# Patient Record
Sex: Female | Born: 1970 | Hispanic: No | Marital: Married | Smoking: Never smoker
Health system: Southern US, Community
[De-identification: ages and names within clinical notes are randomized; demographics above are authoritative.]

## PROBLEM LIST (undated history)

## (undated) DIAGNOSIS — E559 Vitamin D deficiency, unspecified: Secondary | ICD-10-CM

## (undated) DIAGNOSIS — E538 Deficiency of other specified B group vitamins: Secondary | ICD-10-CM

## (undated) HISTORY — DX: Vitamin D deficiency, unspecified: E55.9

## (undated) HISTORY — PX: INNER EAR SURGERY: SHX679

## (undated) HISTORY — DX: Deficiency of other specified B group vitamins: E53.8

---

## 1998-03-19 ENCOUNTER — Inpatient Hospital Stay (HOSPITAL_COMMUNITY): Admission: AD | Admit: 1998-03-19 | Discharge: 1998-03-21 | Payer: Self-pay | Admitting: Obstetrics and Gynecology

## 1998-03-19 ENCOUNTER — Inpatient Hospital Stay (HOSPITAL_COMMUNITY): Admission: AD | Admit: 1998-03-19 | Discharge: 1998-03-19 | Payer: Self-pay | Admitting: Obstetrics and Gynecology

## 1999-11-29 ENCOUNTER — Other Ambulatory Visit: Admission: RE | Admit: 1999-11-29 | Discharge: 1999-11-29 | Payer: Self-pay | Admitting: *Deleted

## 2000-06-11 ENCOUNTER — Inpatient Hospital Stay (HOSPITAL_COMMUNITY): Admission: AD | Admit: 2000-06-11 | Discharge: 2000-06-13 | Payer: Self-pay | Admitting: Obstetrics and Gynecology

## 2002-01-21 ENCOUNTER — Other Ambulatory Visit: Admission: RE | Admit: 2002-01-21 | Discharge: 2002-01-21 | Payer: Self-pay | Admitting: Obstetrics and Gynecology

## 2002-08-05 ENCOUNTER — Inpatient Hospital Stay (HOSPITAL_COMMUNITY): Admission: AD | Admit: 2002-08-05 | Discharge: 2002-08-05 | Payer: Self-pay | Admitting: Obstetrics and Gynecology

## 2002-10-29 ENCOUNTER — Inpatient Hospital Stay (HOSPITAL_COMMUNITY): Admission: AD | Admit: 2002-10-29 | Discharge: 2002-10-31 | Payer: Self-pay | Admitting: Obstetrics and Gynecology

## 2003-10-25 ENCOUNTER — Other Ambulatory Visit: Admission: RE | Admit: 2003-10-25 | Discharge: 2003-10-25 | Payer: Self-pay | Admitting: Obstetrics and Gynecology

## 2004-06-21 ENCOUNTER — Inpatient Hospital Stay (HOSPITAL_COMMUNITY): Admission: AD | Admit: 2004-06-21 | Discharge: 2004-06-23 | Payer: Self-pay | Admitting: Obstetrics and Gynecology

## 2005-05-21 ENCOUNTER — Emergency Department (HOSPITAL_COMMUNITY): Admission: EM | Admit: 2005-05-21 | Discharge: 2005-05-21 | Payer: Self-pay | Admitting: Emergency Medicine

## 2005-11-29 ENCOUNTER — Other Ambulatory Visit: Admission: RE | Admit: 2005-11-29 | Discharge: 2005-11-29 | Payer: Self-pay | Admitting: Obstetrics and Gynecology

## 2006-01-08 ENCOUNTER — Ambulatory Visit (HOSPITAL_COMMUNITY): Admission: RE | Admit: 2006-01-08 | Discharge: 2006-01-08 | Payer: Self-pay | Admitting: Obstetrics and Gynecology

## 2016-07-16 ENCOUNTER — Ambulatory Visit: Payer: Self-pay | Admitting: Certified Nurse Midwife

## 2016-08-07 ENCOUNTER — Encounter: Payer: Self-pay | Admitting: Obstetrics & Gynecology

## 2016-08-07 ENCOUNTER — Ambulatory Visit (INDEPENDENT_AMBULATORY_CARE_PROVIDER_SITE_OTHER): Payer: Medicaid Other | Admitting: Obstetrics & Gynecology

## 2016-08-07 VITALS — BP 111/77 | HR 77 | Temp 98.3°F | Ht 66.0 in | Wt 168.0 lb

## 2016-08-07 DIAGNOSIS — N8110 Cystocele, unspecified: Secondary | ICD-10-CM | POA: Diagnosis not present

## 2016-08-07 DIAGNOSIS — N816 Rectocele: Secondary | ICD-10-CM | POA: Diagnosis not present

## 2016-08-07 DIAGNOSIS — Z3009 Encounter for other general counseling and advice on contraception: Secondary | ICD-10-CM | POA: Diagnosis not present

## 2016-08-07 DIAGNOSIS — N819 Female genital prolapse, unspecified: Secondary | ICD-10-CM

## 2016-08-07 DIAGNOSIS — IMO0002 Reserved for concepts with insufficient information to code with codable children: Secondary | ICD-10-CM

## 2016-08-07 DIAGNOSIS — IMO0001 Reserved for inherently not codable concepts without codable children: Secondary | ICD-10-CM

## 2016-08-07 NOTE — Progress Notes (Signed)
GYNECOLOGY CONSULT NOTE  History:  45 y.o. [id number] here today for two issues: 1) Possible prolapse of uterus. Patient has history of term SVD x 10, last one was 2 years ago. Since last delivery, she reports feeling a lot of pressure in her vaginal area and is concerned about her "uterus falling down".  She desires surgical management of this. No problems urinating or defecating, but feels very uncomfortable, especially during intercourse.  She denies any abnormal vaginal discharge, bleeding, pelvic pain or other concerns.  2) Undesired fertility: Desires sterilization. Using condoms currently.  No past medical history on file.  No past surgical history on file.  The following portions of the patient's history were reviewed and updated as appropriate: allergies, current medications, past family history, past medical history, past social history, past surgical history and problem list.   Health Maintenance:  Normal pap during her pregnancy 2 years ago in [place].  Review of Systems:  Pertinent items noted in HPI and remainder of comprehensive ROS otherwise negative.  Objective:  Physical Exam BP 111/77   Pulse 77   Temp 98.3 F (36.8 C) (Oral)   Ht 5\' 6"  (1.676 m)   Wt 168 lb (76.2 kg)   LMP 08/01/2016 (Approximate)   BMI 27.12 kg/m  CONSTITUTIONAL: Well-developed, well-nourished female in no acute distress.  HENT:  Normocephalic, atraumatic. External right and left ear normal. Oropharynx is clear and moist EYES: Conjunctivae and EOM are normal. Pupils are equal, round, and reactive to light. No scleral icterus.  NECK: Normal range of motion, supple, no masses SKIN: Skin is warm and dry. No rash noted. Not diaphoretic. No erythema. No pallor. NEUROLOGIC: Alert and oriented to person, place, and time. Normal reflexes, muscle tone coordination. No cranial nerve deficit noted. PSYCHIATRIC: Normal mood and affect. Normal behavior. Normal judgment and thought  content. CARDIOVASCULAR: Normal heart rate noted RESPIRATORY: Effort and breath sounds normal, no problems with respiration noted ABDOMEN: Soft, no distention noted.  MUSCULOSKELETAL: Normal range of motion. No edema noted.  PELVIC: Normal appearing external genitalia but Grade 3 rectocele immediately noted.  On speculum exam, Grade 1 cystocele also noted, no significant uterine prolapse.  Introitus is widened due to rectocele.  No abnormal discharge noted.  Normal uterine size, no other palpable masses, no uterine or adnexal tenderness.   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 Cystocele Explained pelvic organ prolapse in detail, discussed etiology as being due to pregnancy and childbirth. Showed patient images on computer to aid in her understanding. Given extent of her pelvic organ prolapse and her symptoms, recommended specialist referral to Dr. Maryland Pink, Urogynecology for surgical consultation. - Ambulatory referral to Urogynecology  4. Consultation for female sterilization Explained that if hysterectomy was going to be part of her pelvic organ prolapse surgery, tubal sterilization will not be needed. However, if hysterectomy will not be performed, laparoscopic BTS can be done at the same time to avoid having two separate OR dates and two separate episodes of general anesthesia.  Can coordinate with Dr. Zigmund Daniel if surgery is performed here at [facility] and I can do the BTS part. But if surgery is done in [facility], Dr. Zigmund Daniel or another generalist colleague can do the sterilization part. Will follow up Dr. West Pugh recommendations. Medicaid papers signed today in office.    Please refer to After Visit Summary for other counseling recommendations.    Total face-to-face time with patient: 30 minutes. 100% of encounter  was spent on counseling and coordination of care.   Verita Schneiders, MD, [place] Attending [place], [facility] for [employer], [place]

## 2016-08-07 NOTE — Patient Instructions (Signed)
RECTOCELE  Pelvic Organ Prolapse Pelvic organ prolapse is the stretching, bulging, or dropping of pelvic organs into an abnormal position. It happens when the muscles and tissues that surround and support pelvic structures are stretched or weak. Pelvic organ prolapse can involve:  Vagina (vaginal prolapse).  Uterus (uterine prolapse).  Bladder (cystocele). SMALL  Rectum (rectocele). BIG  Intestines (enterocele). When organs other than the vagina are involved, they often bulge into the vagina or protrude from the vagina, depending on how severe the prolapse is. CAUSES Causes of this condition include:  Pregnancy, labor, and childbirth.  Long-lasting (chronic) cough.  Chronic constipation.  Obesity.  Past pelvic surgery.  Aging. During and after menopause, a decreased production of the hormone estrogen can weaken pelvic ligaments and muscles.  Consistently lifting more than 50 lb (23 kg).  Buildup of fluid in the abdomen due to certain diseases and other conditions. SYMPTOMS Symptoms of this condition include:  Loss of bladder control when you cough, sneeze, strain, and exercise (stress incontinence). This Linda Villanueva be worse immediately following childbirth, and it Linda Villanueva gradually improve over time.  Feeling pressure in your pelvis or vagina. This pressure Linda Villanueva increase when you cough or when you are having a bowel movement.  A bulge that protrudes from the opening of your vagina or against your vaginal wall. If your uterus protrudes through the opening of your vagina and rubs against your clothing, you Linda Villanueva also experience soreness, ulcers, infection, pain, and bleeding.  Increased effort to have a bowel movement or urinate.  Pain in your low back.  Pain, discomfort, or disinterest in sexual intercourse.  Repeated bladder infections (urinary tract infections).  Difficulty inserting or inability to insert a tampon or applicator. In some people, this condition does not cause any  symptoms. DIAGNOSIS Your health care provider Linda Villanueva perform an internal and external vaginal and rectal exam. During the exam, you Linda Villanueva be asked to cough and strain while you are lying down, sitting, and standing up. Your health care provider will determine if other tests are required, such as bladder function tests. TREATMENT In most cases, this condition needs to be treated only if it produces symptoms. No treatment is guaranteed to correct the prolapse or relieve the symptoms completely. Treatment Linda Villanueva include:  Lifestyle changes, such as:  Avoiding drinking beverages that contain caffeine.  Increasing your intake of high-fiber foods. This can help to decrease constipation and straining during bowel movements.  Emptying your bladder at scheduled times (bladder training therapy). This can help to reduce or avoid urinary incontinence.  Losing weight if you are overweight or obese.  Estrogen. Estrogen Linda Villanueva help mild prolapse by increasing the strength and tone of pelvic floor muscles.  Kegel exercises. These Linda Villanueva help mild cases of prolapse by strengthening and tightening the muscles of the pelvic floor.  Pessary insertion. A pessary is a soft, flexible device that is placed into your vagina by your health care provider to help support the vaginal walls and keep pelvic organs in place.  Surgery. This is often the only form of treatment for severe prolapse. Different types of surgeries are available. HOME CARE INSTRUCTIONS  Wear a sanitary pad or absorbent product if you have urinary incontinence.  Avoid heavy lifting and straining with exercise and work. Do not hold your breath when you perform mild to moderate lifting and exercise activities. Limit your activities as directed by your health care provider.  Take medicines only as directed by your health care provider.  Perform Kegel exercises  as directed by your health care provider.  If you have a pessary, take care of it as directed by  your health care provider. SEEK MEDICAL CARE IF:  Your symptoms interfere with your daily activities or sex life.  You need medicine to help with the discomfort.  You notice bleeding from the vagina that is not related to your period.  You have a fever.  You have pain or bleeding when you urinate.  You have bleeding when you have a bowel movement.  You lose urine when you have sex.  You have chronic constipation.  You have a pessary that falls out.  You have vaginal discharge that has a bad smell.  You have low abdominal pain or cramping that is unusual for you.   This information is not intended to replace advice given to you by your health care provider. Make sure you discuss any questions you have with your health care provider.   Document Released: 06/01/2014 Document Reviewed: 06/01/2014 Elsevier Interactive Patient Education [employer].     Laparoscopic Tubal Ligation Laparoscopic tubal ligation is a procedure that closes the fallopian tubes at a time other than right after childbirth. When the fallopian tubes are closed, the eggs that are released from the ovaries cannot enter the uterus, and sperm cannot reach the egg. Tubal ligation is also known as getting your "tubes tied." Tubal ligation is done so you will not be able to get pregnant or have a baby. Although this procedure Linda Villanueva be undone (reversed), it should be considered permanent and irreversible. If you want to have future pregnancies, you should not have this procedure. LET Linda Villanueva CARE PROVIDER KNOW ABOUT:  Any allergies you have.  All medicines you are taking, including vitamins, herbs, eye drops, creams, and over-the-counter medicines. This includes any use of steroids, either by mouth or in cream form.  Previous problems you or members of your family have had with the use of anesthetics.  Any blood disorders you have.  Previous surgeries you have had.  Any medical conditions you Linda Villanueva  have.  Possibility of pregnancy, if this applies.  Any past pregnancies. RISKS AND COMPLICATIONS  Infection.  Bleeding.  Injury to surrounding organs.  Side effects from anesthetics.  Failure of the procedure.  Ectopic pregnancy.  Future regret about having the procedure done. BEFORE THE PROCEDURE  Ask your health care provider about:  Changing or stopping your regular medicines. This is especially important if you are taking diabetes medicines or blood thinners.  Taking medicines such as aspirin and ibuprofen. These medicines can thin your blood. Do not take these medicines before your procedure if your health care provider instructs you not to.  Follow instructions from your health care provider about eating and drinking restrictions.  Plan to have someone take you home after the procedure.  If you go home right after the procedure, plan to have someone with you for 24 hours. PROCEDURE  You will be given one or more of the following:  A medicine that helps you relax (sedative).  A medicine that numbs the area (local anesthetic).  A medicine that makes you fall asleep (general anesthetic).  A medicine that is injected into an area of your body that numbs everything below the injection site (regional anesthetic).  If you have been given general anesthetic, a tube will be put down your throat to help you breathe.  Two small cuts (incisions) will be made in the lower abdominal area and near the  belly button.  Your bladder Linda Villanueva be emptied with a small tube (catheter).  Your abdomen will be inflated with a safe gas (carbon dioxide). This will help to give the surgeon room to operate and visualize, and it will help the surgeon to avoid other organs.  A thin, lighted tube (laparoscope) with a camera attached will be inserted into your abdomen through one of the incisions near the belly button. Other small instruments will be inserted through the other abdominal  incision.  The fallopian tubes will be tied off or burned (cauterized), or they will be blocked with a clip, ring, or clamp. In many cases, a small portion in the center of each fallopian tube will also be removed.  After the fallopian tubes are blocked, the gas will be released from the abdomen.  The incisions will be closed with stitches (sutures).  A bandage (dressing) will be placed over the incisions. The procedure Linda Villanueva vary among health care providers and hospitals. AFTER THE PROCEDURE  Your blood pressure, heart rate, breathing rate, and blood oxygen level will be monitored often until the medicines you were given have worn off.  You will be given pain medicine as needed.  If you had general anesthetic, you Linda Villanueva have some mild discomfort in your throat. This is from the breathing tube that was placed in your throat while you were sleeping.  You Linda Villanueva experience discomfort in the shoulder area from some trapped air between your liver and your diaphragm. This sensation is normal, and it will slowly go away on its own.  You will have some mild abdominal discomfort for 3--7 days.   This information is not intended to replace advice given to you by your health care provider. Make sure you discuss any questions you have with your health care provider.   Document Released: 02/10/2001 Document Revised: 03/21/2015 Document Reviewed: 02/15/2012 Elsevier Interactive Patient Education [employer].

## 2016-09-18 DIAGNOSIS — N393 Stress incontinence (female) (male): Secondary | ICD-10-CM | POA: Insufficient documentation

## 2016-09-18 DIAGNOSIS — Z30430 Encounter for insertion of intrauterine contraceptive device: Secondary | ICD-10-CM | POA: Insufficient documentation

## 2017-02-28 DIAGNOSIS — H40033 Anatomical narrow angle, bilateral: Secondary | ICD-10-CM | POA: Diagnosis not present

## 2017-02-28 DIAGNOSIS — H16223 Keratoconjunctivitis sicca, not specified as Sjogren's, bilateral: Secondary | ICD-10-CM | POA: Diagnosis not present

## 2017-03-05 ENCOUNTER — Encounter (HOSPITAL_COMMUNITY): Payer: Self-pay | Admitting: Emergency Medicine

## 2017-03-05 ENCOUNTER — Ambulatory Visit (HOSPITAL_COMMUNITY)
Admission: EM | Admit: 2017-03-05 | Discharge: 2017-03-05 | Disposition: A | Discharge status: Home / Self Care | Payer: Medicaid Other

## 2017-03-05 DIAGNOSIS — R112 Nausea with vomiting, unspecified: Secondary | ICD-10-CM

## 2017-03-05 DIAGNOSIS — Z3202 Encounter for pregnancy test, result negative: Secondary | ICD-10-CM | POA: Diagnosis not present

## 2017-03-05 DIAGNOSIS — B9681 Helicobacter pylori [H. pylori] as the cause of diseases classified elsewhere: Secondary | ICD-10-CM | POA: Diagnosis not present

## 2017-03-05 DIAGNOSIS — K253 Acute gastric ulcer without hemorrhage or perforation: Secondary | ICD-10-CM

## 2017-03-05 DIAGNOSIS — R109 Unspecified abdominal pain: Secondary | ICD-10-CM | POA: Diagnosis not present

## 2017-03-05 LAB — POCT URINALYSIS DIP (DEVICE)
BILIRUBIN URINE: NEGATIVE
GLUCOSE, UA: NEGATIVE mg/dL
Ketones, ur: NEGATIVE mg/dL
LEUKOCYTES UA: NEGATIVE
NITRITE: NEGATIVE
Protein, ur: NEGATIVE mg/dL
Specific Gravity, Urine: 1.025 (ref 1.005–1.030)
UROBILINOGEN UA: 0.2 mg/dL (ref 0.0–1.0)
pH: 7 (ref 5.0–8.0)

## 2017-03-05 LAB — POCT PREGNANCY, URINE: PREG TEST UR: NEGATIVE

## 2017-03-05 MED ORDER — OMEPRAZOLE 20 MG PO CPDR: 20.0000 mg | DELAYED_RELEASE_CAPSULE | Freq: Two times a day (BID) | ORAL | 1 refills | Status: DC

## 2017-03-05 MED ORDER — KETOROLAC TROMETHAMINE 30 MG/ML IJ SOLN
INTRAMUSCULAR | Status: AC
  Filled 2017-03-05: qty 1

## 2017-03-05 MED ORDER — ONDANSETRON HCL 4 MG/2ML IJ SOLN
4.0000 mg | Freq: Once | INTRAMUSCULAR | Status: AC
  Administered 2017-03-05: 4 mg via INTRAMUSCULAR

## 2017-03-05 MED ORDER — KETOROLAC TROMETHAMINE 30 MG/ML IJ SOLN
30.0000 mg | Freq: Once | INTRAMUSCULAR | Status: AC
  Administered 2017-03-05: 30 mg via INTRAMUSCULAR

## 2017-03-05 MED ORDER — CLARITHROMYCIN 500 MG PO TABS: 500.0000 mg | ORAL_TABLET | Freq: Two times a day (BID) | ORAL | 0 refills | Status: AC

## 2017-03-05 MED ORDER — AMOXICILLIN 500 MG PO CAPS: 1000.0000 mg | ORAL_CAPSULE | Freq: Two times a day (BID) | ORAL | 0 refills | Status: AC

## 2017-03-05 MED ORDER — ONDANSETRON HCL 4 MG/2ML IJ SOLN
INTRAMUSCULAR | Status: AC
  Filled 2017-03-05: qty 2

## 2017-03-05 NOTE — ED Provider Notes (Signed)
CSN: [id number]     Arrival date & time 03/05/17  1632 History   First MD Initiated Contact with Patient 03/05/17 1727     Chief Complaint  Patient presents with  . Abdominal Pain   (Consider location/radiation/quality/duration/timing/severity/associated sxs/prior Treatment) 46 year old female presents with epigastric abdominal pain that started 2 days ago. Has become more severe in the past 24 hours with nausea, vomiting and diarrhea. Denies any fever, dysuria, unusual vaginal discharge or blood in her stool or vomit. She does have a history of a stomach ulcer about 2 years ago and symptoms are similar to that episode. Has not been able to keep any fluids down since this morning. Pain level is 9/10 and she is hunched over due to the pain. She has not tried any medication for symptoms. No other family members are ill. She is accompanied by her husband who is assisting with translation as needed.    The history is provided by the patient and the spouse.    History reviewed. No pertinent past medical history. History reviewed. No pertinent surgical history. No family history on file. Social History  Substance Use Topics  . Smoking status: Never Smoker  . Smokeless tobacco: Not on file  . Alcohol use No   OB History    Gravida Para Term Preterm AB Living   10 10 10  0 0 0   SAB TAB Ectopic Multiple Live Births   0 0 0 0 0     Review of Systems  Constitutional: Positive for appetite change and fatigue. Negative for chills and fever.  HENT: Negative for congestion and sore throat.   Respiratory: Negative for cough, chest tightness, shortness of breath and wheezing.   Cardiovascular: Negative for chest pain.  Gastrointestinal: Positive for abdominal pain, diarrhea, nausea and vomiting. Negative for blood in stool, constipation and rectal pain.  Genitourinary: Negative for dysuria, flank pain and vaginal discharge.  Musculoskeletal: Negative for arthralgias, back pain, myalgias and neck  pain.  Skin: Negative for rash and wound.  Allergic/Immunologic: Negative for environmental allergies, food allergies and immunocompromised state.  Neurological: Negative for dizziness, syncope, weakness, light-headedness and headaches.  Hematological: Negative for adenopathy.    Allergies  Patient has no known allergies.  Home Medications   Prior to Admission medications   Medication Sig Start Date End Date Taking? Authorizing Provider  Multiple Vitamin (MULTIVITAMIN) tablet Take 1 tablet by mouth daily.   Yes Historical Provider, MD  amoxicillin (AMOXIL) 500 MG capsule Take 2 capsules (1,000 mg total) by mouth 2 (two) times daily. 03/05/17 03/19/17  Katy Apo, NP  clarithromycin (BIAXIN) 500 MG tablet Take 1 tablet (500 mg total) by mouth 2 (two) times daily. 03/05/17 03/19/17  Katy Apo, NP  omeprazole (PRILOSEC) 20 MG capsule Take 1 capsule (20 mg total) by mouth 2 (two) times daily before a meal. 03/05/17 03/19/17  Katy Apo, NP   Meds Ordered and Administered this Visit   Medications  ondansetron [facility]) injection 4 mg (4 mg Intramuscular Given 03/05/17 1808)  ketorolac (TORADOL) 30 MG/ML injection 30 mg (30 mg Intramuscular Given 03/05/17 1809)    BP 126/82 (BP Location: Left Arm)   Pulse 76   Temp 97.9 F (36.6 C) (Oral)   Resp 18   LMP 02/20/2017   SpO2 100%  No data found.   Physical Exam  Constitutional: She is oriented to person, place, and time. She appears well-developed and well-nourished. She appears ill (Patient is hunched over due  to pain). She appears distressed.  HENT:  Head: Normocephalic and atraumatic.  Right Ear: Hearing normal.  Left Ear: Hearing normal.  Nose: Nose normal.  Mouth/Throat: Uvula is midline, oropharynx is clear and moist and mucous membranes are normal.  Eyes: Conjunctivae and EOM are normal. Pupils are equal, round, and reactive to light.  Neck: Normal range of motion. Neck supple.  Cardiovascular: Normal rate, regular  rhythm and normal heart sounds.   No murmur heard. Pulmonary/Chest: Effort normal and breath sounds normal. No respiratory distress. She has no wheezes.  Abdominal: Soft. Normal appearance. She exhibits no distension, no abdominal bruit, no ascites and no mass. Bowel sounds are increased. There is no hepatosplenomegaly. There is tenderness in the epigastric area. There is no rigidity, no rebound, no guarding and no CVA tenderness.  Musculoskeletal: Normal range of motion.  Lymphadenopathy:    She has no cervical adenopathy.  Neurological: She is alert and oriented to person, place, and time.  Skin: Skin is warm and dry.  Psychiatric: She has a normal mood and affect. Her behavior is normal.    Urgent Care Course     Procedures (including critical care time)  Labs Review Labs Reviewed  POCT URINALYSIS DIP (DEVICE) - Abnormal; Notable for the following:       Result Value   Hgb urine dipstick MODERATE (*)    All other components within normal limits  POCT PREGNANCY, URINE    Imaging Review No results found.   Visual Acuity Review  Right Eye Distance:   Left Eye Distance:   Bilateral Distance:    Right Eye Near:   Left Eye Near:    Bilateral Near:         MDM   1. Acute H. pylori gastric ulcer    Reviewed negative pregnancy test with patient and husband. Discussed urinalysis results. Reviewed positive H pylori test result- discussed gastric ulcer and treatment. Due to nausea, gave Zofran 4mg  IM today. Also due to pain and unable to keep oral medication down, gave Toradol 30mg  IM now. Recommend triple therapy- start Biaxin 500mg  twice a day, Amoxicillin 1g twice a day and Prilosec 20mg  twice a day. Also discussed using Peptobismul up to 4 times a day to help with nausea and diarrhea. Recommend contact [facility] tomorrow to schedule follow-up appointment or go to ER if pain or symptoms do not improve or worsens.     Katy Apo, NP 03/06/17 [phone]

## 2017-03-05 NOTE — ED Triage Notes (Signed)
Patient had abdominal pain that started a few days ago, significantly worsened today.  Patient is having vomiting and diarrhea.

## 2017-03-05 NOTE — Discharge Instructions (Signed)
You were given a shot of Zofran for nausea today as well as Toradol for pain. Recommend start antibiotics for bacterial stomach infection. Start Clarithromycin 500mg  twice a day and Amoxicillin 2 tablets twice a day. Also take Prilosec 20mg  capsule twice a day. Elvera also use Peptobismul to help coat stomach lining to help with pain. Recommend call [facility] office tomorrow to schedule follow-up appointment or go to ER if pain or symptoms worsen.

## 2017-03-06 ENCOUNTER — Emergency Department (HOSPITAL_COMMUNITY)
Admission: EM | Admit: 2017-03-06 | Discharge: 2017-03-06 | Disposition: A | Discharge status: Home / Self Care | Payer: Medicaid Other | Attending: Emergency Medicine | Admitting: Emergency Medicine

## 2017-03-06 ENCOUNTER — Encounter (HOSPITAL_COMMUNITY): Payer: Self-pay | Admitting: *Deleted

## 2017-03-06 DIAGNOSIS — Z79899 Other long term (current) drug therapy: Secondary | ICD-10-CM | POA: Diagnosis not present

## 2017-03-06 DIAGNOSIS — R1013 Epigastric pain: Secondary | ICD-10-CM | POA: Diagnosis not present

## 2017-03-06 DIAGNOSIS — R112 Nausea with vomiting, unspecified: Secondary | ICD-10-CM | POA: Insufficient documentation

## 2017-03-06 LAB — COMPREHENSIVE METABOLIC PANEL
ALT: 14 U/L (ref 14–54)
AST: 22 U/L (ref 15–41)
Albumin: 3.9 g/dL (ref 3.5–5.0)
Alkaline Phosphatase: 85 U/L (ref 38–126)
Anion gap: 8 (ref 5–15)
BILIRUBIN TOTAL: 1.1 mg/dL (ref 0.3–1.2)
BUN: 9 mg/dL (ref 6–20)
CHLORIDE: 105 mmol/L (ref 101–111)
CO2: 26 mmol/L (ref 22–32)
CREATININE: 0.66 mg/dL (ref 0.44–1.00)
Calcium: 9.1 mg/dL (ref 8.9–10.3)
Glucose, Bld: 117 mg/dL — ABNORMAL HIGH (ref 65–99)
POTASSIUM: 3.8 mmol/L (ref 3.5–5.1)
Sodium: 139 mmol/L (ref 135–145)
TOTAL PROTEIN: 7.7 g/dL (ref 6.5–8.1)

## 2017-03-06 LAB — URINALYSIS, ROUTINE W REFLEX MICROSCOPIC
Bacteria, UA: NONE SEEN
Bilirubin Urine: NEGATIVE
GLUCOSE, UA: NEGATIVE mg/dL
Ketones, ur: 20 mg/dL — AB
Leukocytes, UA: NEGATIVE
NITRITE: NEGATIVE
PH: 6 (ref 5.0–8.0)
Protein, ur: 30 mg/dL — AB
SPECIFIC GRAVITY, URINE: 1.024 (ref 1.005–1.030)

## 2017-03-06 LAB — I-STAT BETA HCG BLOOD, ED (MC, WL, AP ONLY): I-stat hCG, quantitative: <5 m[IU]/mL (ref <5)

## 2017-03-06 LAB — CBC WITH DIFFERENTIAL/PLATELET
Basophils Absolute: 0 10*3/uL (ref 0.0–0.1)
Basophils Relative: 0 %
EOS ABS: 0 10*3/uL (ref 0.0–0.7)
Eosinophils Relative: 0 %
HEMATOCRIT: 39.2 % (ref 36.0–46.0)
HEMOGLOBIN: 12.8 g/dL (ref 12.0–15.0)
LYMPHS ABS: 2.4 10*3/uL (ref 0.7–4.0)
LYMPHS PCT: 20 %
MCH: 26.4 pg (ref 26.0–34.0)
MCHC: 32.7 g/dL (ref 30.0–36.0)
MCV: 81 fL (ref 78.0–100.0)
MONOS PCT: 5 %
Monocytes Absolute: 0.6 10*3/uL (ref 0.1–1.0)
NEUTROS ABS: 9.2 10*3/uL — AB (ref 1.7–7.7)
NEUTROS PCT: 75 %
Platelets: 401 10*3/uL — ABNORMAL HIGH (ref 150–400)
RBC: 4.84 MIL/uL (ref 3.87–5.11)
RDW: 14.9 % (ref 11.5–15.5)
WBC: 12.1 10*3/uL — AB (ref 4.0–10.5)

## 2017-03-06 LAB — LIPASE, BLOOD: LIPASE: 27 U/L (ref 11–51)

## 2017-03-06 MED ORDER — ONDANSETRON 4 MG PO TBDP
4.0000 mg | ORAL_TABLET | Freq: Once | ORAL | Status: AC
  Administered 2017-03-06: 4 mg via ORAL
  Filled 2017-03-06: qty 1

## 2017-03-06 MED ORDER — ONDANSETRON 4 MG PO TBDP: 4.0000 mg | ORAL_TABLET | Freq: Three times a day (TID) | ORAL | 0 refills | Status: AC | PRN

## 2017-03-06 NOTE — ED Triage Notes (Signed)
Pt reports mid abd pain that became severe yesterday. Was seen at [facility] yesterday and started on prescriptions for bacteria. Pt having n/v/d and unable to keep meds down.

## 2017-03-06 NOTE — ED Provider Notes (Signed)
[facility] DEPT Provider Note   CSN: [id number] Arrival date & time: 03/06/17  1220     History   Chief Complaint Chief Complaint  Patient presents with  . Abdominal Pain  . Emesis    HPI Linda Villanueva is a 46 y.o. female.  The history is provided by the patient.  Abdominal Pain   This is a recurrent (on going for several years.) problem. Episode onset: this episode about 3 days. Episode frequency: intermittent. The problem has been gradually worsening. The pain is associated with eating. The pain is located in the epigastric region and RUQ. The pain is moderate. Associated symptoms include diarrhea (intermittent that has been on going for several months), nausea and vomiting. Pertinent negatives include fever, melena and dysuria. The symptoms are aggravated by eating and palpation. Nothing relieves the symptoms. Her past medical history is significant for PUD (told she had H. pylori in [place]). Past medical history comments: s/p cholecystectomy. .  Emesis   Associated symptoms include abdominal pain and diarrhea (intermittent that has been on going for several months). Pertinent negatives include no fever.    Patient and the husband reported that she's not been able to keep her medicine down due to her nausea and vomiting.  History reviewed. No pertinent past medical history.  There are no active problems to display for this patient.   History reviewed. No pertinent surgical history.  OB History    Gravida Para Term Preterm AB Living   10 10 10  0 0 0   SAB TAB Ectopic Multiple Live Births   0 0 0 0 0       Home Medications    Prior to Admission medications   Medication Sig Start Date End Date Taking? Authorizing Provider  amoxicillin (AMOXIL) 500 MG capsule Take 2 capsules (1,000 mg total) by mouth 2 (two) times daily. 03/05/17 03/19/17 Yes Katy Apo, NP  clarithromycin (BIAXIN) 500 MG tablet Take 1 tablet (500 mg total) by mouth 2 (two) times daily. 03/05/17  03/19/17 Yes Katy Apo, NP  Multiple Vitamin (MULTIVITAMIN) tablet Take 1 tablet by mouth daily.   Yes Historical Provider, MD  omeprazole (PRILOSEC) 20 MG capsule Take 1 capsule (20 mg total) by mouth 2 (two) times daily before a meal. 03/05/17 03/19/17 Yes Katy Apo, NP  ondansetron (ZOFRAN ODT) 4 MG disintegrating tablet Take 1 tablet (4 mg total) by mouth every 8 (eight) hours as needed for nausea or vomiting. 03/06/17 03/09/17  Fatima Blank, MD    Family History History reviewed. No pertinent family history.  Social History Social History  Substance Use Topics  . Smoking status: Never Smoker  . Smokeless tobacco: Not on file  . Alcohol use No     Allergies   Patient has no known allergies.   Review of Systems Review of Systems  Constitutional: Negative for fever.  Gastrointestinal: Positive for abdominal pain, diarrhea (intermittent that has been on going for several months), nausea and vomiting. Negative for melena.  Genitourinary: Negative for dysuria.  All other systems are reviewed and are negative for acute change except as noted in the HPI    Physical Exam Updated Vital Signs BP 123/86   Pulse 79   Temp 97.9 F (36.6 C) (Oral)   Resp 18   LMP 02/20/2017   SpO2 98%   Physical Exam  Constitutional: She is oriented to person, place, and time. She appears well-developed and well-nourished. No distress.  HENT:  Head: Normocephalic and  atraumatic.  Nose: Nose normal.  Eyes: Conjunctivae and EOM are normal. Pupils are equal, round, and reactive to light. Right eye exhibits no discharge. Left eye exhibits no discharge. No scleral icterus.  Neck: Normal range of motion. Neck supple.  Cardiovascular: Normal rate and regular rhythm.  Exam reveals no gallop and no friction rub.   No murmur heard. Pulmonary/Chest: Effort normal and breath sounds normal. No stridor. No respiratory distress. She has no rales.  Abdominal: Soft. She exhibits no distension.  There is tenderness in the right upper quadrant and epigastric area. There is no rigidity, no rebound and no guarding.  Musculoskeletal: She exhibits no edema or tenderness.  Neurological: She is alert and oriented to person, place, and time.  Skin: Skin is warm and dry. No rash noted. She is not diaphoretic. No erythema.  Psychiatric: She has a normal mood and affect.  Vitals reviewed.    ED Treatments / Results  Labs (all labs ordered are listed, but only abnormal results are displayed) Labs Reviewed  COMPREHENSIVE METABOLIC PANEL - Abnormal; Notable for the following:       Result Value   Glucose, Bld 117 (*)    All other components within normal limits  URINALYSIS, ROUTINE W REFLEX MICROSCOPIC - Abnormal; Notable for the following:    APPearance HAZY (*)    Hgb urine dipstick MODERATE (*)    Ketones, ur 20 (*)    Protein, ur 30 (*)    Squamous Epithelial / LPF 0-5 (*)    All other components within normal limits  CBC WITH DIFFERENTIAL/PLATELET - Abnormal; Notable for the following:    WBC 12.1 (*)    Platelets 401 (*)    Neutro Abs 9.2 (*)    All other components within normal limits  LIPASE, BLOOD  I-STAT BETA HCG BLOOD, ED ([facility], [facility], [facility] ONLY)    EKG  EKG Interpretation None       Radiology No results found.  Procedures Procedures (including critical care time)  Medications Ordered in ED Medications  ondansetron (ZOFRAN-ODT) disintegrating tablet 4 mg (4 mg Oral Given 03/06/17 1630)     Initial Impression / Assessment and Plan / ED Course  I have reviewed the triage vital signs and the nursing notes.  Pertinent labs & imaging results that were available during my care of the patient were reviewed by me and considered in my medical decision making (see chart for details).     Epigastric abdominal pain. Patient is currently being treated for H. pylori. On review of the records I do not see a positive H. pylori test but the patient reports that she was told  previously that she had it while in [place].  Abdominal exam without evidence of peritonitis. Labs grossly reassuring without evidence to suggest pancreatitis, biliary obstruction. Likely gastritis versus peptic ulcer disease. Patient denied any melena to suggest active bleeding.   Provided with Zofran and patient was able to tolerate by mouth intake.  Improved pain as well.   There is low suspicion for other serious/emergent intra-abdominal etiologies. Do not feel that advanced imaging is warranted at this time.   Feel patient is safe for discharge with strict return precautions and close PCP follow-up.  Final Clinical Impressions(s) / ED Diagnoses   Final diagnoses:  Epigastric pain  Nausea and vomiting, intractability of vomiting not specified, unspecified vomiting type   Disposition: Discharge  Condition: Good  I have discussed the results, Dx and Tx plan with the patient who expressed understanding  and agree(s) with the plan. Discharge instructions discussed at great length. The patient was given strict return precautions who verbalized understanding of the instructions. No further questions at time of discharge.    New Prescriptions   ONDANSETRON (ZOFRAN ODT) 4 MG DISINTEGRATING TABLET    Take 1 tablet (4 mg total) by mouth every 8 (eight) hours as needed for nausea or vomiting.    Follow Up: primary care provider  Schedule an appointment as soon as possible for a visit        Fatima Blank, MD 03/06/17 [phone]

## 2017-03-06 NOTE — ED Notes (Signed)
Pt ambulated to restroom, pt had no complaints at this time.

## 2017-03-07 LAB — POCT H PYLORI SCREEN: H. PYLORI SCREEN, POC: POSITIVE — AB

## 2017-11-13 ENCOUNTER — Other Ambulatory Visit (HOSPITAL_COMMUNITY)
Admission: RE | Admit: 2017-11-13 | Discharge: 2017-11-13 | Disposition: A | Discharge status: Home / Self Care | Payer: Medicaid Other | Source: Ambulatory Visit

## 2017-11-13 ENCOUNTER — Ambulatory Visit: Payer: Medicaid Other

## 2017-11-13 VITALS — BP 112/72 | HR 69 | Wt 166.8 lb

## 2017-11-13 DIAGNOSIS — B372 Candidiasis of skin and nail: Secondary | ICD-10-CM | POA: Diagnosis not present

## 2017-11-13 DIAGNOSIS — Z975 Presence of (intrauterine) contraceptive device: Secondary | ICD-10-CM | POA: Insufficient documentation

## 2017-11-13 DIAGNOSIS — Z01419 Encounter for gynecological examination (general) (routine) without abnormal findings: Secondary | ICD-10-CM | POA: Insufficient documentation

## 2017-11-13 DIAGNOSIS — Z0000 Encounter for general adult medical examination without abnormal findings: Secondary | ICD-10-CM | POA: Diagnosis not present

## 2017-11-13 MED ORDER — FLUCONAZOLE 150 MG PO TABS: 150.0000 mg | ORAL_TABLET | Freq: Once | ORAL | 0 refills | Status: AC

## 2017-11-13 MED ORDER — HYDROCORTISONE 0.5 % EX CREA: 1.0000 "application " | TOPICAL_CREAM | Freq: Two times a day (BID) | CUTANEOUS | 0 refills | Status: DC

## 2017-11-13 NOTE — Patient Instructions (Signed)
Diphenhydramine cream- for itching  Ask primary doctor for referral to dermatology

## 2017-11-13 NOTE — Progress Notes (Signed)
GYNECOLOGY ANNUAL PREVENTATIVE CARE ENCOUNTER NOTE  Subjective:   Linda Villanueva is a 46 y.o. [id number] female here for a routine annual gynecologic exam.  Current complaints: external itching around vagina.   Denies abnormal vaginal bleeding, discharge, pelvic pain, problems with intercourse or other gynecologic concerns.    Gynecologic History Patient's last menstrual period was 11/04/2017. Contraception: IUD Last Pap: unsure, from [place] and states it was a long time ago.  Obstetric History OB History  Gravida Para Term Preterm AB Living  10 10 10  0 0 10  SAB TAB Ectopic Multiple Live Births  0 0 0 0 10    # Outcome Date GA Lbr Len/2nd Weight Sex Delivery Anes PTL Lv  10 Term           9 Term           8 Term           7 Term           6 Term           5 Term           4 Term           3 Term           2 Term           1 Term               History reviewed. No pertinent past medical history.  History reviewed. No pertinent surgical history.  Current Outpatient Medications on File Prior to Visit  Medication Sig Dispense Refill  . Cholecalciferol (VITAMIN D-1000 MAX ST) 1000 units tablet Take by mouth.    . Multiple Vitamin (MULTIVITAMIN) tablet Take 1 tablet by mouth daily.    Marland Kitchen neomycin-polymyxin b-dexamethasone (MAXITROL) 3.5-10000-0.1 OINT APPLY INCH INTO  LOWER EYELIDS  OF BOTH EYES BEFORE  BEDTIME  3  . PARoxetine (PAXIL) 10 MG tablet TK 1 T PO QD  1  . RESTASIS MULTIDOSE 0.05 % ophthalmic emulsion USE 1 GTT BID BOTH EYES  3  . omeprazole (PRILOSEC) 20 MG capsule Take 1 capsule (20 mg total) by mouth 2 (two) times daily before a meal. 28 capsule 1   No current facility-administered medications on file prior to visit.     No Known Allergies  Social History   Socioeconomic History  . Marital status: Married    Spouse name: Not on file  . Number of children: Not on file  . Years of education: Not on file  . Highest education level: Not on file  Social  Needs  . Financial resource strain: Not on file  . Food insecurity - worry: Not on file  . Food insecurity - inability: Not on file  . Transportation needs - medical: Not on file  . Transportation needs - non-medical: Not on file  Occupational History  . Not on file  Tobacco Use  . Smoking status: Never Smoker  . Smokeless tobacco: Never Used  Substance and Sexual Activity  . Alcohol use: No  . Drug use: No  . Sexual activity: Yes    Partners: Male    Birth control/protection: IUD  Other Topics Concern  . Not on file  Social History Narrative  . Not on file    Family History  Problem Relation Age of Onset  . Hypertension Mother   . Hypertension Father    The following portions of the patient's history were reviewed and updated as  appropriate: allergies, current medications, past family history, past medical history, past social history, past surgical history and problem list.  Review of Systems Pertinent items noted in HPI and remainder of comprehensive ROS otherwise negative.   Objective:  BP 112/72   Pulse 69   Wt 166 lb 12.8 oz (75.7 kg)   LMP 11/04/2017   BMI 26.92 kg/m  CONSTITUTIONAL: Well-developed, well-nourished female in no acute distress.  HENT:  Normocephalic, atraumatic, External right and left ear normal. Oropharynx is clear and moist EYES: Conjunctivae and EOM are normal. Pupils are equal, round, and reactive to light. No scleral icterus.  NECK: Normal range of motion, supple, no masses.  Normal thyroid.  SKIN: Skin is warm and dry. White scaly rash noted near crease in neck. Not diaphoretic. No erythema. No pallor. NEUROLOGIC: Alert and oriented to person, place, and time. Normal reflexes, muscle tone coordination. No cranial nerve deficit noted. PSYCHIATRIC: Normal mood and affect. Normal behavior. Normal judgment and thought content. CARDIOVASCULAR: Normal heart rate noted, regular rhythm RESPIRATORY: Clear to auscultation bilaterally. Effort and  breath sounds normal, no problems with respiration noted. BREASTS: Symmetric in size. No masses, skin changes, nipple drainage, or lymphadenopathy. ABDOMEN: Soft, normal bowel sounds, no distention noted.  No tenderness, rebound or guarding.  PELVIC: Normal appearing external genitalia; erythema noted at groin between thighs bilaterally, normal appearing vaginal mucosa and cervix.  No abnormal discharge noted.  Pap smear obtained. IUD strings visualized.  Normal uterine size, no other palpable masses, no uterine or adnexal tenderness. MUSCULOSKELETAL: Normal range of motion. No tenderness.  No cyanosis, clubbing, or edema.  2+ distal pulses.   Assessment and Plan:  1. Encounter for well woman exam with routine gynecological exam - Cervicovaginal ancillary only - HgB A1c - HIV antibody (with reflex)  2. Pap test, as part of routine gynecological examination - Cytology - PAP  3. Yeast infection of the skin -Redness and itching of external skin, patient reports second time this has happened this year and diflucan resolves symptoms.  - fluconazole (DIFLUCAN) 150 MG tablet; Take 1 tablet (150 mg total) by mouth once for 1 dose.  Dispense: 1 tablet; Refill: 0 - hydrocortisone cream 0.5 %; Apply 1 application topically 2 (two) times daily.  Dispense: 30 g; Refill: 0  Will follow up results of pap smear and manage accordingly. Routine preventative health maintenance measures emphasized. Please refer to After Visit Summary for other counseling recommendations.   Wende Mott, CNM 11/13/17 12:13 PM

## 2017-11-13 NOTE — Progress Notes (Signed)
Pt also complains of having vaginal itching.

## 2017-11-14 LAB — CERVICOVAGINAL ANCILLARY ONLY
Bacterial vaginitis: NEGATIVE
Candida vaginitis: NEGATIVE

## 2017-11-14 LAB — HEMOGLOBIN A1C
ESTIMATED AVERAGE GLUCOSE: 108 mg/dL
Hgb A1c MFr Bld: 5.4 % (ref 4.8–5.6)

## 2017-11-14 LAB — HIV ANTIBODY (ROUTINE TESTING W REFLEX): HIV SCREEN 4TH GENERATION: NONREACTIVE

## 2017-11-19 LAB — CYTOLOGY - PAP
Diagnosis: NEGATIVE
HPV (WINDOPATH): NOT DETECTED

## 2017-12-19 ENCOUNTER — Encounter (HOSPITAL_COMMUNITY): Payer: Self-pay | Admitting: Emergency Medicine

## 2017-12-19 ENCOUNTER — Other Ambulatory Visit: Payer: Self-pay

## 2017-12-19 ENCOUNTER — Emergency Department (HOSPITAL_COMMUNITY)
Admission: EM | Admit: 2017-12-19 | Discharge: 2017-12-20 | Disposition: A | Discharge status: Home / Self Care | Payer: Medicaid Other | Attending: Emergency Medicine | Admitting: Emergency Medicine

## 2017-12-19 DIAGNOSIS — R1013 Epigastric pain: Secondary | ICD-10-CM | POA: Diagnosis not present

## 2017-12-19 DIAGNOSIS — Z79899 Other long term (current) drug therapy: Secondary | ICD-10-CM | POA: Diagnosis not present

## 2017-12-19 LAB — LIPASE, BLOOD: Lipase: 29 U/L (ref 11–51)

## 2017-12-19 LAB — URINALYSIS, ROUTINE W REFLEX MICROSCOPIC
Bilirubin Urine: NEGATIVE
Glucose, UA: NEGATIVE mg/dL
Ketones, ur: 20 mg/dL — AB
Leukocytes, UA: NEGATIVE
NITRITE: NEGATIVE
Protein, ur: NEGATIVE mg/dL
SPECIFIC GRAVITY, URINE: 1.026 (ref 1.005–1.030)
pH: 5 (ref 5.0–8.0)

## 2017-12-19 LAB — COMPREHENSIVE METABOLIC PANEL
ALBUMIN: 3.8 g/dL (ref 3.5–5.0)
ALT: 13 U/L — ABNORMAL LOW (ref 14–54)
ANION GAP: 11 (ref 5–15)
AST: 16 U/L (ref 15–41)
Alkaline Phosphatase: 90 U/L (ref 38–126)
BILIRUBIN TOTAL: 0.5 mg/dL (ref 0.3–1.2)
BUN: 8 mg/dL (ref 6–20)
CHLORIDE: 103 mmol/L (ref 101–111)
CO2: 24 mmol/L (ref 22–32)
Calcium: 9.1 mg/dL (ref 8.9–10.3)
Creatinine, Ser: 0.55 mg/dL (ref 0.44–1.00)
GFR calc Af Amer: >60 mL/min (ref >60)
GFR calc non Af Amer: >60 mL/min (ref >60)
GLUCOSE: 108 mg/dL — AB (ref 65–99)
POTASSIUM: 4.1 mmol/L (ref 3.5–5.1)
SODIUM: 138 mmol/L (ref 135–145)
Total Protein: 7.1 g/dL (ref 6.5–8.1)

## 2017-12-19 LAB — I-STAT BETA HCG BLOOD, ED (MC, WL, AP ONLY)

## 2017-12-19 LAB — CBC
HEMATOCRIT: 38.9 % (ref 36.0–46.0)
HEMOGLOBIN: 12.6 g/dL (ref 12.0–15.0)
MCH: 26.8 pg (ref 26.0–34.0)
MCHC: 32.4 g/dL (ref 30.0–36.0)
MCV: 82.8 fL (ref 78.0–100.0)
Platelets: 352 10*3/uL (ref 150–400)
RBC: 4.7 MIL/uL (ref 3.87–5.11)
RDW: 14.8 % (ref 11.5–15.5)
WBC: 10.6 10*3/uL — ABNORMAL HIGH (ref 4.0–10.5)

## 2017-12-19 MED ORDER — GI COCKTAIL ~~LOC~~
30.0000 mL | Freq: Once | ORAL | Status: AC
  Administered 2017-12-19: 30 mL via ORAL
  Filled 2017-12-19: qty 30

## 2017-12-19 MED ORDER — SUCRALFATE 1 G PO TABS
1.0000 g | ORAL_TABLET | Freq: Once | ORAL | Status: AC
  Administered 2017-12-20: 1 g via ORAL
  Filled 2017-12-19: qty 1

## 2017-12-19 MED ORDER — ONDANSETRON 4 MG PO TBDP
4.0000 mg | ORAL_TABLET | Freq: Once | ORAL | Status: AC | PRN
  Administered 2017-12-19: 4 mg via ORAL
  Filled 2017-12-19: qty 1

## 2017-12-19 MED ORDER — ONDANSETRON 4 MG PO TBDP
4.0000 mg | ORAL_TABLET | Freq: Once | ORAL | Status: AC
  Administered 2017-12-20: 4 mg via ORAL
  Filled 2017-12-19: qty 1

## 2017-12-19 NOTE — ED Provider Notes (Signed)
[facility] EMERGENCY DEPARTMENT Provider Note   CSN: [id number] Arrival date & time: 12/19/17  1958     History   Chief Complaint Chief Complaint  Patient presents with  . Abdominal Pain  . Nausea    HPI Piper H Fultz is a 47 y.o. female.  HPI   47 year old female presenting c/o abd pain.  Patient is Arabic speaking but able to answer questions appropriately.  Her husband who is at bedside also help with translating.  Patient admits to having history of acid reflux and stomach ulcer.  She having been taking her medication for the past 6 months.  Today she developed pain to her epigastric region.  She described pain as a burning stabbing sensation, nonradiating, with associated nausea and she vomited twice without any blood or mucus.  She feels that symptoms worse when she lays down.  Rates pain as 8 out of 10 and constant.  Patient admits to eating a lot of spicy food but denies any NSAIDs use or alcohol use.  No fever, headache, chest pain, shortness of breath, URI symptoms, constipation or diarrhea.  states that her vomitus feels acidic.  She denies any change in his stool, no black tarry stool or blood in the stool.     History reviewed. No pertinent past medical history.  There are no active problems to display for this patient.   History reviewed. No pertinent surgical history.  OB History    Gravida Para Term Preterm AB Living   10 10 10  0 0 10   SAB TAB Ectopic Multiple Live Births   0 0 0 0 10       Home Medications    Prior to Admission medications   Medication Sig Start Date End Date Taking? Authorizing Provider  Cyanocobalamin (VITAMIN B-12 PO) Take 200 mcg by mouth daily.   Yes [provider]  cycloSPORINE (RESTASIS MULTIDOSE) 0.05 % ophthalmic emulsion Place 1 drop into both eyes 2 (two) times daily.   Yes [provider]  hydrocortisone cream 0.5 % Apply 1 application topically 2 (two) times daily. Patient not taking:  Reported on 12/19/2017 11/13/17   Wende Mott, CNM    Family History Family History  Problem Relation Age of Onset  . Hypertension Mother   . Hypertension Father     Social History Social History   Tobacco Use  . Smoking status: Never Smoker  . Smokeless tobacco: Never Used  Substance Use Topics  . Alcohol use: No  . Drug use: No     Allergies   Patient has no known allergies.   Review of Systems Review of Systems  All other systems reviewed and are negative.    Physical Exam Updated Vital Signs BP 132/81   Pulse 76   Temp 98.1 F (36.7 C) (Oral)   Resp 16   Ht 5\' 6"  (1.676 m)   Wt 74.8 kg (165 lb)   LMP 12/03/2017 (Exact Date)   SpO2 100%   BMI 26.63 kg/m   Physical Exam  Constitutional: She appears well-developed and well-nourished. No distress.  HENT:  Head: Atraumatic.  Eyes: Conjunctivae are normal.  Neck: Neck supple.  Cardiovascular: Normal rate and regular rhythm.  Pulmonary/Chest: Effort normal and breath sounds normal.  Abdominal: Soft. Normal appearance and bowel sounds are normal. There is tenderness in the epigastric area. There is no rigidity, no CVA tenderness, no tenderness at McBurney's point and negative Murphy's sign.  Neurological: She is alert.  Skin: No rash noted.  Psychiatric: She has a normal mood and affect.  Nursing note and vitals reviewed.    ED Treatments / Results  Labs (all labs ordered are listed, but only abnormal results are displayed) Labs Reviewed  COMPREHENSIVE METABOLIC PANEL - Abnormal; Notable for the following components:      Result Value   Glucose, Bld 108 (*)    ALT 13 (*)    All other components within normal limits  CBC - Abnormal; Notable for the following components:   WBC 10.6 (*)    All other components within normal limits  URINALYSIS, ROUTINE W REFLEX MICROSCOPIC - Abnormal; Notable for the following components:   APPearance HAZY (*)    Hgb urine dipstick SMALL (*)    Ketones, ur 20  (*)    Bacteria, UA RARE (*)    Squamous Epithelial / LPF 0-5 (*)    All other components within normal limits  LIPASE, BLOOD  I-STAT BETA HCG BLOOD, ED ([facility], [facility], [facility] ONLY)    EKG  EKG Interpretation None       Radiology No results found.  Procedures Procedures (including critical care time)  Medications Ordered in ED Medications  ondansetron (ZOFRAN-ODT) disintegrating tablet 4 mg (4 mg Oral Given 12/19/17 2045)     Initial Impression / Assessment and Plan / ED Course  I have reviewed the triage vital signs and the nursing notes.  Pertinent labs & imaging results that were available during my care of the patient were reviewed by me and considered in my medical decision making (see chart for details).     BP 133/80   Pulse 65   Temp 98.1 F (36.7 C) (Oral)   Resp 16   Ht 5\' 6"  (1.676 m)   Wt 74.8 kg (165 lb)   LMP 12/03/2017 (Exact Date)   SpO2 100%   BMI 26.63 kg/m    Final Clinical Impressions(s) / ED Diagnoses   Final diagnoses:  Epigastric abdominal pain    ED Discharge Orders        Ordered    omeprazole (PRILOSEC) 20 MG capsule  2 times daily before meals     12/20/17 0028    ranitidine (ZANTAC) 150 MG capsule  2 times daily     12/20/17 0028    promethazine (PHENERGAN) 25 MG tablet  Every 6 hours PRN     12/20/17 0028     12:22 AM Patient with epigastric abdominal pain, admits to eating spicy food and has history of reflux and peptic ulcer disease.  She does have some mild tenderness on palpation of her epigastric but no right upper quadrant tenderness to suggest biliary disease.  Labs are reassuring, normal lipase, and vital sign stable.  Currently sxs not suggestive of cardiopulmonary etiology.  Pt given GI cocktail and report improvement of sxs.  Plan to d/c with PPI/H2 blocker along with antinausea medication.  Encourage avoidance of spicy food.  Return precaution given.     Domenic Moras, PA-C 66/06/30 1601    Delora Fuel, MD 09/32/35  [phone]

## 2017-12-19 NOTE — ED Notes (Signed)
Pt remains in waiting room. Updated on wait for treatment room. 

## 2017-12-19 NOTE — ED Triage Notes (Signed)
Pt to ED with c/o epigastric pain and nausea onset 3 hours ago.  Pt denies diarrhea

## 2017-12-20 MED ORDER — PROMETHAZINE HCL 25 MG PO TABS: 25.0000 mg | ORAL_TABLET | Freq: Four times a day (QID) | ORAL | 0 refills | Status: DC | PRN

## 2017-12-20 MED ORDER — RANITIDINE HCL 150 MG PO CAPS: 150.0000 mg | ORAL_CAPSULE | Freq: Two times a day (BID) | ORAL | 0 refills | Status: DC

## 2017-12-20 MED ORDER — OMEPRAZOLE 20 MG PO CPDR: 20.0000 mg | DELAYED_RELEASE_CAPSULE | Freq: Two times a day (BID) | ORAL | 0 refills | Status: DC

## 2017-12-31 ENCOUNTER — Encounter (HOSPITAL_COMMUNITY): Payer: Self-pay | Admitting: Emergency Medicine

## 2017-12-31 ENCOUNTER — Other Ambulatory Visit: Payer: Self-pay

## 2017-12-31 ENCOUNTER — Ambulatory Visit (HOSPITAL_COMMUNITY)
Admission: EM | Admit: 2017-12-31 | Discharge: 2017-12-31 | Disposition: A | Discharge status: Home / Self Care | Payer: Medicaid Other | Attending: Family Medicine | Admitting: Family Medicine

## 2017-12-31 DIAGNOSIS — S96911A Strain of unspecified muscle and tendon at ankle and foot level, right foot, initial encounter: Secondary | ICD-10-CM | POA: Diagnosis not present

## 2017-12-31 DIAGNOSIS — S39012A Strain of muscle, fascia and tendon of lower back, initial encounter: Secondary | ICD-10-CM

## 2017-12-31 DIAGNOSIS — W19XXXA Unspecified fall, initial encounter: Secondary | ICD-10-CM | POA: Diagnosis not present

## 2017-12-31 MED ORDER — PREDNISONE 20 MG PO TABS: ORAL_TABLET | ORAL | 0 refills | Status: DC

## 2017-12-31 NOTE — ED Provider Notes (Addendum)
[facility]   [id number] 12/31/17 Arrival Time: 1440   SUBJECTIVE:  Linda Villanueva is a 47 y.o. female who presents to the urgent care with complaint of right sided pain after fall.  Pt states she slipped in her kitchen yesterday. She thinks she Raileigh have fallen to her right side with mild pain in her right arm.  She complains of right foot pain and woke up this morning with lower back pain.  She also has a small abrasion to her left shin.   From [place].  10 children  History reviewed. No pertinent past medical history. Family History  Problem Relation Age of Onset  . Hypertension Mother   . Hypertension Father    Social History   Socioeconomic History  . Marital status: Married    Spouse name: Not on file  . Number of children: Not on file  . Years of education: Not on file  . Highest education level: Not on file  Social Needs  . Financial resource strain: Not on file  . Food insecurity - worry: Not on file  . Food insecurity - inability: Not on file  . Transportation needs - medical: Not on file  . Transportation needs - non-medical: Not on file  Occupational History  . Not on file  Tobacco Use  . Smoking status: Never Smoker  . Smokeless tobacco: Never Used  Substance and Sexual Activity  . Alcohol use: No  . Drug use: No  . Sexual activity: Yes    Partners: Male    Birth control/protection: IUD  Other Topics Concern  . Not on file  Social History Narrative  . Not on file   Current Meds  Medication Sig  . omeprazole (PRILOSEC) 20 MG capsule Take 1 capsule (20 mg total) by mouth 2 (two) times daily before a meal.   No Known Allergies    ROS: As per HPI, remainder of ROS negative.   OBJECTIVE:   Vitals:   12/31/17 1535  BP: 131/65  Pulse: 66  Temp: 98.3 F (36.8 C)  TempSrc: Oral  SpO2: 100%     General appearance: alert; no distress Eyes: PERRL; EOMI; conjunctiva normal HENT: normocephalic; atraumatic; TMs normal, canal normal,  external ears normal without trauma; nasal mucosa normal; oral mucosa normal Neck: supple Back: no CVA tenderness, mildly tender upper sacral part of low back Extremities: no cyanosis or edema; symmetrical with no gross deformities Skin: warm and dry Neurologic: normal gait; grossly normal Psychological: alert and cooperative; normal mood and affect      Labs:  Results for orders placed or performed during the hospital encounter of 12/19/17  Lipase, blood  Result Value Ref Range   Lipase 29 11 - 51 U/L  Comprehensive metabolic panel  Result Value Ref Range   Sodium 138 135 - 145 mmol/L   Potassium 4.1 3.5 - 5.1 mmol/L   Chloride 103 101 - 111 mmol/L   CO2 24 22 - 32 mmol/L   Glucose, Bld 108 (H) 65 - 99 mg/dL   BUN 8 6 - 20 mg/dL   Creatinine, Ser 0.55 0.44 - 1.00 mg/dL   Calcium 9.1 8.9 - 10.3 mg/dL   Total Protein 7.1 6.5 - 8.1 g/dL   Albumin 3.8 3.5 - 5.0 g/dL   AST 16 15 - 41 U/L   ALT 13 (L) 14 - 54 U/L   Alkaline Phosphatase 90 38 - 126 U/L   Total Bilirubin 0.5 0.3 - 1.2 mg/dL   GFR calc  non Af Amer >60 >60 mL/min   GFR calc Af Amer >60 >60 mL/min   Anion gap 11 5 - 15  CBC  Result Value Ref Range   WBC 10.6 (H) 4.0 - 10.5 K/uL   RBC 4.70 3.87 - 5.11 MIL/uL   Hemoglobin 12.6 12.0 - 15.0 g/dL   HCT 38.9 36.0 - 46.0 %   MCV 82.8 78.0 - 100.0 fL   MCH 26.8 26.0 - 34.0 pg   MCHC 32.4 30.0 - 36.0 g/dL   RDW 14.8 11.5 - 15.5 %   Platelets 352 150 - 400 K/uL  Urinalysis, Routine w reflex microscopic  Result Value Ref Range   Color, Urine YELLOW YELLOW   APPearance HAZY (A) CLEAR   Specific Gravity, Urine 1.026 1.005 - 1.030   pH 5.0 5.0 - 8.0   Glucose, UA NEGATIVE NEGATIVE mg/dL   Hgb urine dipstick SMALL (A) NEGATIVE   Bilirubin Urine NEGATIVE NEGATIVE   Ketones, ur 20 (A) NEGATIVE mg/dL   Protein, ur NEGATIVE NEGATIVE mg/dL   Nitrite NEGATIVE NEGATIVE   Leukocytes, UA NEGATIVE NEGATIVE   RBC / HPF 0-5 0 - 5 RBC/hpf   WBC, UA 0-5 0 - 5 WBC/hpf    Bacteria, UA RARE (A) NONE SEEN   Squamous Epithelial / LPF 0-5 (A) NONE SEEN   Mucus PRESENT   I-Stat beta hCG blood, ED  Result Value Ref Range   I-stat hCG, quantitative <5.0 <5 mIU/mL   Comment 3            Labs Reviewed - No data to display  No results found.     ASSESSMENT & PLAN:  1. Strain of right foot, initial encounter   2. Strain of lumbar region, initial encounter   3. Fall, initial encounter     Meds ordered this encounter  Medications  . predniSONE (DELTASONE) 20 MG tablet    Sig: one daily with food    Dispense:  5 tablet    Refill:  0    Reviewed expectations re: course of current medical issues. Questions answered. Outlined signs and symptoms indicating need for more acute intervention. Patient verbalized understanding. After Visit Summary given.    Procedures:      Robyn Haber, MD 12/31/17 6144    Robyn Haber, MD 12/31/17 [phone]

## 2017-12-31 NOTE — Discharge Instructions (Signed)
Please return if not improving in several days

## 2017-12-31 NOTE — ED Triage Notes (Signed)
Pt states she slipped in her kitchen yesterday. She thinks she Linda Villanueva have fallen to her right side with mild pain in her right arm.  She complains of right foot pain and woke up this morning with lower back pain.  She also has a small abrasion to her left shin.

## 2018-01-29 DIAGNOSIS — K219 Gastro-esophageal reflux disease without esophagitis: Secondary | ICD-10-CM | POA: Diagnosis not present

## 2018-01-29 DIAGNOSIS — R1013 Epigastric pain: Secondary | ICD-10-CM | POA: Diagnosis not present

## 2018-06-03 ENCOUNTER — Ambulatory Visit (HOSPITAL_COMMUNITY)
Admission: EM | Admit: 2018-06-03 | Discharge: 2018-06-03 | Disposition: A | Discharge status: Home / Self Care | Payer: Medicaid Other | Attending: Internal Medicine | Admitting: Internal Medicine

## 2018-06-03 ENCOUNTER — Other Ambulatory Visit: Payer: Self-pay

## 2018-06-03 ENCOUNTER — Encounter (HOSPITAL_COMMUNITY): Payer: Self-pay | Admitting: Emergency Medicine

## 2018-06-03 ENCOUNTER — Telehealth (HOSPITAL_COMMUNITY): Payer: Self-pay | Admitting: Emergency Medicine

## 2018-06-03 ENCOUNTER — Emergency Department (HOSPITAL_COMMUNITY)
Admission: EM | Admit: 2018-06-03 | Discharge: 2018-06-04 | Disposition: A | Discharge status: Home / Self Care | Payer: Medicaid Other | Attending: Emergency Medicine | Admitting: Emergency Medicine

## 2018-06-03 ENCOUNTER — Encounter (HOSPITAL_COMMUNITY): Payer: Self-pay

## 2018-06-03 DIAGNOSIS — M5416 Radiculopathy, lumbar region: Secondary | ICD-10-CM | POA: Diagnosis not present

## 2018-06-03 DIAGNOSIS — F419 Anxiety disorder, unspecified: Secondary | ICD-10-CM | POA: Diagnosis not present

## 2018-06-03 DIAGNOSIS — Z3202 Encounter for pregnancy test, result negative: Secondary | ICD-10-CM

## 2018-06-03 DIAGNOSIS — R1013 Epigastric pain: Secondary | ICD-10-CM

## 2018-06-03 DIAGNOSIS — R112 Nausea with vomiting, unspecified: Secondary | ICD-10-CM | POA: Diagnosis not present

## 2018-06-03 DIAGNOSIS — Z79899 Other long term (current) drug therapy: Secondary | ICD-10-CM | POA: Insufficient documentation

## 2018-06-03 DIAGNOSIS — K2900 Acute gastritis without bleeding: Secondary | ICD-10-CM | POA: Diagnosis not present

## 2018-06-03 DIAGNOSIS — K210 Gastro-esophageal reflux disease with esophagitis: Secondary | ICD-10-CM | POA: Diagnosis not present

## 2018-06-03 DIAGNOSIS — R110 Nausea: Secondary | ICD-10-CM

## 2018-06-03 DIAGNOSIS — K219 Gastro-esophageal reflux disease without esophagitis: Secondary | ICD-10-CM | POA: Diagnosis not present

## 2018-06-03 LAB — CBC
HEMATOCRIT: 39.2 % (ref 36.0–46.0)
HEMOGLOBIN: 12.4 g/dL (ref 12.0–15.0)
MCH: 26.9 pg (ref 26.0–34.0)
MCHC: 31.6 g/dL (ref 30.0–36.0)
MCV: 85 fL (ref 78.0–100.0)
PLATELETS: 368 10*3/uL (ref 150–400)
RBC: 4.61 MIL/uL (ref 3.87–5.11)
RDW: 14.4 % (ref 11.5–15.5)
WBC: 9.8 10*3/uL (ref 4.0–10.5)

## 2018-06-03 LAB — URINALYSIS, ROUTINE W REFLEX MICROSCOPIC
GLUCOSE, UA: NEGATIVE mg/dL
HGB URINE DIPSTICK: NEGATIVE
Ketones, ur: 80 mg/dL — AB
LEUKOCYTES UA: NEGATIVE
Nitrite: NEGATIVE
PH: 5 (ref 5.0–8.0)
Protein, ur: 30 mg/dL — AB
Specific Gravity, Urine: 1.034 — ABNORMAL HIGH (ref 1.005–1.030)

## 2018-06-03 LAB — LIPASE, BLOOD: Lipase: 31 U/L (ref 11–51)

## 2018-06-03 LAB — POCT URINALYSIS DIP (DEVICE)
BILIRUBIN URINE: NEGATIVE
Glucose, UA: NEGATIVE mg/dL
Ketones, ur: NEGATIVE mg/dL
LEUKOCYTES UA: NEGATIVE
NITRITE: NEGATIVE
Protein, ur: NEGATIVE mg/dL
Specific Gravity, Urine: >=1.03 (ref 1.005–1.030)
Urobilinogen, UA: 0.2 mg/dL (ref 0.0–1.0)
pH: 5.5 (ref 5.0–8.0)

## 2018-06-03 LAB — COMPREHENSIVE METABOLIC PANEL
ALBUMIN: 3.5 g/dL (ref 3.5–5.0)
ALT: 12 U/L (ref 0–44)
AST: 14 U/L — ABNORMAL LOW (ref 15–41)
Alkaline Phosphatase: 78 U/L (ref 38–126)
Anion gap: 8 (ref 5–15)
BUN: 7 mg/dL (ref 6–20)
CHLORIDE: 107 mmol/L (ref 98–111)
CO2: 23 mmol/L (ref 22–32)
Calcium: 8.7 mg/dL — ABNORMAL LOW (ref 8.9–10.3)
Creatinine, Ser: 0.61 mg/dL (ref 0.44–1.00)
GFR calc Af Amer: >60 mL/min (ref >60)
GFR calc non Af Amer: >60 mL/min (ref >60)
GLUCOSE: 114 mg/dL — AB (ref 70–99)
POTASSIUM: 3.9 mmol/L (ref 3.5–5.1)
SODIUM: 138 mmol/L (ref 135–145)
Total Bilirubin: 0.5 mg/dL (ref 0.3–1.2)
Total Protein: 6.9 g/dL (ref 6.5–8.1)

## 2018-06-03 LAB — POCT PREGNANCY, URINE: PREG TEST UR: NEGATIVE

## 2018-06-03 LAB — I-STAT BETA HCG BLOOD, ED (MC, WL, AP ONLY): I-stat hCG, quantitative: <5 m[IU]/mL (ref <5)

## 2018-06-03 MED ORDER — PROMETHAZINE HCL 25 MG PO TABS: 25.0000 mg | ORAL_TABLET | Freq: Four times a day (QID) | ORAL | 0 refills | Status: DC | PRN

## 2018-06-03 MED ORDER — OMEPRAZOLE 20 MG PO CPDR: 40.0000 mg | DELAYED_RELEASE_CAPSULE | Freq: Every day | ORAL | 1 refills | Status: DC

## 2018-06-03 MED ORDER — GI COCKTAIL ~~LOC~~
30.0000 mL | Freq: Once | ORAL | Status: AC
  Administered 2018-06-03: 30 mL via ORAL

## 2018-06-03 MED ORDER — GI COCKTAIL ~~LOC~~
ORAL | Status: AC
  Filled 2018-06-03: qty 30

## 2018-06-03 MED ORDER — RANITIDINE HCL 150 MG PO CAPS: 150.0000 mg | ORAL_CAPSULE | Freq: Every day | ORAL | 0 refills | Status: DC

## 2018-06-03 NOTE — ED Notes (Signed)
Results reviewed.  No changes in acuity at this time 

## 2018-06-03 NOTE — Telephone Encounter (Signed)
Increasing abdominal pain.  Reviewed Loura Pardon, NP's note with Greig Right, PA.  Notation states go to ED for worsening pain.  Wieters, [place] in agreement.    Patient agreed to ED visit.

## 2018-06-03 NOTE — ED Provider Notes (Signed)
[place]    CSN: [id number] Arrival date & time: 06/03/18  1447     History   Chief Complaint Chief Complaint  Patient presents with  . Abdominal Pain    HPI Linda Villanueva is a 47 y.o. female.   Pt is a 47 year old female with past medical history of H. pylori and GERD.  She is here with epigastric pain, nausea, burning that started this a.m.  The pain has been constant and worsening with worsening nausea.  She has not tried anything for her symptoms.  She has been prescribed omeprazole, Zantac, Phenergan in the past for the similar symptoms.  She admits to not taking the omeprazole consistently.  She also admits to eating a lot of spicy foods.  She denies any vomiting, diarrhea, constipation, back pain, chest pain, shortness of breath, fevers.  ROS per HPI      History reviewed. No pertinent past medical history.  There are no active problems to display for this patient.   History reviewed. No pertinent surgical history.  OB History    Gravida  10   Para  10   Term  10   Preterm  0   AB  0   Living  10     SAB  0   TAB  0   Ectopic  0   Multiple  0   Live Births  10            Home Medications    Prior to Admission medications   Medication Sig Start Date End Date Taking? Authorizing Provider  predniSONE (DELTASONE) 20 MG tablet one daily with food 12/31/17  Yes Lauenstein, Synetta Shadow, MD  Cyanocobalamin (VITAMIN B-12 PO) Take 200 mcg by mouth daily.    [provider]  cycloSPORINE (RESTASIS MULTIDOSE) 0.05 % ophthalmic emulsion Place 1 drop into both eyes 2 (two) times daily.    [provider]  hydrocortisone cream 0.5 % Apply 1 application topically 2 (two) times daily. Patient not taking: Reported on 12/19/2017 11/13/17   Wende Mott, CNM  omeprazole (PRILOSEC) 20 MG capsule Take 2 capsules (40 mg total) by mouth daily. 06/03/18   Orvan July, NP  promethazine (PHENERGAN) 25 MG tablet Take 1 tablet (25 mg  total) by mouth every 6 (six) hours as needed for nausea or vomiting. 06/03/18   Loura Halt A, NP  ranitidine (ZANTAC) 150 MG capsule Take 1 capsule (150 mg total) by mouth daily. 06/03/18   Orvan July, NP    Family History Family History  Problem Relation Age of Onset  . Hypertension Mother   . Hypertension Father     Social History Social History   Tobacco Use  . Smoking status: Never Smoker  . Smokeless tobacco: Never Used  Substance Use Topics  . Alcohol use: No  . Drug use: No     Allergies   Patient has no known allergies.   Review of Systems Review of Systems   Physical Exam Triage Vital Signs ED Triage Vitals  Enc Vitals Group     BP 06/03/18 1522 133/87     Pulse Rate 06/03/18 1522 78     Resp 06/03/18 1522 20     Temp 06/03/18 1522 98.7 F (37.1 C)     Temp Source 06/03/18 1522 Oral     SpO2 06/03/18 1522 97 %     Weight --      Height --  Head Circumference --      Peak Flow --      Pain Score 06/03/18 1529 8     Pain Loc --      Pain Edu? --      Excl. in [place]? --    No data found.  Updated Vital Signs BP 133/87 (BP Location: Right Arm)   Pulse 78   Temp 98.7 F (37.1 C) (Oral)   Resp 20   LMP 05/01/2018   SpO2 97%   Visual Acuity Right Eye Distance:   Left Eye Distance:   Bilateral Distance:    Right Eye Near:   Left Eye Near:    Bilateral Near:     Physical Exam  Constitutional: She is oriented to person, place, and time. She appears well-developed and well-nourished.  HENT:  Head: Normocephalic and atraumatic.  Cardiovascular: Normal rate and regular rhythm.  Pulmonary/Chest: Effort normal and breath sounds normal.  Abdominal: Soft. Normal appearance and bowel sounds are normal. She exhibits no shifting dullness, no distension, no pulsatile liver, no fluid wave, no abdominal bruit, no ascites, no pulsatile midline mass and no mass. There is no hepatosplenomegaly, splenomegaly or hepatomegaly. There is tenderness. There is  no rigidity, no rebound, no guarding, no CVA tenderness, no tenderness at McBurney's point and negative Murphy's sign. No hernia.  Tenderness to palpation of epigastric area.  Neurological: She is alert and oriented to person, place, and time.  Skin: Skin is warm and dry. Capillary refill takes less than 2 seconds.  Psychiatric: She has a normal mood and affect.     UC Treatments / Results  Labs (all labs ordered are listed, but only abnormal results are displayed) Labs Reviewed  POCT URINALYSIS DIP (DEVICE) - Abnormal; Notable for the following components:      Result Value   Hgb urine dipstick SMALL (*)    All other components within normal limits  POCT PREGNANCY, URINE    EKG None  Radiology No results found.  Procedures Procedures (including critical care time)  Medications Ordered in UC Medications  gi cocktail (Maalox,Lidocaine,Donnatal) (30 mLs Oral Given 06/03/18 1559)    Initial Impression / Assessment and Plan / UC Course  I have reviewed the triage vital signs and the nursing notes.  Pertinent labs & imaging results that were available during my care of the patient were reviewed by me and considered in my medical decision making (see chart for details).    H. pylori screening not done due to patient's previous history of positive reading.  GI cocktail given in clinic with some relief of symptoms.  Will prescribe omeprazole daily and Zantac nightly with Phenergan as needed for nausea.  She will need to follow-up with a GI doctor.  Referral given.  For worsening symptoms she will need to go to the ER. Final Clinical Impressions(s) / UC Diagnoses   Final diagnoses:  Gastroesophageal reflux disease without esophagitis     Discharge Instructions     It was nice meeting you!!  I believe this is acid reflux based on your history and symptoms.  We gave you a GI cocktail which helped.  Please start taking the omeprazole 40 mg daily. Take this 30 to 60 minutes  prior to eating with a glass of water.  I want you to also start taking zantac at night and nausea medication as needed.  You need to follow up with your GI doctor as soon as you can.  I will given you some diet  information and information about GERD that can be helpful.     ED Prescriptions    Medication Sig Dispense Auth. Provider   omeprazole (PRILOSEC) 20 MG capsule  (Status: Discontinued) Take 2 capsules (40 mg total) by mouth daily. 30 capsule Laguana Desautel A, NP   ranitidine (ZANTAC) 150 MG capsule Take 1 capsule (150 mg total) by mouth daily. 30 capsule Valgene Deloatch A, NP   promethazine (PHENERGAN) 25 MG tablet Take 1 tablet (25 mg total) by mouth every 6 (six) hours as needed for nausea or vomiting. 20 tablet Payden Docter A, NP   omeprazole (PRILOSEC) 20 MG capsule Take 2 capsules (40 mg total) by mouth daily. 60 capsule Loura Halt A, NP     Controlled Substance Prescriptions [place] Controlled Substance Registry consulted? Not Applicable   Orvan July, NP 06/03/18 1713

## 2018-06-03 NOTE — ED Triage Notes (Signed)
Pt states that she had upper abd pain that started at 11am today, went to UC and given maybe GI cocktail? Pt reports pain continues and has vomited several times since, denies fevers, diarrhea x 2

## 2018-06-03 NOTE — ED Triage Notes (Signed)
Abdominal pain started today.  Last episode was 3 months ago. Patient is feeling nauseated, denies vomiting.  Last bm was this am, normal BM

## 2018-06-03 NOTE — Discharge Instructions (Addendum)
It was nice meeting you!!  I believe this is acid reflux based on your history and symptoms.  We gave you a GI cocktail which helped.  Please start taking the omeprazole 40 mg daily. Take this 30 to 60 minutes prior to eating with a glass of water.  I want you to also start taking zantac at night and nausea medication as needed.  You need to follow up with your GI doctor as soon as you can.  I will given you some diet information and information about GERD that can be helpful.

## 2018-06-04 MED ORDER — SODIUM CHLORIDE 0.9 % IV BOLUS
2000.0000 mL | Freq: Once | INTRAVENOUS | Status: AC
  Administered 2018-06-04: 1000 mL via INTRAVENOUS

## 2018-06-04 MED ORDER — ONDANSETRON HCL 4 MG/2ML IJ SOLN
4.0000 mg | Freq: Once | INTRAMUSCULAR | Status: AC
  Administered 2018-06-04: 4 mg via INTRAVENOUS
  Filled 2018-06-04: qty 2

## 2018-06-04 MED ORDER — FAMOTIDINE IN NACL 20-0.9 MG/50ML-% IV SOLN
20.0000 mg | INTRAVENOUS | Status: AC
  Administered 2018-06-04: 20 mg via INTRAVENOUS
  Filled 2018-06-04: qty 50

## 2018-06-04 NOTE — Discharge Instructions (Signed)
Continue your prescribed Prilosec and Zantac for management of your symptoms.  You Chery also take Phenergan as previously prescribed for nausea.  Avoid spicy foods, citrus fruits, coffee, chocolate as these Charnese aggravate your symptoms.  Follow-up with your gastroenterologist for further evaluation.  You Song also benefit from primary care follow-up.  Return to the ED for new or concerning symptoms.

## 2018-06-04 NOTE — ED Provider Notes (Signed)
[place] EMERGENCY DEPARTMENT Provider Note   CSN: [id number] Arrival date & time: 06/03/18  1936     History   Chief Complaint No chief complaint on file.   HPI Dymin H Calia is a 47 y.o. female.   47 year old female with a history of reflux presents to the emergency department for evaluation of epigastric abdominal pain.  She states that pain began at 11 AM today.  It remained constant throughout much of the afternoon and became associated with nausea and vomiting.  She developed most of her vomiting after a visit to urgent care at 1430 yesterday.  She was treated with a GI cocktail during this visit, but this provided no relief.  She subsequently had multiple episodes of nonbloody, nonbilious emesis.  She reports 2 episodes of looser stool.  No fevers.  Her symptoms have started to spontaneously subside.  History of appendectomy in the past.  No other surgical history.  She is followed by gastroenterology for management of reflux and H. pylori.  She has been inconsistently taking her prescribed omeprazole.  Continues to eat spicy foods.     History reviewed. No pertinent past medical history.  There are no active problems to display for this patient.   History reviewed. No pertinent surgical history.   OB History    Gravida  10   Para  10   Term  10   Preterm  0   AB  0   Living  10     SAB  0   TAB  0   Ectopic  0   Multiple  0   Live Births  10            Home Medications    Prior to Admission medications   Medication Sig Start Date End Date Taking? Authorizing Provider  Cyanocobalamin (VITAMIN B-12 PO) Take 200 mcg by mouth 3 (three) times a week.    Yes [provider]  cycloSPORINE (RESTASIS MULTIDOSE) 0.05 % ophthalmic emulsion Place 1 drop into both eyes 2 (two) times daily.   Yes [provider]  omeprazole (PRILOSEC) 20 MG capsule Take 2 capsules (40 mg total) by mouth daily. 06/03/18  Yes Bast, Traci  A, NP  promethazine (PHENERGAN) 25 MG tablet Take 1 tablet (25 mg total) by mouth every 6 (six) hours as needed for nausea or vomiting. 06/03/18  Yes Bast, Traci A, NP  ranitidine (ZANTAC) 150 MG capsule Take 1 capsule (150 mg total) by mouth daily. 06/03/18  Yes Bast, Traci A, NP  hydrocortisone cream 0.5 % Apply 1 application topically 2 (two) times daily. Patient not taking: Reported on 12/19/2017 11/13/17   Wende Mott, CNM  predniSONE (DELTASONE) 20 MG tablet one daily with food Patient not taking: Reported on 06/04/2018 12/31/17   Robyn Haber, MD    Family History Family History  Problem Relation Age of Onset  . Hypertension Mother   . Hypertension Father     Social History Social History   Tobacco Use  . Smoking status: Never Smoker  . Smokeless tobacco: Never Used  Substance Use Topics  . Alcohol use: No  . Drug use: No     Allergies   Patient has no known allergies.   Review of Systems Review of Systems Ten systems reviewed and are negative for acute change, except as noted in the HPI.    Physical Exam Updated Vital Signs BP 104/73   Pulse (!) 56   Temp 98.7 F (  37.1 C) (Oral)   Resp 14   SpO2 100%   Physical Exam  Constitutional: She is oriented to person, place, and time. She appears well-developed and well-nourished. No distress.  Nontoxic appearing and in NAD  HENT:  Head: Normocephalic and atraumatic.  Eyes: Conjunctivae and EOM are normal. No scleral icterus.  Neck: Normal range of motion.  Cardiovascular: Normal rate, regular rhythm and intact distal pulses.  Pulmonary/Chest: Effort normal. No stridor. No respiratory distress. She has no wheezes.  Respirations even and unlabored  Abdominal: Soft. She exhibits no mass. There is no guarding.  Mild epigastric and LUQ tenderness. No guarding or peritoneal signs.  Musculoskeletal: Normal range of motion.  Neurological: She is alert and oriented to person, place, and time. She exhibits normal  muscle tone. Coordination normal.  Skin: Skin is warm and dry. No rash noted. She is not diaphoretic. No erythema. No pallor.  Psychiatric: She has a normal mood and affect. Her behavior is normal.  Nursing note and vitals reviewed.    ED Treatments / Results  Labs (all labs ordered are listed, but only abnormal results are displayed) Labs Reviewed  COMPREHENSIVE METABOLIC PANEL - Abnormal; Notable for the following components:      Result Value   Glucose, Bld 114 (*)    Calcium 8.7 (*)    AST 14 (*)    All other components within normal limits  URINALYSIS, ROUTINE W REFLEX MICROSCOPIC - Abnormal; Notable for the following components:   APPearance HAZY (*)    Specific Gravity, Urine 1.034 (*)    Bilirubin Urine SMALL (*)    Ketones, ur 80 (*)    Protein, ur 30 (*)    Bacteria, UA RARE (*)    All other components within normal limits  LIPASE, BLOOD  CBC  I-STAT BETA HCG BLOOD, ED ([facility], [facility], [facility] ONLY)    EKG None  Radiology No results found.  Procedures Procedures (including critical care time)  Medications Ordered in ED Medications  sodium chloride 0.9 % bolus 2,000 mL (0 mLs Intravenous Stopped 06/04/18 0437)  ondansetron (ZOFRAN) injection 4 mg (4 mg Intravenous Given 06/04/18 0338)  famotidine (PEPCID) IVPB 20 mg premix (0 mg Intravenous Stopped 06/04/18 0411)     Initial Impression / Assessment and Plan / ED Course  I have reviewed the triage vital signs and the nursing notes.  Pertinent labs & imaging results that were available during my care of the patient were reviewed by me and considered in my medical decision making (see chart for details).     47 year old female presents to the emergency department for epigastric pain with ongoing nausea and vomiting.  Symptoms have largely subsided spontaneously.  She has long-standing history of reflux as well as H. pylori.  The patient is actively followed by a gastroenterologist.  She reports inconsistent use of her  omeprazole.  Also endorses eating spicy foods frequently.  Laboratory work-up is reassuring today without leukocytosis.  The patient has been afebrile with stable vital signs.  No electrolyte derangements.  Liver and kidney function preserved.  Urinalysis consistent with dehydration for which the patient has been hydrated in the ED with IV fluids.  She has been able to tolerate oral fluids without additional vomiting.  States that nausea has subsided.  I do not believe further emergent work-up is indicated, but have advised outpatient GI follow-up.  Return precautions discussed and provided. Patient discharged in stable condition with no unaddressed concerns.   Final Clinical Impressions(s) / ED  Diagnoses   Final diagnoses:  Acute gastritis, presence of bleeding unspecified, unspecified gastritis type    ED Discharge Orders    None       Antonietta Breach, PA-C 71/83/67 2550    Delora Fuel, MD 01/64/29 [phone]

## 2018-06-16 DIAGNOSIS — M545 Low back pain: Secondary | ICD-10-CM | POA: Diagnosis not present

## 2018-06-17 ENCOUNTER — Ambulatory Visit: Payer: Medicaid Other | Admitting: Advanced Practice Midwife

## 2018-06-23 ENCOUNTER — Ambulatory Visit (HOSPITAL_COMMUNITY)
Admission: EM | Admit: 2018-06-23 | Discharge: 2018-06-23 | Disposition: A | Discharge status: Home / Self Care | Payer: Medicaid Other | Attending: Family Medicine | Admitting: Family Medicine

## 2018-06-23 ENCOUNTER — Other Ambulatory Visit: Payer: Self-pay

## 2018-06-23 ENCOUNTER — Encounter (HOSPITAL_COMMUNITY): Payer: Self-pay | Admitting: Emergency Medicine

## 2018-06-23 DIAGNOSIS — K2900 Acute gastritis without bleeding: Secondary | ICD-10-CM

## 2018-06-23 DIAGNOSIS — Z3202 Encounter for pregnancy test, result negative: Secondary | ICD-10-CM | POA: Diagnosis not present

## 2018-06-23 DIAGNOSIS — R112 Nausea with vomiting, unspecified: Secondary | ICD-10-CM

## 2018-06-23 DIAGNOSIS — R1013 Epigastric pain: Secondary | ICD-10-CM | POA: Diagnosis not present

## 2018-06-23 LAB — POCT URINALYSIS DIP (DEVICE)
Glucose, UA: NEGATIVE mg/dL
Ketones, ur: 80 mg/dL — AB
Leukocytes, UA: NEGATIVE
NITRITE: NEGATIVE
PH: 7 (ref 5.0–8.0)
Protein, ur: NEGATIVE mg/dL
Specific Gravity, Urine: 1.025 (ref 1.005–1.030)
Urobilinogen, UA: 0.2 mg/dL (ref 0.0–1.0)

## 2018-06-23 LAB — POCT PREGNANCY, URINE: Preg Test, Ur: NEGATIVE

## 2018-06-23 MED ORDER — ONDANSETRON HCL 4 MG/2ML IJ SOLN
INTRAMUSCULAR | Status: AC
  Filled 2018-06-23: qty 2

## 2018-06-23 MED ORDER — ONDANSETRON 4 MG PO TBDP: 4.0000 mg | ORAL_TABLET | Freq: Three times a day (TID) | ORAL | 0 refills | Status: DC | PRN

## 2018-06-23 MED ORDER — OMEPRAZOLE 20 MG PO CPDR: 40.0000 mg | DELAYED_RELEASE_CAPSULE | Freq: Every day | ORAL | 1 refills | Status: DC

## 2018-06-23 MED ORDER — FAMOTIDINE 20 MG PO TABS
ORAL_TABLET | ORAL | Status: AC
  Filled 2018-06-23: qty 1

## 2018-06-23 MED ORDER — FAMOTIDINE 20 MG PO TABS
10.0000 mg | ORAL_TABLET | Freq: Once | ORAL | Status: AC
  Administered 2018-06-23: 10 mg via ORAL

## 2018-06-23 MED ORDER — GI COCKTAIL ~~LOC~~
ORAL | Status: AC
  Filled 2018-06-23: qty 30

## 2018-06-23 MED ORDER — SUCRALFATE 1 G PO TABS: 1.0000 g | ORAL_TABLET | Freq: Three times a day (TID) | ORAL | 0 refills | Status: DC

## 2018-06-23 MED ORDER — GI COCKTAIL ~~LOC~~
30.0000 mL | Freq: Once | ORAL | Status: AC
  Administered 2018-06-23: 30 mL via ORAL

## 2018-06-23 MED ORDER — ONDANSETRON HCL 4 MG/2ML IJ SOLN
4.0000 mg | Freq: Once | INTRAMUSCULAR | Status: AC
  Administered 2018-06-23: 4 mg via INTRAMUSCULAR

## 2018-06-23 MED ORDER — ONDANSETRON 4 MG PO TBDP: 4.0000 mg | ORAL_TABLET | Freq: Once | ORAL | Status: DC

## 2018-06-23 NOTE — Discharge Instructions (Signed)
Please continue to take your Prilosec daily Please begin taking Carafate before meals and bedtime You Tyianna use Zofran as needed, this dissolves underneath your tongue  Please go to emergency room if abdominal pain worsening, developing persistent nausea and vomiting that is not controlled by Zofran, feeling weak, lightheaded or dizzy.

## 2018-06-23 NOTE — ED Triage Notes (Signed)
Abdominal pain started at 10 am today.  Nauseated, only vomited x 1 .  Diarrhea x 3 episodes

## 2018-06-23 NOTE — ED Provider Notes (Signed)
[place]    CSN: [id number] Arrival date & time: 06/23/18  1649     History   Chief Complaint Chief Complaint  Patient presents with  . Abdominal Pain    HPI Geovanna H Goller is a 47 y.o. female history of GERD presenting today for evaluation of epigastric abdominal pain.  Abdominal pain began approximately 10 AM today.  Pain is described as a burning sensation.  She has had nausea, with 2 episodes of vomiting.  She has had 3 episodes of diarrhea.  Denies any blood in the vomit or stool.  States that she has recurrent episodes of this, but this 1 is worse than normal.  She has been taking omeprazole daily.  She had an upper endoscopy approximately 3 to 4 months ago, states that only findings were reflux and stomach inflammation.  She has not followed up with gastroenterology.  HPI  History reviewed. No pertinent past medical history.  There are no active problems to display for this patient.   History reviewed. No pertinent surgical history.  OB History    Gravida  10   Para  10   Term  10   Preterm  0   AB  0   Living  10     SAB  0   TAB  0   Ectopic  0   Multiple  0   Live Births  10            Home Medications    Prior to Admission medications   Medication Sig Start Date End Date Taking? Authorizing Provider  ranitidine (ZANTAC) 150 MG capsule Take 1 capsule (150 mg total) by mouth daily. 06/03/18  Yes Bast, Traci A, NP  Cyanocobalamin (VITAMIN B-12 PO) Take 200 mcg by mouth 3 (three) times a week.     [provider]  cycloSPORINE (RESTASIS MULTIDOSE) 0.05 % ophthalmic emulsion Place 1 drop into both eyes 2 (two) times daily.    [provider]  omeprazole (PRILOSEC) 20 MG capsule Take 2 capsules (40 mg total) by mouth daily. 06/23/18   Wieters, Hallie C, PA-C  ondansetron (ZOFRAN ODT) 4 MG disintegrating tablet Take 1 tablet (4 mg total) by mouth every 8 (eight) hours as needed for nausea or vomiting. 06/23/18   Wieters,  Hallie C, PA-C  sucralfate (CARAFATE) 1 g tablet Take 1 tablet (1 g total) by mouth 4 (four) times daily -  with meals and at bedtime for 10 days. 06/23/18 07/03/18  Wieters, Elesa Hacker, PA-C    Family History Family History  Problem Relation Age of Onset  . Hypertension Mother   . Hypertension Father     Social History Social History   Tobacco Use  . Smoking status: Never Smoker  . Smokeless tobacco: Never Used  Substance Use Topics  . Alcohol use: No  . Drug use: No     Allergies   Patient has no known allergies.   Review of Systems Review of Systems  Constitutional: Positive for appetite change. Negative for activity change, chills, fatigue and fever.  HENT: Negative for congestion, ear pain, rhinorrhea, sinus pressure, sore throat and trouble swallowing.   Eyes: Negative for discharge and redness.  Respiratory: Negative for cough, chest tightness and shortness of breath.   Cardiovascular: Negative for chest pain.  Gastrointestinal: Positive for abdominal pain, diarrhea, nausea and vomiting.  Musculoskeletal: Negative for myalgias.  Skin: Negative for rash.  Neurological: Negative for dizziness, light-headedness and headaches.  Physical Exam Triage Vital Signs ED Triage Vitals  Enc Vitals Group     BP 06/23/18 1738 136/87     Pulse Rate 06/23/18 1738 81     Resp 06/23/18 1738 16     Temp 06/23/18 1738 98 F (36.7 C)     Temp Source 06/23/18 1738 Oral     SpO2 06/23/18 1738 100 %     Weight --      Height --      Head Circumference --      Peak Flow --      Pain Score 06/23/18 1739 8     Pain Loc --      Pain Edu? --      Excl. in [place]? --    No data found.  Updated Vital Signs BP 136/87 (BP Location: Left Arm)   Pulse 81   Temp 98 F (36.7 C) (Oral)   Resp 16   LMP 06/16/2018   SpO2 100%   Visual Acuity Right Eye Distance:   Left Eye Distance:   Bilateral Distance:    Right Eye Near:   Left Eye Near:    Bilateral Near:     Physical Exam    Constitutional: She is oriented to person, place, and time. She appears well-developed and well-nourished. No distress.  Initially actively vomiting, appears more alert and comfortable at end of visit  HENT:  Head: Normocephalic and atraumatic.  Mouth/Throat: Oropharynx is clear and moist.  Eyes: Pupils are equal, round, and reactive to light. Conjunctivae and EOM are normal.  Neck: Neck supple.  Cardiovascular: Normal rate and regular rhythm.  No murmur heard. Pulmonary/Chest: Effort normal and breath sounds normal. No respiratory distress.  Abdominal: Soft. There is tenderness.  Tenderness diffusely throughout abdomen, increased tenderness localized to epigastrium, negative rebound, negative Murphy's.  Musculoskeletal: She exhibits no edema.  Neurological: She is alert and oriented to person, place, and time.  Skin: Skin is warm and dry.  Psychiatric: She has a normal mood and affect.  Nursing note and vitals reviewed.    UC Treatments / Results  Labs (all labs ordered are listed, but only abnormal results are displayed) Labs Reviewed  POCT URINALYSIS DIP (DEVICE) - Abnormal; Notable for the following components:      Result Value   Bilirubin Urine SMALL (*)    Ketones, ur 80 (*)    Hgb urine dipstick TRACE (*)    All other components within normal limits  POCT PREGNANCY, URINE    EKG None  Radiology No results found.  Procedures Procedures (including critical care time)  Medications Ordered in UC Medications  famotidine (PEPCID) tablet 10 mg (10 mg Oral Given 06/23/18 1833)  ondansetron (ZOFRAN) injection 4 mg (4 mg Intramuscular Given 06/23/18 1833)  gi cocktail (Maalox,Lidocaine,Donnatal) (30 mLs Oral Given 06/23/18 1900)    Initial Impression / Assessment and Plan / UC Course  I have reviewed the triage vital signs and the nursing notes.  Pertinent labs & imaging results that were available during my care of the patient were reviewed by me and considered in my  medical decision making (see chart for details).    EKG normal sinus, no acute signs of ischemia or infarction, EKG also reviewed by Dr. Meda Coffee. Vital signs stable, UA unremarkable, episode similar to previous episodes that have had improvement with treatment of nausea and abdominal pain with reflux medicines.  Provided IM injection of Zofran, followed by GI cocktail.  Patient had significant improvement in her  nausea and vomiting, patient tolerated GI cocktail and had some mild improvement in pain.  Will have patient continue omeprazole, will add in Carafate as a trial to see if this helps with her pain.  This seems to be a chronic issue, follow-up with gastroenterology for further evaluation and management.  Go to emergency room if developing worsening pain, fever, persistent nausea vomiting.Discussed strict return precautions. Patient verbalized understanding and is agreeable with plan.  Final Clinical Impressions(s) / UC Diagnoses   Final diagnoses:  Acute gastritis, presence of bleeding unspecified, unspecified gastritis type     Discharge Instructions     Please continue to take your Prilosec daily Please begin taking Carafate before meals and bedtime You Nelline use Zofran as needed, this dissolves underneath your tongue  Please go to emergency room if abdominal pain worsening, developing persistent nausea and vomiting that is not controlled by Zofran, feeling weak, lightheaded or dizzy.    ED Prescriptions    Medication Sig Dispense Auth. Provider   ondansetron (ZOFRAN ODT) 4 MG disintegrating tablet Take 1 tablet (4 mg total) by mouth every 8 (eight) hours as needed for nausea or vomiting. 20 tablet Wieters, Hallie C, PA-C   sucralfate (CARAFATE) 1 g tablet Take 1 tablet (1 g total) by mouth 4 (four) times daily -  with meals and at bedtime for 10 days. 40 tablet Wieters, Hallie C, PA-C   omeprazole (PRILOSEC) 20 MG capsule Take 2 capsules (40 mg total) by mouth daily. 60 capsule  Wieters, Hallie C, PA-C     Controlled Substance Prescriptions [place] Controlled Substance Registry consulted? Not Applicable   Janith Lima, [place] 06/23/18 2056

## 2018-08-14 DIAGNOSIS — H1013 Acute atopic conjunctivitis, bilateral: Secondary | ICD-10-CM | POA: Diagnosis not present

## 2018-09-14 DIAGNOSIS — H16223 Keratoconjunctivitis sicca, not specified as Sjogren's, bilateral: Secondary | ICD-10-CM | POA: Diagnosis not present

## 2019-01-12 DIAGNOSIS — B356 Tinea cruris: Secondary | ICD-10-CM | POA: Diagnosis not present

## 2019-06-25 DIAGNOSIS — Z20828 Contact with and (suspected) exposure to other viral communicable diseases: Secondary | ICD-10-CM | POA: Diagnosis not present

## 2019-06-25 DIAGNOSIS — Z6826 Body mass index (BMI) 26.0-26.9, adult: Secondary | ICD-10-CM | POA: Diagnosis not present

## 2019-08-06 ENCOUNTER — Emergency Department (HOSPITAL_COMMUNITY)
Admission: EM | Admit: 2019-08-06 | Discharge: 2019-08-06 | Disposition: A | Discharge status: Home / Self Care | Payer: Medicaid Other | Attending: Emergency Medicine | Admitting: Emergency Medicine

## 2019-08-06 DIAGNOSIS — Z79899 Other long term (current) drug therapy: Secondary | ICD-10-CM | POA: Insufficient documentation

## 2019-08-06 DIAGNOSIS — H5711 Ocular pain, right eye: Secondary | ICD-10-CM | POA: Insufficient documentation

## 2019-08-06 MED ORDER — FLUORESCEIN SODIUM 1 MG OP STRP
1.0000 | ORAL_STRIP | Freq: Once | OPHTHALMIC | Status: AC
  Administered 2019-08-06: 1 via OPHTHALMIC
  Filled 2019-08-06: qty 1

## 2019-08-06 MED ORDER — BACITRACIN-POLYMYXIN B 500-10000 UNIT/GM OP OINT: 1.0000 "application " | TOPICAL_OINTMENT | OPHTHALMIC | 0 refills | Status: DC

## 2019-08-06 MED ORDER — TETRACAINE HCL 0.5 % OP SOLN
1.0000 [drp] | Freq: Once | OPHTHALMIC | Status: AC
  Administered 2019-08-06: 14:00:00 1 [drp] via OPHTHALMIC
  Filled 2019-08-06: qty 4

## 2019-08-06 MED ORDER — ERYTHROMYCIN 5 MG/GM OP OINT
1.0000 "application " | TOPICAL_OINTMENT | Freq: Four times a day (QID) | OPHTHALMIC | Status: DC
  Filled 2019-08-06: qty 3.5

## 2019-08-06 MED ORDER — NEOMYCIN-POLYMYXIN-HC 3.5-10000-1 OP SUSP: 4.0000 [drp] | Freq: Four times a day (QID) | OPHTHALMIC | 0 refills | Status: AC

## 2019-08-06 NOTE — Discharge Instructions (Addendum)
I have prescribed drops that were given to you in the ED, please apply these 4 times a day for the next  I have also given a prescription for polymyxin drops, apply 4 drops to your right right 4 times a day.  Follow up with Dr. Coralyn Pear if symptoms don't improve, his contact information is attached to your chart.

## 2019-08-06 NOTE — ED Triage Notes (Signed)
Pt to ER for evaluation of right eye pain, sclera reddened, clear eye drainage, with right headache that started this morning. She is in NAD.

## 2019-08-06 NOTE — ED Provider Notes (Signed)
[facility] EMERGENCY DEPARTMENT Provider Note   CSN: [id number] Arrival date & time: 08/06/19  [id number]     History   Chief Complaint Chief Complaint  Patient presents with  . Eye Pain    HPI Linda Villanueva is a 48 y.o. female.     48 y.o female with no PMH presents to the ED with a chief complaint of right eye redness x 7 hours ago. Patient reports waking up this morning with pain along her right eye, reports she was unable to open her eye when she open her eye she describes a shooting pain to the right eye.  She reports this pain is worse with opening and closing of her eye along with the light, not alleviated by any factors.  She also endorses a right-sided headache more so in the temporal area.  She has not tried any medical therapy for relieving symptoms.  Of note, patient is supposed to wear glasses at baseline but has not picked up her prescription.  She denies any dizziness, blurry vision, changes in vision.  No fevers or sick contacts.  The history is provided by the patient.  Eye Pain        OB History    Gravida  10   Para  10   Term  10   Preterm  0   AB  0   Living  10     SAB  0   TAB  0   Ectopic  0   Multiple  0   Live Births  10            Home Medications    Prior to Admission medications   Medication Sig Start Date End Date Taking? Authorizing Provider  Cyanocobalamin (VITAMIN B-12 PO) Take 200 mcg by mouth 3 (three) times a week.     [provider]  cycloSPORINE (RESTASIS MULTIDOSE) 0.05 % ophthalmic emulsion Place 1 drop into both eyes 2 (two) times daily.    [provider]  neomycin-polymyxin-hydrocortisone (CORTISPORIN) 3.5-10000-1 ophthalmic suspension Place 4 drops into the right eye 4 (four) times daily for 5 days. 08/06/19 08/11/19  Janeece Fitting, PA-C  omeprazole (PRILOSEC) 20 MG capsule Take 2 capsules (40 mg total) by mouth daily. 06/23/18   Wieters, Hallie C, PA-C  ondansetron (ZOFRAN ODT)  4 MG disintegrating tablet Take 1 tablet (4 mg total) by mouth every 8 (eight) hours as needed for nausea or vomiting. 06/23/18   Wieters, Hallie C, PA-C  ranitidine (ZANTAC) 150 MG capsule Take 1 capsule (150 mg total) by mouth daily. 06/03/18   Loura Halt A, NP  sucralfate (CARAFATE) 1 g tablet Take 1 tablet (1 g total) by mouth 4 (four) times daily -  with meals and at bedtime for 10 days. 06/23/18 07/03/18  Wieters, Elesa Hacker, PA-C    Family History Family History  Problem Relation Age of Onset  . Hypertension Mother   . Hypertension Father     Social History Social History   Tobacco Use  . Smoking status: Never Smoker  . Smokeless tobacco: Never Used  Substance Use Topics  . Alcohol use: No  . Drug use: No     Allergies   Patient has no known allergies.   Review of Systems Review of Systems  Constitutional: Negative for fever.  Eyes: Positive for photophobia, pain and redness. Negative for discharge and visual disturbance.     Physical Exam Updated Vital Signs BP 119/64 (BP Location: Right Arm)  Pulse 70   Temp 98 F (36.7 C) (Oral)   Resp 18   SpO2 100%   Physical Exam Vitals signs and nursing note reviewed.  Constitutional:      General: She is not in acute distress.    Appearance: She is well-developed.  HENT:     Head: Normocephalic and atraumatic.     Mouth/Throat:     Pharynx: No oropharyngeal exudate.  Eyes:     General: No allergic shiner.       Right eye: No foreign body, discharge or hordeolum.     Intraocular pressure: Right eye pressure is 10 mmHg. Measurements were taken using a handheld tonometer.    Conjunctiva/sclera:     Right eye: Right conjunctiva is injected. No chemosis, exudate or hemorrhage.    Pupils: Pupils are equal, round, and reactive to light.     Comments: No fluorescein uptake, corneal abrasion noted.  Significant injection of the right eye.  Neck:     Musculoskeletal: Normal range of motion.  Cardiovascular:     Rate and  Rhythm: Regular rhythm.     Heart sounds: Normal heart sounds.  Pulmonary:     Effort: Pulmonary effort is normal. No respiratory distress.     Breath sounds: Normal breath sounds.  Abdominal:     General: Bowel sounds are normal. There is no distension.     Palpations: Abdomen is soft.     Tenderness: There is no abdominal tenderness.  Musculoskeletal:        General: No tenderness or deformity.     Right lower leg: No edema.     Left lower leg: No edema.  Skin:    General: Skin is warm and dry.  Neurological:     Mental Status: She is alert and oriented to person, place, and time.       Visual Acuity  Right Eye Distance: 20/50 Left Eye Distance: 20/32 Bilateral Distance: 20/25  Right Eye Near:   Left Eye Near:    Bilateral Near:     ED Treatments / Results  Labs (all labs ordered are listed, but only abnormal results are displayed) Labs Reviewed - No data to display  EKG None  Radiology No results found.  Procedures Procedures (including critical care time)  Medications Ordered in ED Medications  erythromycin ophthalmic ointment 1 application (has no administration in time range)  tetracaine (PONTOCAINE) 0.5 % ophthalmic solution 1 drop (1 drop Both Eyes Given 08/06/19 1425)  fluorescein ophthalmic strip 1 strip (1 strip Both Eyes Given 08/06/19 1425)     Initial Impression / Assessment and Plan / ED Course  I have reviewed the triage vital signs and the nursing notes.  Pertinent labs & imaging results that were available during my care of the patient were reviewed by me and considered in my medical decision making (see chart for details).       Patient with no pertinent past medical history presents to the ED with complaints of eye pain of sudden onset 7 hours ago.  Patient reports photophobia, states is very painful for her to open and close her eye, reports a right-sided headache.  No dizziness, blurry vision or vision changes.  No fluorescin uptake on  my exam, no ulceration or abrasion noted. Photophobia but not consensual. Bedside ultrasound able to visualize lens. Will place ophtho for further recs. Slit lamp broken therefore exam is limited. No signs of retinal detachment, no suspicion for orbital trauma. Or orbital cellulitis. 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  3:00 PM Spoke to Dr. Coralyn Pear, of ophthalmology who recommended prophylactic treatment with erythromycin ointment and  Polymyxin drops for treatment of likely uveitis. She will also be provided with his contact information for in office follow up.   Portions of this note were generated with [employer]. Dictation errors Fareedah occur despite best attempts at proofreading.  Final Clinical Impressions(s) / ED Diagnoses   Final diagnoses:  Acute right eye pain    ED Discharge Orders         Ordered    bacitracin-polymyxin b (POLYSPORIN) ophthalmic ointment  4 times weekly,   Status:  Discontinued     08/06/19 1652    neomycin-polymyxin-hydrocortisone (CORTISPORIN) 3.5-10000-1 ophthalmic suspension  4 times daily     08/06/19 1709           Janeece Fitting, PA-C 08/06/19 1713    Sherwood Gambler, MD 08/09/19 [phone]

## 2019-08-26 ENCOUNTER — Ambulatory Visit: Payer: Medicaid Other | Admitting: Internal Medicine

## 2019-09-17 ENCOUNTER — Ambulatory Visit (INDEPENDENT_AMBULATORY_CARE_PROVIDER_SITE_OTHER): Payer: BC Managed Care – PPO | Admitting: Internal Medicine

## 2019-09-17 ENCOUNTER — Encounter: Payer: Self-pay | Admitting: Internal Medicine

## 2019-09-17 ENCOUNTER — Other Ambulatory Visit: Payer: Self-pay | Admitting: Internal Medicine

## 2019-09-17 ENCOUNTER — Other Ambulatory Visit: Payer: Self-pay

## 2019-09-17 VITALS — BP 110/80 | HR 78 | Temp 98.2°F | Ht 66.0 in | Wt 168.2 lb

## 2019-09-17 DIAGNOSIS — R7989 Other specified abnormal findings of blood chemistry: Secondary | ICD-10-CM | POA: Insufficient documentation

## 2019-09-17 DIAGNOSIS — E559 Vitamin D deficiency, unspecified: Secondary | ICD-10-CM

## 2019-09-17 DIAGNOSIS — R5383 Other fatigue: Secondary | ICD-10-CM | POA: Diagnosis not present

## 2019-09-17 DIAGNOSIS — R200 Anesthesia of skin: Secondary | ICD-10-CM

## 2019-09-17 DIAGNOSIS — E538 Deficiency of other specified B group vitamins: Secondary | ICD-10-CM | POA: Diagnosis not present

## 2019-09-17 DIAGNOSIS — R222 Localized swelling, mass and lump, trunk: Secondary | ICD-10-CM

## 2019-09-17 LAB — CBC WITH DIFFERENTIAL/PLATELET
Basophils Absolute: 0.1 10*3/uL (ref 0.0–0.1)
Basophils Relative: 1.1 % (ref 0.0–3.0)
Eosinophils Absolute: 0.2 10*3/uL (ref 0.0–0.7)
Eosinophils Relative: 3.4 % (ref 0.0–5.0)
HCT: 38.9 % (ref 36.0–46.0)
Hemoglobin: 12.9 g/dL (ref 12.0–15.0)
Lymphocytes Relative: 47.1 % — ABNORMAL HIGH (ref 12.0–46.0)
Lymphs Abs: 3 10*3/uL (ref 0.7–4.0)
MCHC: 33.2 g/dL (ref 30.0–36.0)
MCV: 84.6 fl (ref 78.0–100.0)
Monocytes Absolute: 0.4 10*3/uL (ref 0.1–1.0)
Monocytes Relative: 7 % (ref 3.0–12.0)
Neutro Abs: 2.6 10*3/uL (ref 1.4–7.7)
Neutrophils Relative %: 41.4 % — ABNORMAL LOW (ref 43.0–77.0)
Platelets: 311 10*3/uL (ref 150.0–400.0)
RBC: 4.6 Mil/uL (ref 3.87–5.11)
RDW: 14.8 % (ref 11.5–15.5)
WBC: 6.4 10*3/uL (ref 4.0–10.5)

## 2019-09-17 LAB — COMPREHENSIVE METABOLIC PANEL
ALT: 12 U/L (ref 0–35)
AST: 14 U/L (ref 0–37)
Albumin: 4.1 g/dL (ref 3.5–5.2)
Alkaline Phosphatase: 89 U/L (ref 39–117)
BUN: 9 mg/dL (ref 6–23)
CO2: 27 mEq/L (ref 19–32)
Calcium: 9 mg/dL (ref 8.4–10.5)
Chloride: 105 mEq/L (ref 96–112)
Creatinine, Ser: 0.61 mg/dL (ref 0.40–1.20)
GFR: 105.21 mL/min (ref >60.00)
Glucose, Bld: 98 mg/dL (ref 70–99)
Potassium: 4.7 mEq/L (ref 3.5–5.1)
Sodium: 139 mEq/L (ref 135–145)
Total Bilirubin: 0.7 mg/dL (ref 0.2–1.2)
Total Protein: 6.9 g/dL (ref 6.0–8.3)

## 2019-09-17 LAB — VITAMIN D 25 HYDROXY (VIT D DEFICIENCY, FRACTURES): VITD: 18.1 ng/mL — ABNORMAL LOW (ref 30.00–100.00)

## 2019-09-17 LAB — TSH: TSH: 0.29 u[IU]/mL — ABNORMAL LOW (ref 0.35–4.50)

## 2019-09-17 LAB — VITAMIN B12: Vitamin B-12: 198 pg/mL — ABNORMAL LOW (ref 211–911)

## 2019-09-17 MED ORDER — VITAMIN D (ERGOCALCIFEROL) 1.25 MG (50000 UNIT) PO CAPS: 50000.0000 [IU] | ORAL_CAPSULE | ORAL | 0 refills | Status: AC

## 2019-09-17 NOTE — Patient Instructions (Signed)
-  Nice seeing you today!!  -Lab work today; will notify you once results are available.  -Schedule follow up for your physical. Please come in fasting that day.  -Referral to dermatology to look at the lump in your chest.

## 2019-09-17 NOTE — Progress Notes (Signed)
New Patient Office Visit     CC/Reason for Visit: Establish care, discuss some acute complaints Previous PCP: Unknown Last Visit: At least 3 years ago  HPI: Linda Villanueva is a 48 y.o. female who is coming in today for the above mentioned reasons.  She comes in with an interpreter today.  She does speak a little [place] but her main language is Arabic.  With help of a translator we have determined that she has been diagnosed with vitamin D and vitamin B12 deficiency in the past.  It has been over 3 years since she has had any supplementation.  She has been complaining of her muscles hurting, extreme fatigue, and numbness of her fingertips and toes and suspects that her vitamin levels are low again as these are the same symptoms she had before.  In addition she would like me to look at a lump that she has over her left upper chest wall.  She states she had a lump before that was [place] in her country [place] but it has come back.  Past Medical/Surgical History: Past Medical History:  Diagnosis Date  . Vitamin B12 deficiency   . Vitamin D deficiency     No past surgical history on file.  Social History:  reports that she has never smoked. She has never used smokeless tobacco. She reports that she does not drink alcohol or use drugs.  Allergies: No Known Allergies  Family History:  Family History  Problem Relation Age of Onset  . Hypertension Mother   . Hypertension Father      Current Outpatient Medications:  .  Cyanocobalamin (VITAMIN B-12 PO), Take 200 mcg by mouth 3 (three) times a week. , Disp: , Rfl:  .  cycloSPORINE (RESTASIS MULTIDOSE) 0.05 % ophthalmic emulsion, Place 1 drop into both eyes 2 (two) times daily., Disp: , Rfl:  .  omeprazole (PRILOSEC) 20 MG capsule, Take 2 capsules (40 mg total) by mouth daily., Disp: 60 capsule, Rfl: 1 .  ondansetron (ZOFRAN ODT) 4 MG disintegrating tablet, Take 1 tablet (4 mg total) by mouth every 8 (eight) hours as needed  for nausea or vomiting., Disp: 20 tablet, Rfl: 0 .  ranitidine (ZANTAC) 150 MG capsule, Take 1 capsule (150 mg total) by mouth daily., Disp: 30 capsule, Rfl: 0 .  sucralfate (CARAFATE) 1 g tablet, Take 1 tablet (1 g total) by mouth 4 (four) times daily -  with meals and at bedtime for 10 days., Disp: 40 tablet, Rfl: 0  Review of Systems:  Constitutional: Denies fever, chills, diaphoresis, appetite chang. HEENT: Denies photophobia, eye pain, redness, hearing loss, ear pain, congestion, sore throat, rhinorrhea, sneezing, mouth sores, trouble swallowing, neck pain, neck stiffness and tinnitus.   Respiratory: Denies SOB, DOE, cough, chest tightness,  and wheezing.   Cardiovascular: Denies chest pain, palpitations and leg swelling.  Gastrointestinal: Denies nausea, vomiting, abdominal pain, diarrhea, constipation, blood in stool and abdominal distention.  Genitourinary: Denies dysuria, urgency, frequency, hematuria, flank pain and difficulty urinating.  Endocrine: Denies: hot or cold intolerance, sweats, changes in hair or nails, polyuria, polydipsia. Musculoskeletal: Denies myalgias, back pain, joint swelling, arthralgias and gait problem.  Skin: Denies pallor, rash and wound.  Neurological: Denies dizziness, seizures, syncope, weakness, light-headedness  and headaches.  Hematological: Denies adenopathy. Easy bruising, personal or family bleeding history  Psychiatric/Behavioral: Denies suicidal ideation, mood changes, confusion, nervousness, sleep disturbance and agitation    Physical Exam: Vitals:   09/17/19 0843  BP: 110/80  Pulse: 78  Temp: 98.2 F (36.8 C)  TempSrc: Temporal  SpO2: 96%  Weight: 168 lb 3.2 oz (76.3 kg)  Height: 5\' 6"  (1.676 m)   Body mass index is 27.15 kg/m.  Constitutional: NAD, calm, comfortable Eyes: PERRL, lids and conjunctivae normal ENMT: Mucous membranes are moist. Posterior pharynx clear of any exudate or lesions. Normal dentition. Tympanic membrane is  pearly white, no erythema or bulging. Neck: normal, supple, no masses, no thyromegaly Respiratory: clear to auscultation bilaterally, no wheezing, no crackles. Normal respiratory effort. No accessory muscle use.  Cardiovascular: Regular rate and rhythm, no murmurs / rubs / gallops. No extremity edema. 2+ pedal pulses. No carotid bruits.  Abdomen: no tenderness, no masses palpated. No hepatosplenomegaly. Bowel sounds positive.  Musculoskeletal: no clubbing / cyanosis. No joint deformity upper and lower extremities. Good ROM, no contractures. Normal muscle tone.  Skin: no rashes, lesions, ulcers. No induration Neurologic: Grossly intact and nonfocal Psychiatric: Normal judgment and insight. Alert and oriented x 3. Normal mood.    Impression and Plan:  Fatigue, unspecified type Vitamin D deficiency Vitamin B12 deficiency  Numbness of fingers of both hands  Numbness of toes -With her history of vitamin D and vitamin B12 deficiency, her symptoms sound very suspicious for recurrent B12 deficiency. -Will also check TSH, comprehensive metabolic panel and CBC to rule out electrolyte disturbances or hypothyroidism.   Lump in chest  -Refer to dermatology.  She will schedule return visit for physical.      Patient Instructions  -Nice seeing you today!!  -Lab work today; will notify you once results are available.  -Schedule follow up for your physical. Please come in fasting that day.  -Referral to dermatology to look at the lump in your chest.     Lelon Frohlich, MD [facility]

## 2019-09-22 ENCOUNTER — Telehealth: Payer: Self-pay | Admitting: *Deleted

## 2019-09-22 NOTE — Telephone Encounter (Signed)
Copied from [place] [id number]. Topic: Referral - Status >> Sep 22, 2019  2:39 PM Virl Axe D wrote: Reason for CRM: Dermatologist Dr. Ledell Peoples office stated they cannot schedule pt because they do not accept Medicaid, even as a secondary insurance. Stated they are leaving the referral in Proficiency. Please advise.

## 2019-09-22 NOTE — Telephone Encounter (Signed)
Refer to different dermatology office?

## 2019-09-24 NOTE — Telephone Encounter (Signed)
Spoke with patient and she has [employer].  She spoke with billing at Dr Ledell Peoples office, but could not get through to schedule an appointment.    I attempted to call to schedule an appointment, but the office is closed.  Will try at another time.

## 2019-09-29 ENCOUNTER — Other Ambulatory Visit: Payer: Self-pay

## 2019-09-29 ENCOUNTER — Ambulatory Visit: Payer: Self-pay

## 2019-09-29 NOTE — Telephone Encounter (Signed)
Spoke with someone at Dr eBay office and patient will have to go to the [facility].  Patient will be advised when she comes in for her B12 injection 09/29/2019

## 2019-09-29 NOTE — Telephone Encounter (Signed)
Patient was given information about the [facility].

## 2019-09-30 DIAGNOSIS — Z124 Encounter for screening for malignant neoplasm of cervix: Secondary | ICD-10-CM | POA: Diagnosis not present

## 2019-09-30 DIAGNOSIS — B356 Tinea cruris: Secondary | ICD-10-CM | POA: Diagnosis not present

## 2019-09-30 DIAGNOSIS — Z6827 Body mass index (BMI) 27.0-27.9, adult: Secondary | ICD-10-CM | POA: Diagnosis not present

## 2019-09-30 DIAGNOSIS — Z1231 Encounter for screening mammogram for malignant neoplasm of breast: Secondary | ICD-10-CM | POA: Diagnosis not present

## 2019-09-30 DIAGNOSIS — Z01411 Encounter for gynecological examination (general) (routine) with abnormal findings: Secondary | ICD-10-CM | POA: Diagnosis not present

## 2019-10-04 ENCOUNTER — Other Ambulatory Visit (INDEPENDENT_AMBULATORY_CARE_PROVIDER_SITE_OTHER): Payer: BC Managed Care – PPO

## 2019-10-04 ENCOUNTER — Ambulatory Visit (INDEPENDENT_AMBULATORY_CARE_PROVIDER_SITE_OTHER): Payer: BC Managed Care – PPO | Admitting: Internal Medicine

## 2019-10-04 ENCOUNTER — Other Ambulatory Visit: Payer: Self-pay

## 2019-10-04 DIAGNOSIS — E538 Deficiency of other specified B group vitamins: Secondary | ICD-10-CM

## 2019-10-04 DIAGNOSIS — R7989 Other specified abnormal findings of blood chemistry: Secondary | ICD-10-CM

## 2019-10-04 LAB — TSH: TSH: 0.44 u[IU]/mL (ref 0.35–4.50)

## 2019-10-04 LAB — T4, FREE: Free T4: 0.87 ng/dL (ref 0.60–1.60)

## 2019-10-04 LAB — T3, FREE: T3, Free: 3.4 pg/mL (ref 2.3–4.2)

## 2019-10-04 MED ORDER — CYANOCOBALAMIN 1000 MCG/ML IJ SOLN
1000.0000 ug | Freq: Once | INTRAMUSCULAR | Status: AC
  Administered 2019-10-04: 1000 ug via INTRAMUSCULAR

## 2019-10-04 NOTE — Progress Notes (Signed)
Patient was given vitamin B12 injection in her left deltoid and tolerated injection well.

## 2019-10-18 ENCOUNTER — Ambulatory Visit (INDEPENDENT_AMBULATORY_CARE_PROVIDER_SITE_OTHER): Payer: BC Managed Care – PPO | Admitting: *Deleted

## 2019-10-18 ENCOUNTER — Other Ambulatory Visit: Payer: Self-pay

## 2019-10-18 DIAGNOSIS — E538 Deficiency of other specified B group vitamins: Secondary | ICD-10-CM

## 2019-10-18 MED ORDER — CYANOCOBALAMIN 1000 MCG/ML IJ SOLN
1000.0000 ug | Freq: Once | INTRAMUSCULAR | Status: AC
  Administered 2019-10-18: 1000 ug via INTRAMUSCULAR

## 2019-10-18 NOTE — Progress Notes (Signed)
Patient here for B-12 injection. Injection administered.

## 2019-10-25 ENCOUNTER — Ambulatory Visit (INDEPENDENT_AMBULATORY_CARE_PROVIDER_SITE_OTHER): Payer: BC Managed Care – PPO

## 2019-10-25 ENCOUNTER — Other Ambulatory Visit: Payer: Self-pay

## 2019-10-25 DIAGNOSIS — E538 Deficiency of other specified B group vitamins: Secondary | ICD-10-CM | POA: Diagnosis not present

## 2019-10-25 MED ORDER — CYANOCOBALAMIN 1000 MCG/ML IJ SOLN
1000.0000 ug | Freq: Once | INTRAMUSCULAR | Status: AC
  Administered 2019-10-25: 1000 ug via INTRAMUSCULAR

## 2019-10-25 NOTE — Progress Notes (Signed)
Per orders of Dr. Hernandez, injection of B12 given by Jla Reynolds. Patient tolerated injection well.  

## 2019-10-27 ENCOUNTER — Other Ambulatory Visit: Payer: Self-pay

## 2019-10-27 DIAGNOSIS — Z20822 Contact with and (suspected) exposure to covid-19: Secondary | ICD-10-CM

## 2019-10-30 LAB — NOVEL CORONAVIRUS, NAA: SARS-CoV-2, NAA: NOT DETECTED

## 2019-11-08 DIAGNOSIS — R928 Other abnormal and inconclusive findings on diagnostic imaging of breast: Secondary | ICD-10-CM | POA: Diagnosis not present

## 2019-11-10 DIAGNOSIS — N6011 Diffuse cystic mastopathy of right breast: Secondary | ICD-10-CM | POA: Diagnosis not present

## 2019-11-10 DIAGNOSIS — R928 Other abnormal and inconclusive findings on diagnostic imaging of breast: Secondary | ICD-10-CM | POA: Diagnosis not present

## 2019-11-10 DIAGNOSIS — N6012 Diffuse cystic mastopathy of left breast: Secondary | ICD-10-CM | POA: Diagnosis not present

## 2019-11-15 DIAGNOSIS — L720 Epidermal cyst: Secondary | ICD-10-CM | POA: Diagnosis not present

## 2019-11-23 ENCOUNTER — Other Ambulatory Visit: Payer: Self-pay

## 2019-11-23 ENCOUNTER — Ambulatory Visit: Payer: BC Managed Care – PPO

## 2019-11-23 ENCOUNTER — Ambulatory Visit (INDEPENDENT_AMBULATORY_CARE_PROVIDER_SITE_OTHER): Payer: BC Managed Care – PPO | Admitting: *Deleted

## 2019-11-23 DIAGNOSIS — E538 Deficiency of other specified B group vitamins: Secondary | ICD-10-CM

## 2019-11-23 MED ORDER — CYANOCOBALAMIN 1000 MCG/ML IJ SOLN
1000.0000 ug | Freq: Once | INTRAMUSCULAR | Status: AC
  Administered 2019-11-23: 1000 ug via INTRAMUSCULAR

## 2019-11-23 NOTE — Progress Notes (Signed)
Per orders of Dr. Alden Hipp, injection of B12 given by Westley Hummer. Patient tolerated injection well.

## 2019-12-24 ENCOUNTER — Encounter: Payer: BC Managed Care – PPO | Admitting: Internal Medicine

## 2019-12-24 ENCOUNTER — Other Ambulatory Visit: Payer: Self-pay

## 2019-12-27 ENCOUNTER — Ambulatory Visit (INDEPENDENT_AMBULATORY_CARE_PROVIDER_SITE_OTHER): Payer: BC Managed Care – PPO

## 2019-12-27 ENCOUNTER — Other Ambulatory Visit: Payer: Self-pay

## 2019-12-27 DIAGNOSIS — E538 Deficiency of other specified B group vitamins: Secondary | ICD-10-CM

## 2019-12-27 MED ORDER — CYANOCOBALAMIN 1000 MCG/ML IJ SOLN
1000.0000 ug | Freq: Once | INTRAMUSCULAR | Status: AC
  Administered 2019-12-27: 1000 ug via INTRAMUSCULAR

## 2019-12-27 NOTE — Progress Notes (Signed)
Per orders of Dr. Jerilee Hoh, injection of Cyancobalamin given by Wyvonne Lenz. Patient tolerated injection well.

## 2019-12-29 ENCOUNTER — Encounter: Payer: BC Managed Care – PPO | Admitting: Internal Medicine

## 2020-01-18 DIAGNOSIS — Z20828 Contact with and (suspected) exposure to other viral communicable diseases: Secondary | ICD-10-CM | POA: Diagnosis not present

## 2020-01-21 ENCOUNTER — Other Ambulatory Visit: Payer: Self-pay

## 2020-01-24 ENCOUNTER — Ambulatory Visit: Payer: BC Managed Care – PPO

## 2020-01-31 ENCOUNTER — Other Ambulatory Visit: Payer: Self-pay

## 2020-02-01 ENCOUNTER — Ambulatory Visit (INDEPENDENT_AMBULATORY_CARE_PROVIDER_SITE_OTHER): Payer: BC Managed Care – PPO | Admitting: *Deleted

## 2020-02-01 ENCOUNTER — Other Ambulatory Visit: Payer: Self-pay

## 2020-02-01 DIAGNOSIS — E538 Deficiency of other specified B group vitamins: Secondary | ICD-10-CM

## 2020-02-01 MED ORDER — CYANOCOBALAMIN 1000 MCG/ML IJ SOLN
1000.0000 ug | Freq: Once | INTRAMUSCULAR | Status: AC
  Administered 2020-02-01: 1000 ug via INTRAMUSCULAR

## 2020-02-01 NOTE — Progress Notes (Signed)
Per orders of Cory Nafziger, injection of B12 given by Analyssa Downs. Patient tolerated injection well. 

## 2020-03-03 ENCOUNTER — Other Ambulatory Visit: Payer: Self-pay

## 2020-03-06 ENCOUNTER — Other Ambulatory Visit: Payer: Self-pay

## 2020-03-06 ENCOUNTER — Ambulatory Visit (INDEPENDENT_AMBULATORY_CARE_PROVIDER_SITE_OTHER): Payer: BC Managed Care – PPO | Admitting: *Deleted

## 2020-03-06 DIAGNOSIS — E538 Deficiency of other specified B group vitamins: Secondary | ICD-10-CM

## 2020-03-06 MED ORDER — CYANOCOBALAMIN 1000 MCG/ML IJ SOLN
1000.0000 ug | Freq: Once | INTRAMUSCULAR | Status: AC
  Administered 2020-03-06: 15:00:00 1000 ug via INTRAMUSCULAR

## 2020-03-06 NOTE — Progress Notes (Signed)
Per orders of Dr. Burchette, injection of Cyanocobalamin 1000mcg given by Nikia Mangino A. Patient tolerated injection well.  

## 2020-03-14 DIAGNOSIS — R110 Nausea: Secondary | ICD-10-CM | POA: Diagnosis not present

## 2020-03-14 DIAGNOSIS — Z1152 Encounter for screening for COVID-19: Secondary | ICD-10-CM | POA: Diagnosis not present

## 2020-03-14 DIAGNOSIS — M791 Myalgia, unspecified site: Secondary | ICD-10-CM | POA: Diagnosis not present

## 2020-03-14 DIAGNOSIS — R5383 Other fatigue: Secondary | ICD-10-CM | POA: Diagnosis not present

## 2020-03-16 ENCOUNTER — Other Ambulatory Visit: Payer: Self-pay

## 2020-03-16 ENCOUNTER — Ambulatory Visit (INDEPENDENT_AMBULATORY_CARE_PROVIDER_SITE_OTHER): Payer: BC Managed Care – PPO | Admitting: Internal Medicine

## 2020-03-16 ENCOUNTER — Other Ambulatory Visit: Payer: Self-pay | Admitting: Internal Medicine

## 2020-03-16 ENCOUNTER — Encounter: Payer: Self-pay | Admitting: Internal Medicine

## 2020-03-16 VITALS — BP 110/70 | HR 73 | Temp 98.0°F | Ht 67.0 in | Wt 169.3 lb

## 2020-03-16 DIAGNOSIS — Z0001 Encounter for general adult medical examination with abnormal findings: Secondary | ICD-10-CM | POA: Diagnosis not present

## 2020-03-16 DIAGNOSIS — Z0000 Encounter for general adult medical examination without abnormal findings: Secondary | ICD-10-CM

## 2020-03-16 DIAGNOSIS — E538 Deficiency of other specified B group vitamins: Secondary | ICD-10-CM | POA: Diagnosis not present

## 2020-03-16 DIAGNOSIS — R7989 Other specified abnormal findings of blood chemistry: Secondary | ICD-10-CM

## 2020-03-16 DIAGNOSIS — E559 Vitamin D deficiency, unspecified: Secondary | ICD-10-CM

## 2020-03-16 DIAGNOSIS — H6122 Impacted cerumen, left ear: Secondary | ICD-10-CM

## 2020-03-16 DIAGNOSIS — Z1231 Encounter for screening mammogram for malignant neoplasm of breast: Secondary | ICD-10-CM | POA: Diagnosis not present

## 2020-03-16 DIAGNOSIS — Z1211 Encounter for screening for malignant neoplasm of colon: Secondary | ICD-10-CM

## 2020-03-16 LAB — CBC WITH DIFFERENTIAL/PLATELET
Basophils Absolute: 0.1 10*3/uL (ref 0.0–0.1)
Basophils Relative: 0.9 % (ref 0.0–3.0)
Eosinophils Absolute: 0.3 10*3/uL (ref 0.0–0.7)
Eosinophils Relative: 4.3 % (ref 0.0–5.0)
HCT: 38.5 % (ref 36.0–46.0)
Hemoglobin: 12.8 g/dL (ref 12.0–15.0)
Lymphocytes Relative: 48.6 % — ABNORMAL HIGH (ref 12.0–46.0)
Lymphs Abs: 3.1 10*3/uL (ref 0.7–4.0)
MCHC: 33.2 g/dL (ref 30.0–36.0)
MCV: 86.2 fl (ref 78.0–100.0)
Monocytes Absolute: 0.4 10*3/uL (ref 0.1–1.0)
Monocytes Relative: 6.2 % (ref 3.0–12.0)
Neutro Abs: 2.5 10*3/uL (ref 1.4–7.7)
Neutrophils Relative %: 40 % — ABNORMAL LOW (ref 43.0–77.0)
Platelets: 302 10*3/uL (ref 150.0–400.0)
RBC: 4.46 Mil/uL (ref 3.87–5.11)
RDW: 13.9 % (ref 11.5–15.5)
WBC: 6.3 10*3/uL (ref 4.0–10.5)

## 2020-03-16 LAB — COMPREHENSIVE METABOLIC PANEL
ALT: 11 U/L (ref 0–35)
AST: 13 U/L (ref 0–37)
Albumin: 4.1 g/dL (ref 3.5–5.2)
Alkaline Phosphatase: 80 U/L (ref 39–117)
BUN: 9 mg/dL (ref 6–23)
CO2: 30 mEq/L (ref 19–32)
Calcium: 8.8 mg/dL (ref 8.4–10.5)
Chloride: 103 mEq/L (ref 96–112)
Creatinine, Ser: 0.56 mg/dL (ref 0.40–1.20)
GFR: 115.88 mL/min (ref >60.00)
Glucose, Bld: 99 mg/dL (ref 70–99)
Potassium: 4.5 mEq/L (ref 3.5–5.1)
Sodium: 139 mEq/L (ref 135–145)
Total Bilirubin: 0.4 mg/dL (ref 0.2–1.2)
Total Protein: 6.8 g/dL (ref 6.0–8.3)

## 2020-03-16 LAB — LIPID PANEL
Cholesterol: 220 mg/dL — ABNORMAL HIGH (ref 0–200)
HDL: 57.2 mg/dL (ref >39.00)
LDL Cholesterol: 149 mg/dL — ABNORMAL HIGH (ref 0–99)
NonHDL: 162.76
Total CHOL/HDL Ratio: 4
Triglycerides: 69 mg/dL (ref 0.0–149.0)
VLDL: 13.8 mg/dL (ref 0.0–40.0)

## 2020-03-16 LAB — TSH: TSH: 0.18 u[IU]/mL — ABNORMAL LOW (ref 0.35–4.50)

## 2020-03-16 LAB — HEMOGLOBIN A1C: Hgb A1c MFr Bld: 5.4 % (ref 4.6–6.5)

## 2020-03-16 LAB — VITAMIN B12: Vitamin B-12: 366 pg/mL (ref 211–911)

## 2020-03-16 LAB — VITAMIN D 25 HYDROXY (VIT D DEFICIENCY, FRACTURES): VITD: 26.2 ng/mL — ABNORMAL LOW (ref 30.00–100.00)

## 2020-03-16 MED ORDER — VITAMIN D (ERGOCALCIFEROL) 1.25 MG (50000 UNIT) PO CAPS: 50000.0000 [IU] | ORAL_CAPSULE | ORAL | 0 refills | Status: AC

## 2020-03-16 NOTE — Progress Notes (Signed)
Established Patient Office Visit     This visit occurred during the SARS-CoV-2 public health emergency.  Safety protocols were in place, including screening questions prior to the visit, additional usage of staff PPE, and extensive cleaning of exam room while observing appropriate contact time as indicated for disinfecting solutions.    CC/Reason for Visit: Annual preventive exam  HPI: Linda Villanueva is a 49 y.o. female who is coming in today for the above mentioned reasons. Past Medical History is significant for: Vitamin D and vitamin B12 deficiency.  She has been feeling well.  She has questions about her Covid vaccine.  She has routine eye and dental care.  She saw her gynecologist in December who did her Pap smear and mammogram.  She is overdue for colon cancer screening.  She has been having difficulty hearing out of her left ear.  She feels like her mood is a little depressed and is interested in counseling sessions, does not want to be started on medications as of yet.   Past Medical/Surgical History: Past Medical History:  Diagnosis Date  . Vitamin B12 deficiency   . Vitamin D deficiency     No past surgical history on file.  Social History:  reports that she has never smoked. She has never used smokeless tobacco. She reports that she does not drink alcohol or use drugs.  Allergies: No Known Allergies  Family History:  Family History  Problem Relation Age of Onset  . Hypertension Mother   . Hypertension Father      Current Outpatient Medications:  .  Cyanocobalamin (VITAMIN B-12 PO), Take 200 mcg by mouth 3 (three) times a week. , Disp: , Rfl:  .  cycloSPORINE (RESTASIS MULTIDOSE) 0.05 % ophthalmic emulsion, Place 1 drop into both eyes 2 (two) times daily., Disp: , Rfl:  .  omeprazole (PRILOSEC) 20 MG capsule, Take 2 capsules (40 mg total) by mouth daily., Disp: 60 capsule, Rfl: 1 .  ondansetron (ZOFRAN ODT) 4 MG disintegrating tablet, Take 1 tablet (4 mg  total) by mouth every 8 (eight) hours as needed for nausea or vomiting., Disp: 20 tablet, Rfl: 0 .  ranitidine (ZANTAC) 150 MG capsule, Take 1 capsule (150 mg total) by mouth daily., Disp: 30 capsule, Rfl: 0 .  sucralfate (CARAFATE) 1 g tablet, Take 1 tablet (1 g total) by mouth 4 (four) times daily -  with meals and at bedtime for 10 days., Disp: 40 tablet, Rfl: 0  Review of Systems:  Constitutional: Denies fever, chills, diaphoresis, appetite change and fatigue.  HEENT: Denies photophobia, eye pain, redness, hearing loss, ear pain, congestion, sore throat, rhinorrhea, sneezing, mouth sores, trouble swallowing, neck pain, neck stiffness and tinnitus.   Respiratory: Denies SOB, DOE, cough, chest tightness,  and wheezing.   Cardiovascular: Denies chest pain, palpitations and leg swelling.  Gastrointestinal: Denies nausea, vomiting, abdominal pain, diarrhea, constipation, blood in stool and abdominal distention.  Genitourinary: Denies dysuria, urgency, frequency, hematuria, flank pain and difficulty urinating.  Endocrine: Denies: hot or cold intolerance, sweats, changes in hair or nails, polyuria, polydipsia. Musculoskeletal: Denies myalgias, back pain, joint swelling, arthralgias and gait problem.  Skin: Denies pallor, rash and wound.  Neurological: Denies dizziness, seizures, syncope, weakness, light-headedness, numbness and headaches.  Hematological: Denies adenopathy. Easy bruising, personal or family bleeding history  Psychiatric/Behavioral: Denies suicidal ideation, mood changes, confusion, nervousness, sleep disturbance and agitation    Physical Exam: Vitals:   03/16/20 1103  BP: 110/70  Pulse: 73  Temp:  98 F (36.7 C)  TempSrc: Temporal  SpO2: 98%  Weight: 169 lb 4.8 oz (76.8 kg)  Height: _0  (1.702 m)    Body mass index is 26.52 kg/m.   Constitutional: NAD, calm, comfortable Eyes: PERRL, lids and conjunctivae normal ENMT: Mucous membranes are moist. Posterior pharynx  clear of any exudate or lesions. Normal dentition. Tympanic membrane is pearly white, no erythema or bulging on the right, left is obstructed by cerumen Neck: normal, supple, no masses, no thyromegaly Respiratory: clear to auscultation bilaterally, no wheezing, no crackles. Normal respiratory effort. No accessory muscle use.  Cardiovascular: Regular rate and rhythm, no murmurs / rubs / gallops. No extremity edema. 2+ pedal pulses. No carotid bruits.  Abdomen: no tenderness, no masses palpated. No hepatosplenomegaly. Bowel sounds positive.  Musculoskeletal: no clubbing / cyanosis. No joint deformity upper and lower extremities. Good ROM, no contractures. Normal muscle tone.  Skin: no rashes, lesions, ulcers. No induration Neurologic: CN 2-12 grossly intact. Sensation intact, DTR normal. Strength 5/5 in all 4.  Psychiatric: Normal judgment and insight. Alert and oriented x 3. Normal mood.    Impression and Plan:  Encounter for preventive health examination -She has routine eye care. -She is due for her Covid vaccination, otherwise immunizations are up-to-date. -Screening labs today. -Healthy lifestyle discussed in detail. -Her Pap smear and mammogram is up-to-date. -She is overdue for screening colonoscopy, she agrees to GI referral.  Screening for malignant neoplasm of colon  - Plan: Ambulatory referral to Gastroenterology  Vitamin D deficiency  - Plan: VITAMIN D 25 Hydroxy (Vit-D Deficiency, Fractures)  Vitamin B12 deficiency  - Plan: Vitamin B12  Left ear impacted cerumen -Cerumen Desimpaction  After patient consent was obtained, warm water was applied and gentle ear lavage performed on left ear. There were no complications and following the desimpaction the tympanic membranes were visible. Tympanic membranes are intact following the procedure. Auditory canals are normal. The patient reported relief of symptoms after removal of cerumen.     Patient Instructions  -Nice seeing  you today!!  -Lab work today; will notify you once results are available.   Preventive Care 52-66 Years Old, Female Preventive care refers to visits with your health care provider and lifestyle choices that can promote health and wellness. This includes:  A yearly physical exam. This Lonni also be called an annual well check.  Regular dental visits and eye exams.  Immunizations.  Screening for certain conditions.  Healthy lifestyle choices, such as eating a healthy diet, getting regular exercise, not using drugs or products that contain nicotine and tobacco, and limiting alcohol use. What can I expect for my preventive care visit? Physical exam Your health care provider will check your:  Height and weight. This Joselynn be used to calculate body mass index (BMI), which tells if you are at a healthy weight.  Heart rate and blood pressure.  Skin for abnormal spots. Counseling Your health care provider Rolando ask you questions about your:  Alcohol, tobacco, and drug use.  Emotional well-being.  Home and relationship well-being.  Sexual activity.  Eating habits.  Work and work Statistician.  Method of birth control.  Menstrual cycle.  Pregnancy history. What immunizations do I need?  Influenza (flu) vaccine  This is recommended every year. Tetanus, diphtheria, and pertussis (Tdap) vaccine  You Azzie need a Td booster every 10 years. Varicella (chickenpox) vaccine  You Demya need this if you have not been vaccinated. Zoster (shingles) vaccine  You Karem need this after age  75. Measles, mumps, and rubella (MMR) vaccine  You Rene need at least one dose of MMR if you were born in 1957 or later. You Judaea also need a second dose. Pneumococcal conjugate (PCV13) vaccine  You Annitta need this if you have certain conditions and were not previously vaccinated. Pneumococcal polysaccharide (PPSV23) vaccine  You Emmalena need one or two doses if you smoke cigarettes or if you have certain  conditions. Meningococcal conjugate (MenACWY) vaccine  You Shadara need this if you have certain conditions. Hepatitis A vaccine  You Shanyla need this if you have certain conditions or if you travel or work in places where you Keirah be exposed to hepatitis A. Hepatitis B vaccine  You Gennett need this if you have certain conditions or if you travel or work in places where you Alayha be exposed to hepatitis B. Haemophilus influenzae type b (Hib) vaccine  You Laurian need this if you have certain conditions. Human papillomavirus (HPV) vaccine  If recommended by your health care provider, you Candee need three doses over 6 months. You Agnieszka receive vaccines as individual doses or as more than one vaccine together in one shot (combination vaccines). Talk with your health care provider about the risks and benefits of combination vaccines. What tests do I need? Blood tests  Lipid and cholesterol levels. These Jajaira be checked every 5 years, or more frequently if you are over 18 years old.  Hepatitis C test.  Hepatitis B test. Screening  Lung cancer screening. You Michele have this screening every year starting at age 12 if you have a 30-pack-year history of smoking and currently smoke or have quit within the past 15 years.  Colorectal cancer screening. All adults should have this screening starting at age 82 and continuing until age 42. Your health care provider Vilma recommend screening at age 22 if you are at increased risk. You will have tests every 1-10 years, depending on your results and the type of screening test.  Diabetes screening. This is done by checking your blood sugar (glucose) after you have not eaten for a while (fasting). You Nitza have this done every 1-3 years.  Mammogram. This Janaisa be done every 1-2 years. Talk with your health care provider about when you should start having regular mammograms. This Bernell depend on whether you have a family history of breast cancer.  BRCA-related cancer screening. This  Marna be done if you have a family history of breast, ovarian, tubal, or peritoneal cancers.  Pelvic exam and Pap test. This Iyesha be done every 3 years starting at age 75. Starting at age 70, this Landry be done every 5 years if you have a Pap test in combination with an HPV test. Other tests  Sexually transmitted disease (STD) testing.  Bone density scan. This is done to screen for osteoporosis. You Nikhita have this scan if you are at high risk for osteoporosis. Follow these instructions at home: Eating and drinking  Eat a diet that includes fresh fruits and vegetables, whole grains, lean protein, and low-fat dairy.  Take vitamin and mineral supplements as recommended by your health care provider.  Do not drink alcohol if: ? Your health care provider tells you not to drink. ? You are pregnant, Turner be pregnant, or are planning to become pregnant.  If you drink alcohol: ? Limit how much you have to 0-1 drink a day. ? Be aware of how much alcohol is in your drink. In the [place] one drink equals one 12  oz bottle of beer (355 mL), one 5 oz glass of wine (148 mL), or one 1 oz glass of hard liquor (44 mL). Lifestyle  Take daily care of your teeth and gums.  Stay active. Exercise for at least 30 minutes on 5 or more days each week.  Do not use any products that contain nicotine or tobacco, such as cigarettes, e-cigarettes, and chewing tobacco. If you need help quitting, ask your health care provider.  If you are sexually active, practice safe sex. Use a condom or other form of birth control (contraception) in order to prevent pregnancy and STIs (sexually transmitted infections).  If told by your health care provider, take low-dose aspirin daily starting at age 22. What's next?  Visit your health care provider once a year for a well check visit.  Ask your health care provider how often you should have your eyes and teeth checked.  Stay up to date on all vaccines. This information is not  intended to replace advice given to you by your health care provider. Make sure you discuss any questions you have with your health care provider. Document Revised: 07/16/2018 Document Reviewed: 07/16/2018 Elsevier Patient Education  2020 [place], [place] [facility]

## 2020-03-16 NOTE — Patient Instructions (Signed)
-Nice seeing you today!!  -Lab work today; will notify you once results are available.   Preventive Care 47-47 Years Old, Female Preventive care refers to visits with your health care provider and lifestyle choices that can promote health and wellness. This includes:  A yearly physical exam. This Linda Villanueva also be called an annual well check.  Regular dental visits and eye exams.  Immunizations.  Screening for certain conditions.  Healthy lifestyle choices, such as eating a healthy diet, getting regular exercise, not using drugs or products that contain nicotine and tobacco, and limiting alcohol use. What can I expect for my preventive care visit? Physical exam Your health care provider will check your:  Height and weight. This Linda Villanueva be used to calculate body mass index (BMI), which tells if you are at a healthy weight.  Heart rate and blood pressure.  Skin for abnormal spots. Counseling Your health care provider Linda Villanueva ask you questions about your:  Alcohol, tobacco, and drug use.  Emotional well-being.  Home and relationship well-being.  Sexual activity.  Eating habits.  Work and work Statistician.  Method of birth control.  Menstrual cycle.  Pregnancy history. What immunizations do I need?  Influenza (flu) vaccine  This is recommended every year. Tetanus, diphtheria, and pertussis (Tdap) vaccine  You Linda Villanueva need a Td booster every 10 years. Varicella (chickenpox) vaccine  You Linda Villanueva need this if you have not been vaccinated. Zoster (shingles) vaccine  You Linda Villanueva need this after age 47. Measles, mumps, and rubella (MMR) vaccine  You Linda Villanueva need at least one dose of MMR if you were born in 1957 or later. You Linda Villanueva also need a second dose. Pneumococcal conjugate (PCV13) vaccine  You Kairi need this if you have certain conditions and were not previously vaccinated. Pneumococcal polysaccharide (PPSV23) vaccine  You Chanee need one or two doses if you smoke cigarettes or if you have  certain conditions. Meningococcal conjugate (MenACWY) vaccine  You Analee need this if you have certain conditions. Hepatitis A vaccine  You Keyonta need this if you have certain conditions or if you travel or work in places where you Evola be exposed to hepatitis A. Hepatitis B vaccine  You English need this if you have certain conditions or if you travel or work in places where you Meiko be exposed to hepatitis B. Haemophilus influenzae type b (Hib) vaccine  You Abbygail need this if you have certain conditions. Human papillomavirus (HPV) vaccine  If recommended by your health care provider, you Linda Villanueva need three doses over 6 months. You Linda Villanueva receive vaccines as individual doses or as more than one vaccine together in one shot (combination vaccines). Talk with your health care provider about the risks and benefits of combination vaccines. What tests do I need? Blood tests  Lipid and cholesterol levels. These Jezreel be checked every 5 years, or more frequently if you are over 12 years old.  Hepatitis C test.  Hepatitis B test. Screening  Lung cancer screening. You Cymone have this screening every year starting at age 47 if you have a 30-pack-year history of smoking and currently smoke or have quit within the past 15 years.  Colorectal cancer screening. All adults should have this screening starting at age 47 and continuing until age 19. Your health care provider Kei recommend screening at age 47 if you are at increased risk. You will have tests every 1-10 years, depending on your results and the type of screening test.  Diabetes screening. This is done by checking  your blood sugar (glucose) after you have not eaten for a while (fasting). You Siearra have this done every 1-3 years.  Mammogram. This Keisi be done every 1-2 years. Talk with your health care provider about when you should start having regular mammograms. This Linda Villanueva depend on whether you have a family history of breast cancer.  BRCA-related cancer  screening. This Endia be done if you have a family history of breast, ovarian, tubal, or peritoneal cancers.  Pelvic exam and Pap test. This Aniella be done every 3 years starting at age 47. Starting at age 35, this Christl be done every 5 years if you have a Pap test in combination with an HPV test. Other tests  Sexually transmitted disease (STD) testing.  Bone density scan. This is done to screen for osteoporosis. You Ketsia have this scan if you are at high risk for osteoporosis. Follow these instructions at home: Eating and drinking  Eat a diet that includes fresh fruits and vegetables, whole grains, lean protein, and low-fat dairy.  Take vitamin and mineral supplements as recommended by your health care provider.  Do not drink alcohol if: ? Your health care provider tells you not to drink. ? You are pregnant, Linda Villanueva be pregnant, or are planning to become pregnant.  If you drink alcohol: ? Limit how much you have to 0-1 drink a day. ? Be aware of how much alcohol is in your drink. In the [place] one drink equals one 12 oz bottle of beer (355 mL), one 5 oz glass of wine (148 mL), or one 1 oz glass of hard liquor (44 mL). Lifestyle  Take daily care of your teeth and gums.  Stay active. Exercise for at least 30 minutes on 5 or more days each week.  Do not use any products that contain nicotine or tobacco, such as cigarettes, e-cigarettes, and chewing tobacco. If you need help quitting, ask your health care provider.  If you are sexually active, practice safe sex. Use a condom or other form of birth control (contraception) in order to prevent pregnancy and STIs (sexually transmitted infections).  If told by your health care provider, take low-dose aspirin daily starting at age 71. What's next?  Visit your health care provider once a year for a well check visit.  Ask your health care provider how often you should have your eyes and teeth checked.  Stay up to date on all vaccines. This  information is not intended to replace advice given to you by your health care provider. Make sure you discuss any questions you have with your health care provider. Document Revised: 07/16/2018 Document Reviewed: 07/16/2018 Elsevier Patient Education  2020 [employer].

## 2020-03-22 ENCOUNTER — Other Ambulatory Visit: Payer: Self-pay | Admitting: Internal Medicine

## 2020-03-22 DIAGNOSIS — E559 Vitamin D deficiency, unspecified: Secondary | ICD-10-CM

## 2020-03-22 DIAGNOSIS — R7989 Other specified abnormal findings of blood chemistry: Secondary | ICD-10-CM

## 2020-03-26 DIAGNOSIS — Z20828 Contact with and (suspected) exposure to other viral communicable diseases: Secondary | ICD-10-CM | POA: Diagnosis not present

## 2020-03-26 DIAGNOSIS — Z20822 Contact with and (suspected) exposure to covid-19: Secondary | ICD-10-CM | POA: Diagnosis not present

## 2020-04-05 ENCOUNTER — Ambulatory Visit: Payer: BC Managed Care – PPO

## 2020-04-18 ENCOUNTER — Ambulatory Visit (INDEPENDENT_AMBULATORY_CARE_PROVIDER_SITE_OTHER): Payer: BC Managed Care – PPO | Admitting: Endocrinology

## 2020-04-18 ENCOUNTER — Encounter: Payer: Self-pay | Admitting: Endocrinology

## 2020-04-18 ENCOUNTER — Other Ambulatory Visit: Payer: Self-pay

## 2020-04-18 VITALS — BP 104/72 | HR 75 | Ht 67.0 in | Wt 169.0 lb

## 2020-04-18 DIAGNOSIS — R7989 Other specified abnormal findings of blood chemistry: Secondary | ICD-10-CM | POA: Diagnosis not present

## 2020-04-18 NOTE — Progress Notes (Signed)
Subjective:    Patient ID: Linda Villanueva, female    DOB: January 13, 1971, 49 y.o.   MRN: [id number]  HPI Pt is referred by Dr Doreatha Lew, for hyperthyroidism.  Pt reports he was dx'ed with hyperthyroidism in 2020.  she has never been on therapy for this.  she has never had XRT to the anterior neck, or thyroid surgery.  she has never had thyroid imaging.  she does not consume kelp or any other non-prescribed thyroid medication.  she has never been on amiodarone.  pt states she feels well in general, except for dry eyes.   Past Medical History:  Diagnosis Date  . Vitamin B12 deficiency   . Vitamin D deficiency     No past surgical history on file.  Social History   Socioeconomic History  . Marital status: Married    Spouse name: Not on file  . Number of children: Not on file  . Years of education: Not on file  . Highest education level: Not on file  Occupational History  . Not on file  Tobacco Use  . Smoking status: Never Smoker  . Smokeless tobacco: Never Used  Substance and Sexual Activity  . Alcohol use: No  . Drug use: No  . Sexual activity: Yes    Partners: Male    Birth control/protection: I.U.D.  Other Topics Concern  . Not on file  Social History Narrative  . Not on file   Social Determinants of Health   Financial Resource Strain:   . Difficulty of Paying Living Expenses:   Food Insecurity:   . Worried About Charity fundraiser in the Last Year:   . Arboriculturist in the Last Year:   Transportation Needs:   . Film/video editor (Medical):   Marland Kitchen Lack of Transportation (Non-Medical):   Physical Activity:   . Days of Exercise per Week:   . Minutes of Exercise per Session:   Stress:   . Feeling of Stress :   Social Connections:   . Frequency of Communication with Friends and Family:   . Frequency of Social Gatherings with Friends and Family:   . Attends Religious Services:   . Active Member of Clubs or Organizations:   . Attends Archivist Meetings:     Marland Kitchen Marital Status:   Intimate Partner Violence:   . Fear of Current or Ex-Partner:   . Emotionally Abused:   Marland Kitchen Physically Abused:   . Sexually Abused:     Current Outpatient Medications on File Prior to Visit  Medication Sig Dispense Refill  . Cyanocobalamin 1000 MCG/ML KIT Inject 1,000 mcg as directed every 30 (thirty) days.    . cycloSPORINE (RESTASIS MULTIDOSE) 0.05 % ophthalmic emulsion Place 1 drop into both eyes 2 (two) times daily.    . Vitamin D, Ergocalciferol, (DRISDOL) 1.25 MG (50000 UNIT) CAPS capsule Take 1 capsule (50,000 Units total) by mouth every 7 (seven) days for 12 doses. 12 capsule 0   No current facility-administered medications on file prior to visit.    No Known Allergies  Family History  Problem Relation Age of Onset  . Hypertension Mother   . Hypertension Father   . Thyroid disease Neg Hx     BP 104/72   Pulse 75   Ht '5\' 7"'  (1.702 m)   Wt 169 lb (76.7 kg)   SpO2 99%   BMI 26.47 kg/m   Review of Systems denies weight loss, palpitations, sob, muscle weakness, excessive diaphoresis, tremor,  anxiety, and heat intolerance.       Objective:   Physical Exam VS: see vs page GEN: no distress HEAD: head: no deformity eyes: no periorbital swelling, no proptosis external nose and ears are normal NECK: supple, thyroid is not enlarged CHEST WALL: no deformity LUNGS: clear to auscultation CV: reg rate and rhythm, no murmur.  MUSCULOSKELETAL: muscle bulk and strength are grossly normal.  no obvious joint swelling.  gait is normal and steady EXTEMITIES: no deformity.  no leg edema PULSES: DP intact bilaterally.   NEURO:  cn 2-12 grossly intact.   readily moves all 4's.  sensation is intact to touch on all 4's SKIN:  Normal texture and temperature.  No rash or suspicious lesion is visible.   NODES:  None palpable at the neck PSYCH: alert, well-oriented.  Does not appear anxious nor depressed.   Lab Results  Component Value Date   TSH 0.18 (L)  03/16/2020   I have reviewed outside records, and summarized: Pt was noted to have low TSH, and referred here.  Wellness and several vitamin deficincies we are also addressed.      Assessment & Plan:  Hyperthyroidism, new to me.  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  This is usually due to (hereditary) tiny nodules in the thyroid. It is fine with me to hold off on medication for this, and please come back for a follow-up appointment in 6 months.   I hope you continue to feel well.           .        ()   .                 6 .      Marland Kitchen

## 2020-04-18 NOTE — Patient Instructions (Signed)
You have a mildly overactive thyroid.  This is usually due to (hereditary) tiny nodules in the thyroid. It is fine with me to hold off on medication for this, and please come back for a follow-up appointment in 6 months.   I hope you continue to feel well.           .        ()   .                 6 .      Marland Kitchen

## 2020-05-11 ENCOUNTER — Other Ambulatory Visit: Payer: Self-pay

## 2020-05-12 ENCOUNTER — Encounter: Payer: Self-pay | Admitting: Family Medicine

## 2020-05-12 ENCOUNTER — Ambulatory Visit (INDEPENDENT_AMBULATORY_CARE_PROVIDER_SITE_OTHER): Payer: BC Managed Care – PPO | Admitting: Family Medicine

## 2020-05-12 VITALS — BP 110/60 | HR 74 | Temp 97.5°F | Ht 67.0 in | Wt 170.0 lb

## 2020-05-12 DIAGNOSIS — L720 Epidermal cyst: Secondary | ICD-10-CM | POA: Diagnosis not present

## 2020-05-12 MED ORDER — SULFAMETHOXAZOLE-TRIMETHOPRIM 800-160 MG PO TABS: 1.0000 | ORAL_TABLET | Freq: Two times a day (BID) | ORAL | 0 refills | Status: AC

## 2020-05-12 NOTE — Patient Instructions (Signed)
Epidermal Cyst Removal, Care After This sheet gives you information about how to care for yourself after your procedure. Your health care provider Lilly also give you more specific instructions. If you have problems or questions, contact your health care provider. What can I expect after the procedure? After the procedure, it is common to have:  Soreness in the area where your cyst was removed.  Tightness or itchiness from the stitches (sutures) in your skin. Follow these instructions at home: Medicines  Take over-the-counter and prescription medicines only as told by your health care provider.  If you were prescribed an antibiotic medicine or ointment, take or apply it as told by your health care provider. Do not stop using the antibiotic even if you start to feel better. Incision care   Follow instructions from your health care provider about how to take care of your incision. Make sure you: ? Wash your hands with soap and water before you change your bandage (dressing). If soap and water are not available, use hand sanitizer. ? Change your dressing as told by your health care provider. ? Leave sutures, skin glue, or adhesive strips in place. These skin closures Judit need to stay in place for 1-2 weeks or longer. If adhesive strip edges start to loosen and curl up, you Andersen trim the loose edges. Do not remove adhesive strips completely unless your health care provider tells you to do that.  Keep the dressingdry until your health care provider says that it can be removed.  After your dressing is off, check your incision area every day for signs of infection. Check for: ? Redness, swelling, or pain. ? Fluid or blood. ? Warmth. ? Pus or a bad smell. General instructions  Do not take baths, swim, or use a hot tub until your health care provider approves. Ask your health care provider if you Marit take showers. You Amorah only be allowed to take sponge baths.  Your health care provider Khrystina ask  you to avoid contact sports or activities that take a lot of effort. Do not do anything that stretches or puts pressure on your incision.  You can return to your normal diet.  Keep all follow-up visits as told by your health care provider. This is important. Contact a health care provider if:  You have a fever.  You have redness, swelling, or pain in the incision area.  You have fluid or blood coming from your incision.  You have pus or a bad smell coming from your incision.  Your incision feels warm to the touch.  Your cyst grows back. Summary  After the procedure, it is common to have soreness in the area where your cyst was removed.  Take or apply over-the-counter and prescription medicines only as told by your health care provider.  Follow instructions from your health care provider about how to take care of your incision. This information is not intended to replace advice given to you by your health care provider. Make sure you discuss any questions you have with your health care provider. Document Revised: 02/24/2018 Document Reviewed: 08/28/2017 Elsevier Patient Education  [place].  Epidermal Cyst  An epidermal cyst is a sac made of skin tissue. The sac contains a substance called keratin. Keratin is a protein that is normally secreted through the hair follicles. When keratin becomes trapped in the top layer of skin (epidermis), it can form an epidermal cyst. Epidermal cysts can be found anywhere on your body. These cysts are  usually harmless (benign), and they Theresa not cause symptoms unless they become infected. What are the causes? This condition Silvana be caused by:  A blocked hair follicle.  A hair that curls and re-enters the skin instead of growing straight out of the skin (ingrown hair).  A blocked pore.  Irritated skin.  An injury to the skin.  Certain conditions that are passed along from parent to child (inherited).  Human papillomavirus  (HPV).  Long-term (chronic) sun damage to the skin. What increases the risk? The following factors Hiedi make you more likely to develop an epidermal cyst:  Having acne.  Being overweight.  Being 31-49 years old. What are the signs or symptoms? The only symptom of this condition Keari be a small, painless lump underneath the skin. When an epidermal cyst ruptures, it Omie become infected. Symptoms Bita include:  Redness.  Inflammation.  Tenderness.  Warmth.  Fever.  Keratin draining from the cyst. Keratin is grayish-white, bad-smelling substance.  Pus draining from the cyst. How is this diagnosed? This condition is diagnosed with a physical exam.  In some cases, you Azharia have a sample of tissue (biopsy) taken from your cyst to be examined under a microscope or tested for bacteria.  You Jaylon be referred to a health care provider who specializes in skin care (dermatologist). How is this treated? In many cases, epidermal cysts go away on their own without treatment. If a cyst becomes infected, treatment Katilin include:  Opening and draining the cyst, done by a health care provider. After draining, minor surgery to remove the rest of the cyst Shivaun be done.  Antibiotic medicine.  Injections of medicines (steroids) that help to reduce inflammation.  Surgery to remove the cyst. Surgery Dimples be done if the cyst: ? Becomes large. ? Bothers you. ? Has a chance of turning into cancer.  Do not try to open a cyst yourself. Follow these instructions at home:  Take over-the-counter and prescription medicines only as told by your health care provider.  If you were prescribed an antibiotic medicine, take it it as told by your health care provider. Do not stop using the antibiotic even if you start to feel better.  Keep the area around your cyst clean and dry.  Wear loose, dry clothing.  Avoid touching your cyst.  Check your cyst every day for signs of infection. Check for: ? Redness,  swelling, or pain. ? Fluid or blood. ? Warmth. ? Pus or a bad smell.  Keep all follow-up visits as told by your health care provider. This is important. How is this prevented?  Wear clean, dry, clothing.  Avoid wearing tight clothing.  Keep your skin clean and dry. Take showers or baths every day. Contact a health care provider if:  Your cyst develops symptoms of infection.  Your condition is not improving or is getting worse.  You develop a cyst that looks different from other cysts you have had.  You have a fever. Get help right away if:  Redness spreads from the cyst into the surrounding area. Summary  An epidermal cyst is a sac made of skin tissue. These cysts are usually harmless (benign), and they Livier not cause symptoms unless they become infected.  If a cyst becomes infected, treatment Tien include surgery to open and drain the cyst, or to remove it. Treatment Bettyjo also include medicines by mouth or through an injection.  Take over-the-counter and prescription medicines only as told by your health care provider. If you  were prescribed an antibiotic medicine, take it as told by your health care provider. Do not stop using the antibiotic even if you start to feel better.  Contact a health care provider if your condition is not improving or is getting worse.  Keep all follow-up visits as told by your health care provider. This is important. This information is not intended to replace advice given to you by your health care provider. Make sure you discuss any questions you have with your health care provider. Document Revised: 02/25/2019 Document Reviewed: 05/18/2018 Elsevier Patient Education  [place].

## 2020-05-14 NOTE — Progress Notes (Signed)
Subjective:    Patient ID: Linda Villanueva, female    DOB: Elky 14, 1972, 49 y.o.   MRN: [id number]  Chief Complaint  Patient presents with  . Mass    sx 3 day itchy red "tight"  Arabic interpreter present.  HPI Patient was seen today for ongoing concern.  Pt with mass on L upper chest present x 6 yrs.  Area has progressively increased in size.  Over the last 3 days the area has become pruritic, erythematous, tight, and painful.  Pt thinks she Tiye have had chills over the last few days.  Denies fever, n/v, breast complaints, allergies to meds, h/o similar lesions.  Past Medical History:  Diagnosis Date  . Vitamin B12 deficiency   . Vitamin D deficiency     No Known Allergies  ROS General: Denies fever, chills, night sweats, changes in weight, changes in appetite HEENT: Denies headaches, ear pain, changes in vision, rhinorrhea, sore throat CV: Denies CP, palpitations, SOB, orthopnea Pulm: Denies SOB, cough, wheezing GI: Denies abdominal pain, nausea, vomiting, diarrhea, constipation GU: Denies dysuria, hematuria, frequency, vaginal discharge Msk: Denies muscle cramps, joint pains Neuro: Denies weakness, numbness, tingling Skin: Denies rashes, bruising  + mass in L upper chest Psych: Denies depression, anxiety, hallucinations      Objective:    Blood pressure 110/60, pulse 74, temperature (!) 97.5 F (36.4 C), temperature source Other (Comment), height 5\' 7"  (1.702 m), weight 170 lb (77.1 kg), SpO2 96 %.   Gen. Pleasant, well-nourished, in no distress, normal affect  HEENT: [place]/AT, face symmetric, no scleral icterus, PERRLA, EOMI, nares patent without drainage Lungs: no accessory muscle use Cardiovascular: RRR, no peripheral edema Neuro:  A&Ox3, CN II-XII intact, normal gait Skin:  Warm, no rash. Large jagged appearing lesion of L upper chest.  Mobile, fluctuant, and with TTP.  A dark appearing indentation-tract opening present near center of lesion with mild erythema  surrounding.  No drainage. I&D preformed.   Wt Readings from Last 3 Encounters:  05/12/20 170 lb (77.1 kg)  04/18/20 169 lb (76.7 kg)  03/16/20 169 lb 4.8 oz (76.8 kg)    Lab Results  Component Value Date   WBC 6.3 03/16/2020   HGB 12.8 03/16/2020   HCT 38.5 03/16/2020   PLT 302.0 03/16/2020   GLUCOSE 99 03/16/2020   CHOL 220 (H) 03/16/2020   TRIG 69.0 03/16/2020   HDL 57.20 03/16/2020   LDLCALC 149 (H) 03/16/2020   ALT 11 03/16/2020   AST 13 03/16/2020   NA 139 03/16/2020   K 4.5 03/16/2020   CL 103 03/16/2020   CREATININE 0.56 03/16/2020   BUN 9 03/16/2020   CO2 30 03/16/2020   TSH 0.18 (L) 03/16/2020   HGBA1C 5.4 03/16/2020    Incision and Drainage Procedure Note  Pre-operative Diagnosis: cyst present x  6 yrs  Post-operative Diagnosis: epidermoid cyst  Indications: pain  Anesthesia: 2% lidocaine with epi  Procedure Details  The procedure, risks and complications have been discussed in detail (including, but not limited to airway compromise, infection, bleeding) with the patient, and the patient has signed consent to the procedure.  The skin was sterilely prepped and draped over the affected area in the usual fashion. After adequate local anesthesia, I&D with a #11 blade was performed on the left, upper chest. Copious amounts of thick, dark, malodorous debris expressed from area.  Cyst sac firmly attached.  Cyst sack removed with blunt dissection and excision.  Area packed with sterile packing. The patient was  observed.  EBL: minimal  Condition: Tolerated procedure well and Stable   Complications: none.   Assessment/Plan:  Epidermoid cyst of skin of chest  -consent obtained. Cyst I&D'd.  Pt tolerated procedure well. -area packed. -given precuations -given handout and note for work. -RTC in 3 days for packing removal and likely repacking. - Plan: sulfamethoxazole-trimethoprim (BACTRIM DS) 800-160 MG tablet  F/u in 3 days  More than 50% of over  60 minutes spent in total in caring for this patient face-to-face, counseling and/or coordinating care.    Grier Mitts, MD

## 2020-05-15 ENCOUNTER — Other Ambulatory Visit: Payer: Self-pay

## 2020-05-15 ENCOUNTER — Encounter: Payer: Self-pay | Admitting: Family Medicine

## 2020-05-15 ENCOUNTER — Ambulatory Visit (INDEPENDENT_AMBULATORY_CARE_PROVIDER_SITE_OTHER): Payer: BC Managed Care – PPO | Admitting: Family Medicine

## 2020-05-15 VITALS — BP 98/76 | HR 92 | Temp 97.6°F

## 2020-05-15 DIAGNOSIS — L720 Epidermal cyst: Secondary | ICD-10-CM

## 2020-05-15 NOTE — Progress Notes (Signed)
Subjective:    Patient ID: Linda Villanueva, female    DOB: [birth year], 49 y.o.   MRN: [id number]  No chief complaint on file. Pt accompanied by Arabic interpreter.  HPI Patient was seen today for f/u and repacking of cyst.  S/p I& D of epidermoid cyst on L chest.  Packing in place.  Pt denies pain, erythema, fever, chills, n/v.  Notes mild bloody drainage initially.  Past Medical History:  Diagnosis Date  . Vitamin B12 deficiency   . Vitamin D deficiency     No Known Allergies  ROS General: Denies fever, chills, night sweats, changes in weight, changes in appetite HEENT: Denies headaches, ear pain, changes in vision, rhinorrhea, sore throat CV: Denies CP, palpitations, SOB, orthopnea Pulm: Denies SOB, cough, wheezing GI: Denies abdominal pain, nausea, vomiting, diarrhea, constipation GU: Denies dysuria, hematuria, frequency, vaginal discharge Msk: Denies muscle cramps, joint pains Neuro: Denies weakness, numbness, tingling Skin: Denies rashes, bruising  +cyst of L chest Psych: Denies depression, anxiety, hallucinations     Objective:    Blood pressure 98/76, pulse 92, temperature 97.6 F (36.4 C), temperature source Temporal, SpO2 98 %.   Gen. Pleasant, well-nourished, in no distress, normal affect   HEENT: [place]/AT, face symmetric, no scleral icterus, PERRLA, EOMI, nares patent without drainage Lungs: no accessory muscle use Cardiovascular: RRR, no peripheral edema Neuro:  A&Ox3, CN II-XII intact, normal gait Skin:  Warm, no lesions/ rash.  Incision s/p I&D open with packing in place.  Skin with faint erythema, no ecchymosis or induration noted. Packing removed and replaced with clean sterile packing.   Wt Readings from Last 3 Encounters:  05/12/20 170 lb (77.1 kg)  04/18/20 169 lb (76.7 kg)  03/16/20 169 lb 4.8 oz (76.8 kg)    Lab Results  Component Value Date   WBC 6.3 03/16/2020   HGB 12.8 03/16/2020   HCT 38.5 03/16/2020   PLT 302.0 03/16/2020   GLUCOSE 99  03/16/2020   CHOL 220 (H) 03/16/2020   TRIG 69.0 03/16/2020   HDL 57.20 03/16/2020   LDLCALC 149 (H) 03/16/2020   ALT 11 03/16/2020   AST 13 03/16/2020   NA 139 03/16/2020   K 4.5 03/16/2020   CL 103 03/16/2020   CREATININE 0.56 03/16/2020   BUN 9 03/16/2020   CO2 30 03/16/2020   TSH 0.18 (L) 03/16/2020   HGBA1C 5.4 03/16/2020    Assessment/Plan:  Epidermoid cyst of skin of chest  -healing -consent obtained.  Packing removed and replaced -continue abx -RTC in 3 days for repacking. -given precautions  F/u in 3 days for repacking  Grier Mitts, MD

## 2020-05-18 ENCOUNTER — Other Ambulatory Visit: Payer: Self-pay

## 2020-05-18 ENCOUNTER — Ambulatory Visit (INDEPENDENT_AMBULATORY_CARE_PROVIDER_SITE_OTHER): Payer: BC Managed Care – PPO | Admitting: Family Medicine

## 2020-05-18 ENCOUNTER — Encounter: Payer: Self-pay | Admitting: Family Medicine

## 2020-05-18 VITALS — BP 120/78 | HR 88 | Temp 97.6°F

## 2020-05-18 DIAGNOSIS — L720 Epidermal cyst: Secondary | ICD-10-CM

## 2020-05-18 NOTE — Progress Notes (Signed)
Subjective:    Patient ID: Linda Villanueva, female    DOB: February 09, 1971, 49 y.o.   MRN: [id number]  No chief complaint on file. Arabic interpreter present.  HPI Patient was seen today for repacking of L upper chest s/p I&D and excision of epidermoid cyst on 05/12/20.  Pt denies pain, erythema, induration, drainage.  Past Medical History:  Diagnosis Date  . Vitamin B12 deficiency   . Vitamin D deficiency     No Known Allergies  ROS General: Denies fever, chills, night sweats, changes in weight, changes in appetite HEENT: Denies headaches, ear pain, changes in vision, rhinorrhea, sore throat CV: Denies CP, palpitations, SOB, orthopnea Pulm: Denies SOB, cough, wheezing GI: Denies abdominal pain, nausea, vomiting, diarrhea, constipation GU: Denies dysuria, hematuria, frequency, vaginal discharge Msk: Denies muscle cramps, joint pains Neuro: Denies weakness, numbness, tingling Skin: Denies rashes, bruising  +cyst s/p I&D on L upper chest Psych: Denies depression, anxiety, hallucinations      Objective:    Blood pressure 120/78, pulse 88, temperature 97.6 F (36.4 C), temperature source Temporal, SpO2 99 %.  Gen. Pleasant, well-nourished, in no distress, normal affect   HEENT: [place]/AT, face symmetric, no scleral icterus, PERRLA, EOMI, nares patent without drainage Lungs: no accessory muscle use Cardiovascular: RRR, no m/r/g, no peripheral edema Neuro:  A&Ox3, CN II-XII intact, normal gait Skin:  Warm, dry, intact.  L upper chest with packing in place.  Packing removed, serosanguineous/slightly purulent drainage noted on packing.  Sterile packing replaced.  Wt Readings from Last 3 Encounters:  05/12/20 170 lb (77.1 kg)  04/18/20 169 lb (76.7 kg)  03/16/20 169 lb 4.8 oz (76.8 kg)    Lab Results  Component Value Date   WBC 6.3 03/16/2020   HGB 12.8 03/16/2020   HCT 38.5 03/16/2020   PLT 302.0 03/16/2020   GLUCOSE 99 03/16/2020   CHOL 220 (H) 03/16/2020   TRIG 69.0  03/16/2020   HDL 57.20 03/16/2020   LDLCALC 149 (H) 03/16/2020   ALT 11 03/16/2020   AST 13 03/16/2020   NA 139 03/16/2020   K 4.5 03/16/2020   CL 103 03/16/2020   CREATININE 0.56 03/16/2020   BUN 9 03/16/2020   CO2 30 03/16/2020   TSH 0.18 (L) 03/16/2020   HGBA1C 5.4 03/16/2020    Assessment/Plan:  Epidermoid cyst of skin of chest  -Consent obtained.  Sterile packing removed and replaced.  Patient tolerated procedure well -Patient advised to remove packing in 3 days -We will follow up in clinic on Tuesday at 2:30 pm for wound recheck and possible repacking -Given precautions  Grier Mitts, MD

## 2020-05-23 ENCOUNTER — Other Ambulatory Visit: Payer: Self-pay

## 2020-05-23 ENCOUNTER — Ambulatory Visit (INDEPENDENT_AMBULATORY_CARE_PROVIDER_SITE_OTHER): Payer: BC Managed Care – PPO

## 2020-05-23 ENCOUNTER — Telehealth: Payer: Self-pay | Admitting: Internal Medicine

## 2020-05-23 DIAGNOSIS — E538 Deficiency of other specified B group vitamins: Secondary | ICD-10-CM | POA: Diagnosis not present

## 2020-05-23 MED ORDER — CYANOCOBALAMIN 1000 MCG/ML IJ SOLN
1000.0000 ug | Freq: Once | INTRAMUSCULAR | Status: AC
  Administered 2020-05-23: 1000 ug via INTRAMUSCULAR

## 2020-05-23 NOTE — Telephone Encounter (Signed)
Pt would like to switch to Dr. Volanda Napoleon from Dr. Jerilee Hoh. Please advise if it's okay, for pt to switch provider. Thanks

## 2020-05-23 NOTE — Progress Notes (Addendum)
Per orders of Dr. Hernandez , injection of Cyanocobalamin 1,000 mcg/mL given by Kyllie Pettijohn N Jovonna Nickell. °Patient tolerated injection well. ° °

## 2020-05-24 NOTE — Telephone Encounter (Signed)
Ok with me 

## 2020-05-29 NOTE — Telephone Encounter (Signed)
Okay 

## 2020-06-01 NOTE — Telephone Encounter (Signed)
Spoke to pt and switched pt to Dr. Volanda Napoleon in the system.

## 2020-06-05 NOTE — Addendum Note (Signed)
Addended by: Marrion Coy on: 06/05/2020 03:33 PM   Modules accepted: Orders

## 2020-06-17 DIAGNOSIS — Z20822 Contact with and (suspected) exposure to covid-19: Secondary | ICD-10-CM | POA: Diagnosis not present

## 2020-06-20 ENCOUNTER — Other Ambulatory Visit: Payer: Self-pay

## 2020-06-20 ENCOUNTER — Other Ambulatory Visit: Payer: Self-pay | Admitting: Family Medicine

## 2020-06-20 ENCOUNTER — Other Ambulatory Visit: Payer: BC Managed Care – PPO

## 2020-06-20 DIAGNOSIS — E559 Vitamin D deficiency, unspecified: Secondary | ICD-10-CM

## 2020-06-20 LAB — VITAMIN D 25 HYDROXY (VIT D DEFICIENCY, FRACTURES): Vit D, 25-Hydroxy: 36 ng/mL (ref 30–100)

## 2020-06-23 ENCOUNTER — Ambulatory Visit (INDEPENDENT_AMBULATORY_CARE_PROVIDER_SITE_OTHER): Payer: BC Managed Care – PPO

## 2020-06-23 ENCOUNTER — Other Ambulatory Visit: Payer: Self-pay

## 2020-06-23 DIAGNOSIS — E538 Deficiency of other specified B group vitamins: Secondary | ICD-10-CM

## 2020-06-23 MED ORDER — CYANOCOBALAMIN 1000 MCG/ML IJ SOLN
1000.0000 ug | Freq: Once | INTRAMUSCULAR | Status: AC
  Administered 2020-06-23: 1000 ug via INTRAMUSCULAR

## 2020-06-23 NOTE — Progress Notes (Signed)
Per orders of Dr. Domingo Mend, patient came in for her vitamin B12 injection given in Right Deltoid today.  Patient tolerated the injection well.

## 2020-06-24 ENCOUNTER — Other Ambulatory Visit: Payer: Self-pay | Admitting: Internal Medicine

## 2020-06-24 DIAGNOSIS — E559 Vitamin D deficiency, unspecified: Secondary | ICD-10-CM

## 2020-06-27 NOTE — Telephone Encounter (Signed)
Dr Hernandez pt 

## 2020-06-30 DIAGNOSIS — Z20822 Contact with and (suspected) exposure to covid-19: Secondary | ICD-10-CM | POA: Diagnosis not present

## 2020-07-12 ENCOUNTER — Encounter: Payer: Self-pay | Admitting: Endocrinology

## 2020-07-12 ENCOUNTER — Other Ambulatory Visit: Payer: Self-pay

## 2020-07-12 ENCOUNTER — Ambulatory Visit (INDEPENDENT_AMBULATORY_CARE_PROVIDER_SITE_OTHER): Payer: BC Managed Care – PPO | Admitting: Endocrinology

## 2020-07-12 DIAGNOSIS — E0590 Thyrotoxicosis, unspecified without thyrotoxic crisis or storm: Secondary | ICD-10-CM

## 2020-07-12 LAB — T4, FREE: Free T4: 0.93 ng/dL (ref 0.60–1.60)

## 2020-07-12 LAB — TSH: TSH: 0.34 u[IU]/mL — ABNORMAL LOW (ref 0.35–4.50)

## 2020-07-12 NOTE — Patient Instructions (Addendum)
Blood tests are requested for you today.  We'll let you know about the results.  If it is no worse, we can continue to hold off on treatment. Please come back for a follow-up appointment in 6 months.        .   .           .      6 .

## 2020-07-12 NOTE — Progress Notes (Signed)
Subjective:    Patient ID: Linda Villanueva, female    DOB: February 19, 1971, 49 y.o.   MRN: [id number]  HPI Pt returns for f/u of hyperthyroidism (dx'ed 2020; she has never been on therapy for this;  she has never had thyroid imaging; as abnormal TFT were mild, pt declined rx).   She reports palpitations and excessive diaphoresis.   Past Medical History:  Diagnosis Date  . Vitamin B12 deficiency   . Vitamin D deficiency     No past surgical history on file.  Social History   Socioeconomic History  . Marital status: Married    Spouse name: Not on file  . Number of children: Not on file  . Years of education: Not on file  . Highest education level: Not on file  Occupational History  . Not on file  Tobacco Use  . Smoking status: Never Smoker  . Smokeless tobacco: Never Used  Vaping Use  . Vaping Use: Never used  Substance and Sexual Activity  . Alcohol use: No  . Drug use: No  . Sexual activity: Yes    Partners: Male    Birth control/protection: I.U.D.  Other Topics Concern  . Not on file  Social History Narrative  . Not on file   Social Determinants of Health   Financial Resource Strain:   . Difficulty of Paying Living Expenses: Not on file  Food Insecurity:   . Worried About Charity fundraiser in the Last Year: Not on file  . Ran Out of Food in the Last Year: Not on file  Transportation Needs:   . Lack of Transportation (Medical): Not on file  . Lack of Transportation (Non-Medical): Not on file  Physical Activity:   . Days of Exercise per Week: Not on file  . Minutes of Exercise per Session: Not on file  Stress:   . Feeling of Stress : Not on file  Social Connections:   . Frequency of Communication with Friends and Family: Not on file  . Frequency of Social Gatherings with Friends and Family: Not on file  . Attends Religious Services: Not on file  . Active Member of Clubs or Organizations: Not on file  . Attends Archivist Meetings: Not on file  .  Marital Status: Not on file  Intimate Partner Violence:   . Fear of Current or Ex-Partner: Not on file  . Emotionally Abused: Not on file  . Physically Abused: Not on file  . Sexually Abused: Not on file    Current Outpatient Medications on File Prior to Visit  Medication Sig Dispense Refill  . Cyanocobalamin 1000 MCG/ML KIT Inject 1,000 mcg as directed every 30 (thirty) days.    . cycloSPORINE (RESTASIS MULTIDOSE) 0.05 % ophthalmic emulsion Place 1 drop into both eyes 2 (two) times daily.    . Vitamin D, Ergocalciferol, (DRISDOL) 1.25 MG (50000 UNIT) CAPS capsule Take 50,000 Units by mouth every 7 (seven) days.     No current facility-administered medications on file prior to visit.    No Known Allergies  Family History  Problem Relation Age of Onset  . Hypertension Mother   . Hypertension Father   . Thyroid disease Neg Hx     BP 120/80   Pulse 83   Ht '5\' 7"'  (1.702 m)   Wt 167 lb 12.8 oz (76.1 kg)   SpO2 98%   BMI 26.28 kg/m    Review of Systems She has lost 2 lbs since last ov  Objective:   Physical Exam VITAL SIGNS:  See vs page GENERAL: no distress NECK: Thyroid is slightly enlarged, with irreg surface, but no thyroid nodule is palpable.  No palpable lymphadenopathy at the anterior neck.  Lab Results  Component Value Date   TSH 0.34 (L) 07/12/2020       Assessment & Plan:  Hyperthyroidism, stable off rx.  We'll follow  Patient Instructions  Blood tests are requested for you today.  We'll let you know about the results.  If it is no worse, we can continue to hold off on treatment. Please come back for a follow-up appointment in 6 months.        .   .           .      6 .

## 2020-07-25 ENCOUNTER — Other Ambulatory Visit: Payer: Self-pay

## 2020-07-25 ENCOUNTER — Ambulatory Visit (INDEPENDENT_AMBULATORY_CARE_PROVIDER_SITE_OTHER): Payer: BC Managed Care – PPO | Admitting: *Deleted

## 2020-07-25 DIAGNOSIS — E538 Deficiency of other specified B group vitamins: Secondary | ICD-10-CM

## 2020-07-25 MED ORDER — CYANOCOBALAMIN 1000 MCG/ML IJ SOLN
1000.0000 ug | Freq: Once | INTRAMUSCULAR | Status: AC
  Administered 2020-07-25: 1000 ug via INTRAMUSCULAR

## 2020-07-25 NOTE — Progress Notes (Signed)
Patient in office for B-12 injection. Injection administered with no immediate reaction.

## 2020-07-26 ENCOUNTER — Encounter: Payer: Self-pay | Admitting: Family Medicine

## 2020-07-26 ENCOUNTER — Ambulatory Visit (INDEPENDENT_AMBULATORY_CARE_PROVIDER_SITE_OTHER): Payer: BC Managed Care – PPO | Admitting: Family Medicine

## 2020-07-26 VITALS — BP 102/70 | HR 68 | Temp 98.1°F | Wt 171.0 lb

## 2020-07-26 DIAGNOSIS — E782 Mixed hyperlipidemia: Secondary | ICD-10-CM | POA: Diagnosis not present

## 2020-07-26 DIAGNOSIS — E559 Vitamin D deficiency, unspecified: Secondary | ICD-10-CM | POA: Diagnosis not present

## 2020-07-26 NOTE — Patient Instructions (Addendum)
Vitamin D Deficiency Vitamin D deficiency is when your body does not have enough vitamin D. Vitamin D is important to your body for many reasons:  It helps the body absorb two important minerals--calcium and phosphorus.  It plays a role in bone health.  It Aariel help to prevent some diseases, such as diabetes and multiple sclerosis.  It plays a role in muscle function, including heart function. If vitamin D deficiency is severe, it can cause a condition in which your bones become soft. In adults, this condition is called osteomalacia. In children, this condition is called rickets. What are the causes? This condition Quynh be caused by:  Not eating enough foods that contain vitamin D.  Not getting enough natural sun exposure.  Having certain digestive system diseases that make it difficult for your body to absorb vitamin D. These diseases include Crohn's disease, chronic pancreatitis, and cystic fibrosis.  Having a surgery in which a part of the stomach or a part of the small intestine is removed.  Having chronic kidney disease or liver disease. What increases the risk? You are more likely to develop this condition if you:  Are older.  Do not spend much time outdoors.  Live in a long-term care facility.  Have had broken bones.  Have weak or thin bones (osteoporosis).  Have a disease or condition that changes how the body absorbs vitamin D.  Have dark skin.  Take certain medicines, such as steroid medicines or certain seizure medicines.  Are overweight or obese. What are the signs or symptoms? In mild cases of vitamin D deficiency, there Aariana not be any symptoms. If the condition is severe, symptoms Esly include:  Bone pain.  Muscle pain.  Falling often.  Broken bones caused by a minor injury. How is this diagnosed? This condition Italia be diagnosed with blood tests. Imaging tests such as X-rays Tressie also be done to look for changes in the bone. How is this  treated? Treatment for this condition Analucia depend on what caused the condition. Treatment options include:  Taking vitamin D supplements. Your health care provider will suggest what dose is best for you.  Taking a calcium supplement. Your health care provider will suggest what dose is best for you. Follow these instructions at home: Eating and drinking   Eat foods that contain vitamin D. Choices include: ? Fortified dairy products, cereals, or juices. Fortified means that vitamin D has been added to the food. Check the label on the package to see if the food is fortified. ? Fatty fish, such as salmon or trout. ? Eggs. ? Oysters. ? Mushrooms. The items listed above Laisha not be a complete list of recommended foods and beverages. Contact a dietitian for more information. General instructions  Take medicines and supplements only as told by your health care provider.  Get regular, safe exposure to natural sunlight.  Do not use a tanning bed.  Maintain a healthy weight. Lose weight if needed.  Keep all follow-up visits as told by your health care provider. This is important. How is this prevented? You can get vitamin D by:  Eating foods that naturally contain vitamin D.  Eating or drinking products that have been fortified with vitamin D, such as cereals, juices, and dairy products (including milk).  Taking a vitamin D supplement or a multivitamin supplement that contains vitamin D.  Being in the sun. Your body naturally makes vitamin D when your skin is exposed to sunlight. Your body changes the sunlight into  a form of the vitamin that it can use. Contact a health care provider if:  Your symptoms do not go away.  You feel nauseous or you vomit.  You have fewer bowel movements than usual or are constipated. Summary  Vitamin D deficiency is when your body does not have enough vitamin D.  Vitamin D is important to your body for good bone health and muscle function, and it Daryle  help prevent some diseases.  Vitamin D deficiency is primarily treated through supplementation. Your health care provider will suggest what dose is best for you.  You can get vitamin D by eating foods that contain vitamin D, by being in the sun, and by taking a vitamin D supplement or a multivitamin supplement that contains vitamin D. This information is not intended to replace advice given to you by your health care provider. Make sure you discuss any questions you have with your health care provider. Document Revised: 07/13/2018 Document Reviewed: 07/13/2018 Elsevier Patient Education  [place].  High Cholesterol  High cholesterol is a condition in which the blood has high levels of a white, waxy, fat-like substance (cholesterol). The human body needs small amounts of cholesterol. The liver makes all the cholesterol that the body needs. Extra (excess) cholesterol comes from the food that we eat. Cholesterol is carried from the liver by the blood through the blood vessels. If you have high cholesterol, deposits (plaques) Sharmel build up on the walls of your blood vessels (arteries). Plaques make the arteries narrower and stiffer. Cholesterol plaques increase your risk for heart attack and stroke. Work with your health care provider to keep your cholesterol levels in a healthy range. What increases the risk? This condition is more likely to develop in people who:  Eat foods that are high in animal fat (saturated fat) or cholesterol.  Are overweight.  Are not getting enough exercise.  Have a family history of high cholesterol. What are the signs or symptoms? There are no symptoms of this condition. How is this diagnosed? This condition Lalah be diagnosed from the results of a blood test.  If you are older than age 40, your health care provider Itzy check your cholesterol every 4-6 years.  You Kemisha be checked more often if you already have high cholesterol or other risk factors for  heart disease. The blood test for cholesterol measures:  "Bad" cholesterol (LDL cholesterol). This is the main type of cholesterol that causes heart disease. The desired level for LDL is less than 100.  "Good" cholesterol (HDL cholesterol). This type helps to protect against heart disease by cleaning the arteries and carrying the LDL away. The desired level for HDL is 60 or higher.  Triglycerides. These are fats that the body can store or burn for energy. The desired number for triglycerides is lower than 150.  Total cholesterol. This is a measure of the total amount of cholesterol in your blood, including LDL cholesterol, HDL cholesterol, and triglycerides. A healthy number is less than 200. How is this treated? This condition is treated with diet changes, lifestyle changes, and medicines. Diet changes  This Harjit include eating more whole grains, fruits, vegetables, nuts, and fish.  This Haydan also include cutting back on red meat and foods that have a lot of added sugar. Lifestyle changes  Changes Loyda include getting at least 40 minutes of aerobic exercise 3 times a week. Aerobic exercises include walking, biking, and swimming. Aerobic exercise along with a healthy diet can help you  maintain a healthy weight.  Changes Yitta also include quitting smoking. Medicines  Medicines are usually given if diet and lifestyle changes have failed to reduce your cholesterol to healthy levels.  Your health care provider Cynitha prescribe a statin medicine. Statin medicines have been shown to reduce cholesterol, which can reduce the risk of heart disease. Follow these instructions at home: Eating and drinking If told by your health care provider:  Eat chicken (without skin), fish, veal, shellfish, ground [place] breast, and round or loin cuts of red meat.  Do not eat fried foods or fatty meats, such as hot dogs and salami.  Eat plenty of fruits, such as apples.  Eat plenty of vegetables, such as  broccoli, potatoes, and carrots.  Eat beans, peas, and lentils.  Eat grains such as barley, rice, couscous, and bulgur wheat.  Eat pasta without cream sauces.  Use skim or nonfat milk, and eat low-fat or nonfat yogurt and cheeses.  Do not eat or drink whole milk, cream, ice cream, egg yolks, or hard cheeses.  Do not eat stick margarine or tub margarines that contain trans fats (also called partially hydrogenated oils).  Do not eat saturated tropical oils, such as coconut oil and palm oil.  Do not eat cakes, cookies, crackers, or other baked goods that contain trans fats.  General instructions  Exercise as directed by your health care provider. Increase your activity level with activities such as gardening, walking, and taking the stairs.  Take over-the-counter and prescription medicines only as told by your health care provider.  Do not use any products that contain nicotine or tobacco, such as cigarettes and e-cigarettes. If you need help quitting, ask your health care provider.  Keep all follow-up visits as told by your health care provider. This is important. Contact a health care provider if:  You are struggling to maintain a healthy diet or weight.  You need help to start on an exercise program.  You need help to stop smoking. Get help right away if:  You have chest pain.  You have trouble breathing. This information is not intended to replace advice given to you by your health care provider. Make sure you discuss any questions you have with your health care provider. Document Revised: 11/07/2017 Document Reviewed: 05/04/2016 Elsevier Patient Education  [place].  Cholesterol Content in Foods Cholesterol is a waxy, fat-like substance that helps to carry fat in the blood. The body needs cholesterol in small amounts, but too much cholesterol can cause damage to the arteries and heart. Most people should eat less than 200 milligrams (mg) of cholesterol a  day. Foods with cholesterol  Cholesterol is found in animal-based foods, such as meat, seafood, and dairy. Generally, low-fat dairy and lean meats have less cholesterol than full-fat dairy and fatty meats. The milligrams of cholesterol per serving (mg per serving) of common cholesterol-containing foods are listed below. Meat and other proteins  Egg -- one large whole egg has 186 mg.  Veal shank -- 4 oz has 141 mg.  Lean ground [place] (93% lean) -- 4 oz has 118 mg.  Fat-trimmed lamb loin -- 4 oz has 106 mg.  Lean ground beef (90% lean) -- 4 oz has 100 mg.  Lobster -- 3.5 oz has 90 mg.  Pork loin chops -- 4 oz has 86 mg.  Canned salmon -- 3.5 oz has 83 mg.  Fat-trimmed beef top loin -- 4 oz has 78 mg.  Frankfurter -- 1 frank (3.5 oz) has  77 mg.  Crab -- 3.5 oz has 71 mg.  Roasted chicken without skin, white meat -- 4 oz has 66 mg.  Light bologna -- 2 oz has 45 mg.  Deli-cut [place] -- 2 oz has 31 mg.  Canned tuna -- 3.5 oz has 31 mg.  Berniece Salines -- 1 oz has 29 mg.  Oysters and mussels (raw) -- 3.5 oz has 25 mg.  Mackerel -- 1 oz has 22 mg.  Trout -- 1 oz has 20 mg.  Pork sausage -- 1 link (1 oz) has 17 mg.  Salmon -- 1 oz has 16 mg.  Tilapia -- 1 oz has 14 mg. Dairy  Soft-serve ice cream --  cup (4 oz) has 103 mg.  Whole-milk yogurt -- 1 cup (8 oz) has 29 mg.  Cheddar cheese -- 1 oz has 28 mg.  American cheese -- 1 oz has 28 mg.  Whole milk -- 1 cup (8 oz) has 23 mg.  2% milk -- 1 cup (8 oz) has 18 mg.  Cream cheese -- 1 tablespoon (Tbsp) has 15 mg.  Cottage cheese --  cup (4 oz) has 14 mg.  Low-fat (1%) milk -- 1 cup (8 oz) has 10 mg.  Sour cream -- 1 Tbsp has 8.5 mg.  Low-fat yogurt -- 1 cup (8 oz) has 8 mg.  Nonfat Greek yogurt -- 1 cup (8 oz) has 7 mg.  Half-and-half cream -- 1 Tbsp has 5 mg. Fats and oils  Cod liver oil -- 1 tablespoon (Tbsp) has 82 mg.  Butter -- 1 Tbsp has 15 mg.  Lard -- 1 Tbsp has 14 mg.  Bacon grease -- 1 Tbsp  has 14 mg.  Mayonnaise -- 1 Tbsp has 5-10 mg.  Margarine -- 1 Tbsp has 3-10 mg. Exact amounts of cholesterol in these foods Allaya vary depending on specific ingredients and brands. Foods without cholesterol Most plant-based foods do not have cholesterol unless you combine them with a food that has cholesterol. Foods without cholesterol include:  Grains and cereals.  Vegetables.  Fruits.  Vegetable oils, such as olive, canola, and sunflower oil.  Legumes, such as peas, beans, and lentils.  Nuts and seeds.  Egg whites. Summary  The body needs cholesterol in small amounts, but too much cholesterol can cause damage to the arteries and heart.  Most people should eat less than 200 milligrams (mg) of cholesterol a day. This information is not intended to replace advice given to you by your health care provider. Make sure you discuss any questions you have with your health care provider. Document Revised: 10/17/2017 Document Reviewed: 07/01/2017 Elsevier Patient Education  [place].

## 2020-07-26 NOTE — Progress Notes (Signed)
Subjective:    Patient ID: Linda Villanueva, female    DOB: 08/20/1971, 49 y.o.   MRN: [id number]  No chief complaint on file.   HPI Patient was seen today for f/u.  Pt here for lipid recheck and Vit D check.  Patient endorses making changes to diet with hopes of improving cholesterol.  Total cholesterol was 220 on 03/16/20.  Patient also notes history of vitamin D deficiency previously on ergocalciferol 50,000 IU.  Inquires if she needs to continue the medication.  Patient also has a history of vitamin B12 deficiency.  Was seen yesterday in clinic for B12 injection.    Also mentions recent TSH check.  Seen by endocrinology for subclinical hyperthyroidism.  Not currently on medication.  Past Medical History:  Diagnosis Date   Vitamin B12 deficiency    Vitamin D deficiency     No Known Allergies  ROS General: Denies fever, chills, night sweats, changes in weight, changes in appetite HEENT: Denies headaches, ear pain, changes in vision, rhinorrhea, sore throat CV: Denies CP, palpitations, SOB, orthopnea Pulm: Denies SOB, cough, wheezing GI: Denies abdominal pain, nausea, vomiting, diarrhea, constipation GU: Denies dysuria, hematuria, frequency, vaginal discharge Msk: Denies muscle cramps, joint pains Neuro: Denies weakness, numbness, tingling Skin: Denies rashes, bruising Psych: Denies depression, anxiety, hallucinations     Objective:    Blood pressure 102/70, pulse 68, temperature 98.1 F (36.7 C), temperature source Oral, weight 171 lb (77.6 kg), SpO2 99 %.   Gen. Pleasant, well-nourished, in no distress, normal affect   HEENT: [place]/AT, face symmetric, conjunctiva clear, no scleral icterus, PERRLA, EOMI, nares patent without drainage Lungs: no accessory muscle use Cardiovascular: RRR, no peripheral edema Neuro:  A&Ox3, CN II-XII intact, normal gait Skin:  Warm, no lesions/ rash   Wt Readings from Last 3 Encounters:  07/26/20 171 lb (77.6 kg)  07/12/20 167 lb 12.8 oz  (76.1 kg)  05/12/20 170 lb (77.1 kg)    Lab Results  Component Value Date   WBC 6.3 03/16/2020   HGB 12.8 03/16/2020   HCT 38.5 03/16/2020   PLT 302.0 03/16/2020   GLUCOSE 99 03/16/2020   CHOL 220 (H) 03/16/2020   TRIG 69.0 03/16/2020   HDL 57.20 03/16/2020   LDLCALC 149 (H) 03/16/2020   ALT 11 03/16/2020   AST 13 03/16/2020   NA 139 03/16/2020   K 4.5 03/16/2020   CL 103 03/16/2020   CREATININE 0.56 03/16/2020   BUN 9 03/16/2020   CO2 30 03/16/2020   TSH 0.34 (L) 07/12/2020   HGBA1C 5.4 03/16/2020    Assessment/Plan:  Mixed hyperlipidemia  -Continue lifestyle modifications -Given handout - Plan: Lipid panel  Vitamin D deficiency  -Discussed ways to improve vitamin D -Given handout - Plan: Vitamin D, 25-hydroxy  F/u as needed  Grier Mitts, MD

## 2020-07-27 LAB — LIPID PANEL
Cholesterol: 215 mg/dL — ABNORMAL HIGH (ref <200)
HDL: 62 mg/dL (ref >=50)
LDL Cholesterol (Calc): 134 mg/dL (calc) — ABNORMAL HIGH
Non-HDL Cholesterol (Calc): 153 mg/dL (calc) — ABNORMAL HIGH (ref <130)
Total CHOL/HDL Ratio: 3.5 (calc) (ref <5.0)
Triglycerides: 88 mg/dL (ref <150)

## 2020-07-27 LAB — VITAMIN D 25 HYDROXY (VIT D DEFICIENCY, FRACTURES): Vit D, 25-Hydroxy: 38 ng/mL (ref 30–100)

## 2020-08-22 DIAGNOSIS — Z20822 Contact with and (suspected) exposure to covid-19: Secondary | ICD-10-CM | POA: Diagnosis not present

## 2020-08-25 ENCOUNTER — Other Ambulatory Visit: Payer: BC Managed Care – PPO

## 2020-08-26 DIAGNOSIS — Z20822 Contact with and (suspected) exposure to covid-19: Secondary | ICD-10-CM | POA: Diagnosis not present

## 2020-10-05 ENCOUNTER — Telehealth: Payer: Self-pay | Admitting: Family Medicine

## 2020-10-05 NOTE — Telephone Encounter (Signed)
Patient is calling and wanted to see when the next time she is supposed to get her B12 shot, please advise. CB is [phone]

## 2020-10-10 ENCOUNTER — Other Ambulatory Visit: Payer: Self-pay

## 2020-10-10 ENCOUNTER — Telehealth: Payer: Self-pay

## 2020-10-10 ENCOUNTER — Ambulatory Visit (INDEPENDENT_AMBULATORY_CARE_PROVIDER_SITE_OTHER): Payer: BC Managed Care – PPO

## 2020-10-10 DIAGNOSIS — E538 Deficiency of other specified B group vitamins: Secondary | ICD-10-CM | POA: Diagnosis not present

## 2020-10-10 DIAGNOSIS — Z23 Encounter for immunization: Secondary | ICD-10-CM

## 2020-10-10 MED ORDER — CYANOCOBALAMIN 1000 MCG/ML IJ SOLN
1000.0000 ug | Freq: Once | INTRAMUSCULAR | Status: AC
  Administered 2020-10-10: 1000 ug via INTRAMUSCULAR

## 2020-10-10 NOTE — Progress Notes (Signed)
Per orders of Dr. Banks, injection of Cyanocobalamin 1,000 mcg/mL given by Kaliopi Blyden N Faiz Weber. Patient tolerated injection well.  

## 2020-10-10 NOTE — Telephone Encounter (Signed)
Pt is scheduled for her appointment on 10/10/2020

## 2020-10-10 NOTE — Telephone Encounter (Signed)
Message sen to Dr Volanda Napoleon for advise

## 2020-10-14 DIAGNOSIS — H5213 Myopia, bilateral: Secondary | ICD-10-CM | POA: Diagnosis not present

## 2020-10-16 NOTE — Telephone Encounter (Signed)
Pt can start taking over the counter vitamin B12 1,000-2,000s IU daily then have B12 level checked in the next 1-2 months.    (Lab needs to be ordered)

## 2020-10-18 ENCOUNTER — Other Ambulatory Visit: Payer: Self-pay

## 2020-10-18 DIAGNOSIS — E538 Deficiency of other specified B group vitamins: Secondary | ICD-10-CM

## 2020-10-18 NOTE — Telephone Encounter (Signed)
Spoke with pt verbalized understanding of Dr  Volanda Napoleon advise. Pt is scheduled for lab on 01/02/2020 at 3 pm. Order placed on Epic

## 2020-10-19 ENCOUNTER — Other Ambulatory Visit: Payer: Self-pay

## 2020-10-19 ENCOUNTER — Ambulatory Visit (INDEPENDENT_AMBULATORY_CARE_PROVIDER_SITE_OTHER): Payer: BC Managed Care – PPO | Admitting: Endocrinology

## 2020-10-19 ENCOUNTER — Encounter: Payer: Self-pay | Admitting: Endocrinology

## 2020-10-19 VITALS — BP 120/62 | HR 70 | Ht 67.0 in | Wt 169.0 lb

## 2020-10-19 DIAGNOSIS — E0590 Thyrotoxicosis, unspecified without thyrotoxic crisis or storm: Secondary | ICD-10-CM

## 2020-10-19 LAB — T4, FREE: Free T4: 0.81 ng/dL (ref 0.60–1.60)

## 2020-10-19 LAB — TSH: TSH: 0.19 u[IU]/mL — ABNORMAL LOW (ref 0.35–4.50)

## 2020-10-19 NOTE — Progress Notes (Signed)
Subjective:    Patient ID: Linda Villanueva, female    DOB: 1971/10/07, 49 y.o.   MRN: [id number]  HPI Pt returns for f/u of hyperthyroidism (dx'ed 2020; she has never been on therapy for this; she has never had thyroid imaging; as abnormal TFT were mild, pt declined rx).  pt states she feels well in general.  Specifically, she denies palpitations and excessive diaphoresis.   Past Medical History:  Diagnosis Date  . Vitamin B12 deficiency   . Vitamin D deficiency     No past surgical history on file.  Social History   Socioeconomic History  . Marital status: Married    Spouse name: Not on file  . Number of children: Not on file  . Years of education: Not on file  . Highest education level: Not on file  Occupational History  . Not on file  Tobacco Use  . Smoking status: Never Smoker  . Smokeless tobacco: Never Used  Vaping Use  . Vaping Use: Never used  Substance and Sexual Activity  . Alcohol use: No  . Drug use: No  . Sexual activity: Yes    Partners: Male    Birth control/protection: I.U.D.  Other Topics Concern  . Not on file  Social History Narrative  . Not on file   Social Determinants of Health   Financial Resource Strain:   . Difficulty of Paying Living Expenses: Not on file  Food Insecurity:   . Worried About Charity fundraiser in the Last Year: Not on file  . Ran Out of Food in the Last Year: Not on file  Transportation Needs:   . Lack of Transportation (Medical): Not on file  . Lack of Transportation (Non-Medical): Not on file  Physical Activity:   . Days of Exercise per Week: Not on file  . Minutes of Exercise per Session: Not on file  Stress:   . Feeling of Stress : Not on file  Social Connections:   . Frequency of Communication with Friends and Family: Not on file  . Frequency of Social Gatherings with Friends and Family: Not on file  . Attends Religious Services: Not on file  . Active Member of Clubs or Organizations: Not on file  . Attends  Archivist Meetings: Not on file  . Marital Status: Not on file  Intimate Partner Violence:   . Fear of Current or Ex-Partner: Not on file  . Emotionally Abused: Not on file  . Physically Abused: Not on file  . Sexually Abused: Not on file    Current Outpatient Medications on File Prior to Visit  Medication Sig Dispense Refill  . cycloSPORINE (RESTASIS MULTIDOSE) 0.05 % ophthalmic emulsion Place 1 drop into both eyes 2 (two) times daily.     No current facility-administered medications on file prior to visit.    No Known Allergies  Family History  Problem Relation Age of Onset  . Hypertension Mother   . Hypertension Father   . Thyroid disease Sister     BP 120/62   Pulse 70   Ht 5\' 7"  (1.702 m)   Wt 169 lb (76.7 kg)   SpO2 97%   BMI 26.47 kg/m    Review of Systems     Objective:   Physical Exam VITAL SIGNS:  See vs page GENERAL: no distress NECK: Thyroid is slightly and diffusely enlarged, but no thyroid nodule is palpable.     Lab Results  Component Value Date   TSH 0.19 (  L) 10/19/2020      Assessment & Plan:  Hyperthyroidism: uncontrolled.  As pt declines rx unless TSH is much worse, we'll defer rx for now

## 2020-10-19 NOTE — Patient Instructions (Addendum)
Blood tests are requested for you today.  We'll let you know about the results.  If it is about the same, we can continue to hold off on treatment. Please come back for a follow-up appointment in 6 months.         .  .            .      6 .

## 2020-12-25 NOTE — Addendum Note (Signed)
Addended by: Marrion Coy on: 12/25/2020 09:20 AM   Modules accepted: Orders

## 2021-01-01 ENCOUNTER — Other Ambulatory Visit (INDEPENDENT_AMBULATORY_CARE_PROVIDER_SITE_OTHER): Payer: Medicaid Other

## 2021-01-01 ENCOUNTER — Other Ambulatory Visit: Payer: Self-pay

## 2021-01-01 DIAGNOSIS — E538 Deficiency of other specified B group vitamins: Secondary | ICD-10-CM

## 2021-01-02 LAB — VITAMIN B12: Vitamin B-12: 453 pg/mL (ref 211–911)

## 2021-01-04 DIAGNOSIS — F331 Major depressive disorder, recurrent, moderate: Secondary | ICD-10-CM | POA: Diagnosis not present

## 2021-01-09 ENCOUNTER — Ambulatory Visit: Payer: BC Managed Care – PPO | Admitting: Endocrinology

## 2021-01-10 DIAGNOSIS — F331 Major depressive disorder, recurrent, moderate: Secondary | ICD-10-CM | POA: Diagnosis not present

## 2021-04-18 ENCOUNTER — Ambulatory Visit: Payer: BC Managed Care – PPO | Admitting: Endocrinology

## 2021-04-19 ENCOUNTER — Ambulatory Visit: Payer: BC Managed Care – PPO | Admitting: Endocrinology

## 2021-06-04 DIAGNOSIS — Z30011 Encounter for initial prescription of contraceptive pills: Secondary | ICD-10-CM | POA: Diagnosis not present

## 2021-06-04 DIAGNOSIS — Z309 Encounter for contraceptive management, unspecified: Secondary | ICD-10-CM | POA: Diagnosis not present

## 2021-06-04 DIAGNOSIS — Z30432 Encounter for removal of intrauterine contraceptive device: Secondary | ICD-10-CM | POA: Diagnosis not present

## 2021-06-04 DIAGNOSIS — B356 Tinea cruris: Secondary | ICD-10-CM | POA: Diagnosis not present

## 2021-07-16 ENCOUNTER — Other Ambulatory Visit: Payer: Self-pay

## 2021-07-16 ENCOUNTER — Ambulatory Visit (INDEPENDENT_AMBULATORY_CARE_PROVIDER_SITE_OTHER): Payer: Medicaid Other | Admitting: Family Medicine

## 2021-07-16 ENCOUNTER — Encounter: Payer: Self-pay | Admitting: Family Medicine

## 2021-07-16 VITALS — BP 110/76 | HR 63 | Temp 98.3°F | Wt 168.8 lb

## 2021-07-16 DIAGNOSIS — E538 Deficiency of other specified B group vitamins: Secondary | ICD-10-CM | POA: Diagnosis not present

## 2021-07-16 DIAGNOSIS — R202 Paresthesia of skin: Secondary | ICD-10-CM | POA: Diagnosis not present

## 2021-07-16 DIAGNOSIS — E559 Vitamin D deficiency, unspecified: Secondary | ICD-10-CM | POA: Diagnosis not present

## 2021-07-16 DIAGNOSIS — R21 Rash and other nonspecific skin eruption: Secondary | ICD-10-CM | POA: Diagnosis not present

## 2021-07-16 DIAGNOSIS — E0590 Thyrotoxicosis, unspecified without thyrotoxic crisis or storm: Secondary | ICD-10-CM

## 2021-07-16 MED ORDER — METRONIDAZOLE 1 % EX GEL: CUTANEOUS | 0 refills | Status: DC

## 2021-07-16 NOTE — Progress Notes (Signed)
Subjective:    Patient ID: Linda Villanueva, female    DOB: [birth year], 50 y.o.   MRN: [id number]  Chief Complaint  Patient presents with   Facial Pain    Face, mouth, and eyes get really numb and red. Has happened a few time the last few months. Has taken aspirin. Happens when she is very upset, but she has not had this problem before     HPI Patient was seen today for ongoing concern. Pt endorses face becoming flush with tingling of mouth, and redness of eyes when becomes upset.  First occurred years ago.  Noticed the same sensation a few wks ago when she was not upset.  Pt endorses increased stress at work.  Notes a rash on b/l cheeks that can become red for a day. Sister has similar rash.  Pt denies fam h/o autoimmune d/o.  Pt states rash not related to wearing a mask as she does not have to wear one at work.  Pt works in a daycare, left x 1 month, but recently returned.  Pt inquires is she needs a Neuro referral.  Patient denies recent labs to reevaluate thyroid dysfunction, vitamin B12 and vitamin D deficiency.  Past Medical History:  Diagnosis Date   Vitamin B12 deficiency    Vitamin D deficiency     No Known Allergies  ROS General: Denies fever, chills, night sweats, changes in weight, changes in appetite +flush sensation HEENT: Denies headaches, ear pain, changes in vision, rhinorrhea, sore throat CV: Denies CP, palpitations, SOB, orthopnea Pulm: Denies SOB, cough, wheezing GI: Denies abdominal pain, nausea, vomiting, diarrhea, constipation GU: Denies dysuria, hematuria, frequency, vaginal discharge Msk: Denies muscle cramps, joint pains Neuro: Denies weakness, numbness +tingling of face after becoming upset. Skin: Denies rashes, bruising +rash on b/l cheeks Psych: Denies depression, anxiety, hallucinations     Objective:    Blood pressure 110/76, pulse 63, temperature 98.3 F (36.8 C), temperature source Oral, weight 168 lb 12.8 oz (76.6 kg), SpO2 99 %.  Gen. Pleasant,  well-nourished, in no distress, normal affect   HEENT: [place]/AT, face symmetric, conjunctiva clear, no scleral icterus, PERRLA, EOMI, nares patent without drainage Lungs: no accessory muscle use, CTAB, no wheezes or rales Cardiovascular: RRR, no m/r/g, no peripheral edema Musculoskeletal: No deformities, no cyanosis or clubbing, normal tone Neuro:  A&Ox3, CN II-XII intact, normal gait Skin:  Warm, dry, intact.  Faintly erythematous rash on bilateral cheeks with small capillaries present with a small area crossing bridge of nose     Wt Readings from Last 3 Encounters:  10/19/20 169 lb (76.7 kg)  07/26/20 171 lb (77.6 kg)  07/12/20 167 lb 12.8 oz (76.1 kg)    Lab Results  Component Value Date   WBC 6.3 03/16/2020   HGB 12.8 03/16/2020   HCT 38.5 03/16/2020   PLT 302.0 03/16/2020   GLUCOSE 99 03/16/2020   CHOL 215 (H) 07/26/2020   TRIG 88 07/26/2020   HDL 62 07/26/2020   LDLCALC 134 (H) 07/26/2020   ALT 11 03/16/2020   AST 13 03/16/2020   NA 139 03/16/2020   K 4.5 03/16/2020   CL 103 03/16/2020   CREATININE 0.56 03/16/2020   BUN 9 03/16/2020   CO2 30 03/16/2020   TSH 0.19 (L) 10/19/2020   HGBA1C 5.4 03/16/2020    Assessment/Plan:  Paresthesia -Likely 2/2 flushing when emotional.  Discussed other causes including thyroid dysfunction, vitamin or electrolyte abnormalities. -We will check calcium and other labs -For continued symptoms refer to  neurology - Plan: TSH, CMP, vitamin B12  Vitamin D deficiency - Plan: Vitamin D, 25-hydroxy  Vitamin B12 deficiency - Plan: Vitamin B12  Hyperthyroidism - Plan: TSH  Rash of face  -Rosacea versus lupus-like butterfly rash -We will start MetroGel 1% daily -SLE analysis to rule out lupus. -Given info - Plan: Lupus (SLE) Analysis, metroNIDAZOLE (METROGEL) 1 % gel  F/u as needed  Grier Mitts, MD

## 2021-07-17 LAB — COMPREHENSIVE METABOLIC PANEL
ALT: 13 U/L (ref 0–35)
AST: 15 U/L (ref 0–37)
Albumin: 4 g/dL (ref 3.5–5.2)
Alkaline Phosphatase: 75 U/L (ref 39–117)
BUN: 9 mg/dL (ref 6–23)
CO2: 27 mEq/L (ref 19–32)
Calcium: 9 mg/dL (ref 8.4–10.5)
Chloride: 103 mEq/L (ref 96–112)
Creatinine, Ser: 0.55 mg/dL (ref 0.40–1.20)
GFR: 108.21 mL/min (ref >60.00)
Glucose, Bld: 82 mg/dL (ref 70–99)
Potassium: 4.1 mEq/L (ref 3.5–5.1)
Sodium: 137 mEq/L (ref 135–145)
Total Bilirubin: 0.4 mg/dL (ref 0.2–1.2)
Total Protein: 6.9 g/dL (ref 6.0–8.3)

## 2021-07-17 LAB — VITAMIN B12: Vitamin B-12: 388 pg/mL (ref 211–911)

## 2021-07-17 LAB — TSH: TSH: 0.36 u[IU]/mL (ref 0.35–5.50)

## 2021-07-17 LAB — VITAMIN D 25 HYDROXY (VIT D DEFICIENCY, FRACTURES): VITD: 37.07 ng/mL (ref 30.00–100.00)

## 2021-07-23 LAB — LUPUS (SLE) ANALYSIS
Anti Nuclear Antibody (ANA): NEGATIVE
Complement C4, Serum: 42 mg/dL — ABNORMAL HIGH (ref 12–38)
ENA RNP Ab: <0.2 AI (ref 0.0–0.9)
ENA SM Ab Ser-aCnc: <0.2 AI (ref 0.0–0.9)
ENA SSA (RO) Ab: <0.2 AI (ref 0.0–0.9)
ENA SSB (LA) Ab: <0.2 AI (ref 0.0–0.9)
Mitochondrial Ab: <20 Units (ref 0.0–20.0)
Parietal Cell Ab: 1.6 Units (ref 0.0–20.0)
Scleroderma (Scl-70) (ENA) Antibody, IgG: <0.2 AI (ref 0.0–0.9)
Smooth Muscle Ab: 4 Units (ref 0–19)
Thyroperoxidase Ab SerPl-aCnc: <8 IU/mL (ref 0–34)
dsDNA Ab: 1 IU/mL (ref 0–9)

## 2021-07-26 DIAGNOSIS — Z20822 Contact with and (suspected) exposure to covid-19: Secondary | ICD-10-CM | POA: Diagnosis not present

## 2021-08-22 ENCOUNTER — Other Ambulatory Visit: Payer: Self-pay

## 2021-08-23 ENCOUNTER — Ambulatory Visit (INDEPENDENT_AMBULATORY_CARE_PROVIDER_SITE_OTHER): Payer: Medicaid Other | Admitting: Family Medicine

## 2021-08-23 ENCOUNTER — Encounter: Payer: Self-pay | Admitting: Family Medicine

## 2021-08-23 VITALS — BP 114/78 | HR 65 | Temp 98.4°F | Wt 171.4 lb

## 2021-08-23 DIAGNOSIS — L918 Other hypertrophic disorders of the skin: Secondary | ICD-10-CM | POA: Diagnosis not present

## 2021-08-23 DIAGNOSIS — Z8739 Personal history of other diseases of the musculoskeletal system and connective tissue: Secondary | ICD-10-CM

## 2021-08-23 DIAGNOSIS — L720 Epidermal cyst: Secondary | ICD-10-CM | POA: Diagnosis not present

## 2021-08-23 DIAGNOSIS — S46811A Strain of other muscles, fascia and tendons at shoulder and upper arm level, right arm, initial encounter: Secondary | ICD-10-CM

## 2021-08-23 DIAGNOSIS — M5416 Radiculopathy, lumbar region: Secondary | ICD-10-CM

## 2021-08-23 DIAGNOSIS — R58 Hemorrhage, not elsewhere classified: Secondary | ICD-10-CM | POA: Diagnosis not present

## 2021-08-23 NOTE — Progress Notes (Signed)
Subjective:    Patient ID: Linda Villanueva, female    DOB: 02-03-1971, 50 y.o.   MRN: [id number]  Chief Complaint  Patient presents with   Back Pain    Mark on back, it does not hurt but has pain above and below mark, noticed 2 wks ago. Took tylenol, helped some    HPI Patient was seen today for ongoing concerns.  Pt notes a mark on R upper back present x at least 2 wks.  Pt denies increased activity, injury, hearing any pops, clicks, or tears.  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  Has soreness around the area.  Also with a bump on back and  lower back pain at times going into her R leg.  Tried tylenol.  Notes being told she had a herniated disc when [place].    Past Medical History:  Diagnosis Date   Vitamin B12 deficiency    Vitamin D deficiency     No Known Allergies  ROS General: Denies fever, chills, night sweats, changes in weight, changes in appetite HEENT: Denies headaches, ear pain, changes in vision, rhinorrhea, sore throat CV: Denies CP, palpitations, SOB, orthopnea Pulm: Denies SOB, cough, wheezing GI: Denies abdominal pain, nausea, vomiting, diarrhea, constipation GU: Denies dysuria, hematuria, frequency, vaginal discharge Msk: Denies muscle cramps, joint pains +Upper R back soreness, low back pain into RLE Neuro: Denies weakness, numbness, tingling Skin: Denies rashes, bruising +bump on back, Epidermoid cyst L upper chest Psych: Denies depression, anxiety, hallucinations     Objective:    Blood pressure 114/78, pulse 65, temperature 98.4 F (36.9 C), temperature source Oral, weight 171 lb 6.4 oz (77.7 kg), SpO2 97 %.  Gen. Pleasant, well-nourished, in no distress, normal affect   HEENT: /AT, face symmetric, conjunctiva clear, no scleral icterus, PERRLA, EOMI, nares patent without drainage Lungs: no accessory muscle use, CTAB, no wheezes or rales Cardiovascular: RRR, no m/r/g, no peripheral  edema Musculoskeletal: TTP of R upper back inferior and superior to area of ecchymosis.  No TTP of low back. No deformities, no cyanosis or clubbing, normal tone Neuro:  A&Ox3, CN II-XII intact, normal gait Skin:  Warm, dry, intact.  Ecchymosis on R medial upper back. Skin tag back.  Epidermoid cyst L upper chest recurring.   Wt Readings from Last 3 Encounters:  08/23/21 171 lb 6.4 oz (77.7 kg)  07/16/21 168 lb 12.8 oz (76.6 kg)  10/19/20 169 lb (76.7 kg)    Lab Results  Component Value Date   WBC 6.3 03/16/2020   HGB 12.8 03/16/2020   HCT 38.5 03/16/2020   PLT 302.0 03/16/2020   GLUCOSE 82 07/16/2021   CHOL 215 (H) 07/26/2020   TRIG 88 07/26/2020   HDL 62 07/26/2020   LDLCALC 134 (H) 07/26/2020   ALT 13 07/16/2021   AST 15 07/16/2021   NA 137 07/16/2021   K 4.1 07/16/2021   CL 103 07/16/2021   CREATININE 0.55 07/16/2021   BUN 9 07/16/2021   CO2 27 07/16/2021   TSH 0.36 07/16/2021   HGBA1C 5.4 03/16/2020    Assessment/Plan:  Ecchymosis -possibly 2/2 increased lifting of kids after return from vacation -supportive care including heat, ice, massage, etc  Skin tag -discussed removal options.  -pt wishes to wait at this time. -given handout  History of herniated intervertebral disc  - Plan: Ambulatory referral to Physical Therapy, MR LUMBAR SPINE WO CONTRAST  Lumbar  back pain with radiculopathy affecting right lower extremity  -supportive care including heat, ice, massage, topical analgesics, NSAIDs or Tylenol, PT - Plan: Ambulatory referral to Physical Therapy, MR LUMBAR SPINE WO CONTRAST  Strain of right trapezius muscle, initial encounter  - Plan: Ambulatory referral to Physical Therapy  Epidermoid cyst of skin of chest -discussed repeat I&D . Pt wishes to wait.  F/u prn  Grier Mitts, MD

## 2021-10-04 DIAGNOSIS — R52 Pain, unspecified: Secondary | ICD-10-CM | POA: Diagnosis not present

## 2021-10-04 DIAGNOSIS — R5383 Other fatigue: Secondary | ICD-10-CM | POA: Diagnosis not present

## 2021-10-04 DIAGNOSIS — R6889 Other general symptoms and signs: Secondary | ICD-10-CM | POA: Diagnosis not present

## 2021-11-28 ENCOUNTER — Encounter: Payer: Self-pay | Admitting: Family Medicine

## 2021-11-28 ENCOUNTER — Ambulatory Visit (INDEPENDENT_AMBULATORY_CARE_PROVIDER_SITE_OTHER): Payer: Medicaid Other | Admitting: Family Medicine

## 2021-11-28 VITALS — BP 120/82 | HR 75 | Temp 98.4°F | Wt 171.4 lb

## 2021-11-28 DIAGNOSIS — R5383 Other fatigue: Secondary | ICD-10-CM

## 2021-11-28 DIAGNOSIS — R590 Localized enlarged lymph nodes: Secondary | ICD-10-CM | POA: Diagnosis not present

## 2021-11-28 DIAGNOSIS — E0590 Thyrotoxicosis, unspecified without thyrotoxic crisis or storm: Secondary | ICD-10-CM

## 2021-11-28 DIAGNOSIS — R21 Rash and other nonspecific skin eruption: Secondary | ICD-10-CM | POA: Diagnosis not present

## 2021-11-28 DIAGNOSIS — R2232 Localized swelling, mass and lump, left upper limb: Secondary | ICD-10-CM

## 2021-11-28 DIAGNOSIS — H6122 Impacted cerumen, left ear: Secondary | ICD-10-CM | POA: Diagnosis not present

## 2021-11-28 MED ORDER — METRONIDAZOLE 1 % EX GEL: CUTANEOUS | 0 refills | Status: DC

## 2021-11-28 NOTE — Patient Instructions (Signed)
You can try over the counter debrox ear drops to help with the wax in your ear.

## 2021-11-28 NOTE — Progress Notes (Signed)
Subjective:    Patient ID: Linda Villanueva, female    DOB: 09/19/1971, 51 y.o.   MRN: [id number]  Chief Complaint  Patient presents with   Fatigue    Something happened on left hand about 2 months, and left cheek.10 days ago.  Pt accompanied by her husband.  HPI Patient was seen today for ongoing concerns.  Pt endorses continued fatigue after being sick last month with the flu.  Pt works in a daycare.  Pt states appetite just started returning.    Endorses bump in palm of left hand.  It is not painful.  Does not have erythema or edema.  Patient does not have difficulty making a fist or extending fingers.  A few months.  Has since noticed about in front of left ear x10 days.  Also with a dull 2/10 left ear pain x a little over 1 week.  Patient does not recall having recent thyroid check.  Pt mentions she was unable to pick up cream for face as prescribed at last visit.  Now has a new pharmacy, [facility] [place] [address]. Past Medical History:  Diagnosis Date   Vitamin B12 deficiency    Vitamin D deficiency     No Known Allergies  ROS General: Denies fever, chills, night sweats, changes in weight, changes in appetite  + fatigue HEENT: Denies headaches, ear pain, changes in vision, rhinorrhea, sore throat + left ear pain. CV: Denies CP, palpitations, SOB, orthopnea Pulm: Denies SOB, cough, wheezing GI: Denies abdominal pain, nausea, vomiting, diarrhea, constipation GU: Denies dysuria, hematuria, frequency, vaginal discharge Msk: Denies muscle cramps, joint pains Neuro: Denies weakness, numbness, tingling Skin: Denies rashes, bruising  + bump in palm of left hand and in front of left ear Psych: Denies depression, anxiety, hallucinations     Objective:    Blood pressure 120/82, pulse 75, temperature 98.4 F (36.9 C), temperature source Oral, weight 171 lb 6.4 oz (77.7 kg), SpO2 98 %.  Gen. Pleasant, well-nourished, in no distress, normal affect   HEENT: [place]/AT, face  symmetric, conjunctiva clear, no scleral icterus, PERRLA, EOMI, nares patent without drainage, pharynx without erythema or exudate.  Right TM full  Left canal occluded with firm appearing cerumen.  Unable to visualize left TM after irrigation 2/2 continued cerumen impaction.  L preauricular lymphadenopathy, nontender.  No cervical lymphadenopathy. Lungs: no accessory muscle use, CTAB, no wheezes or rales Cardiovascular: RRR, no m/r/g, no peripheral edema Musculoskeletal: No deformities, no cyanosis or clubbing, normal tone Neuro:  A&Ox3, CN II-XII intact, normal gait Skin:  Warm, no lesions/ rash.  A firm round mobile nodule in palm of left hand at the DIP joint, 4 mm.   Wt Readings from Last 3 Encounters:  11/28/21 171 lb 6.4 oz (77.7 kg)  08/23/21 171 lb 6.4 oz (77.7 kg)  07/16/21 168 lb 12.8 oz (76.6 kg)    Lab Results  Component Value Date   WBC 6.3 03/16/2020   HGB 12.8 03/16/2020   HCT 38.5 03/16/2020   PLT 302.0 03/16/2020   GLUCOSE 82 07/16/2021   CHOL 215 (H) 07/26/2020   TRIG 88 07/26/2020   HDL 62 07/26/2020   LDLCALC 134 (H) 07/26/2020   ALT 13 07/16/2021   AST 15 07/16/2021   NA 137 07/16/2021   K 4.1 07/16/2021   CL 103 07/16/2021   CREATININE 0.55 07/16/2021   BUN 9 07/16/2021   CO2 27 07/16/2021   TSH 0.36 07/16/2021   HGBA1C 5.4 03/16/2020    Assessment/Plan:  Other fatigue -Likely postviral.  Also consider vitamin deficiency or anemia - Plan: Vitamin D, 25-hydroxy, TSH, T4, Free, CBC with Differential/Platelet, Vitamin B12  Preauricular lymphadenopathy -Possibly 2/2 recent viral infection -Continue to monitor  Nodule of left palm -Calcification versus ganglion cyst -We will monitor for now -If develops pain or area enlarges, refer to hand surgery.  Left ear impacted cerumen -Firm cerumen noted -Consent obtained.  Left ear irrigated.  Patient tolerated procedure well.  Firm cerumen remained impacted. -Discussed using OTC Debrox eardrops  Rash  of face - Plan: metroNIDAZOLE (METROGEL) 1 % gel  Hyperthyroidism - Plan: TSH, T4, Free  F/u prn More than 50% of over 35 minutes spent in total in caring for this patient was spent face-to-face, reviewing the chart, counseling and/or coordinating care.    Grier Mitts, MD

## 2021-11-29 LAB — T4, FREE: Free T4: 0.9 ng/dL (ref 0.60–1.60)

## 2021-11-29 LAB — CBC WITH DIFFERENTIAL/PLATELET
Basophils Absolute: 0.1 10*3/uL (ref 0.0–0.1)
Basophils Relative: 1.4 % (ref 0.0–3.0)
Eosinophils Absolute: 0.2 10*3/uL (ref 0.0–0.7)
Eosinophils Relative: 2.8 % (ref 0.0–5.0)
HCT: 38.5 % (ref 36.0–46.0)
Hemoglobin: 12.3 g/dL (ref 12.0–15.0)
Lymphocytes Relative: 44.2 % (ref 12.0–46.0)
Lymphs Abs: 3.3 10*3/uL (ref 0.7–4.0)
MCHC: 32 g/dL (ref 30.0–36.0)
MCV: 85.7 fl (ref 78.0–100.0)
Monocytes Absolute: 0.6 10*3/uL (ref 0.1–1.0)
Monocytes Relative: 7.8 % (ref 3.0–12.0)
Neutro Abs: 3.3 10*3/uL (ref 1.4–7.7)
Neutrophils Relative %: 43.8 % (ref 43.0–77.0)
Platelets: 283 10*3/uL (ref 150.0–400.0)
RBC: 4.49 Mil/uL (ref 3.87–5.11)
RDW: 14.1 % (ref 11.5–15.5)
WBC: 7.5 10*3/uL (ref 4.0–10.5)

## 2021-11-29 LAB — TSH: TSH: 0.3 u[IU]/mL — ABNORMAL LOW (ref 0.35–5.50)

## 2021-11-29 LAB — VITAMIN B12: Vitamin B-12: 1153 pg/mL — ABNORMAL HIGH (ref 211–911)

## 2021-11-29 LAB — VITAMIN D 25 HYDROXY (VIT D DEFICIENCY, FRACTURES): VITD: 40.87 ng/mL (ref 30.00–100.00)

## 2022-04-21 ENCOUNTER — Ambulatory Visit
Admission: RE | Admit: 2022-04-21 | Discharge: 2022-04-21 | Disposition: A | Discharge status: Home / Self Care | Payer: Medicaid Other | Source: Ambulatory Visit | Attending: Family Medicine | Admitting: Family Medicine

## 2022-04-21 DIAGNOSIS — M5126 Other intervertebral disc displacement, lumbar region: Secondary | ICD-10-CM | POA: Diagnosis not present

## 2022-04-21 DIAGNOSIS — M48061 Spinal stenosis, lumbar region without neurogenic claudication: Secondary | ICD-10-CM | POA: Diagnosis not present

## 2022-04-21 DIAGNOSIS — D1800 Hemangioma unspecified site: Secondary | ICD-10-CM | POA: Diagnosis not present

## 2022-04-21 DIAGNOSIS — M5416 Radiculopathy, lumbar region: Secondary | ICD-10-CM

## 2022-04-21 DIAGNOSIS — Z8739 Personal history of other diseases of the musculoskeletal system and connective tissue: Secondary | ICD-10-CM

## 2022-05-16 ENCOUNTER — Inpatient Hospital Stay (HOSPITAL_COMMUNITY)
Admission: EM | Admit: 2022-05-16 | Discharge: 2022-05-23 | DRG: 330 | Disposition: A | Discharge status: Home / Self Care | Payer: Medicaid Other | Attending: Internal Medicine | Admitting: Internal Medicine

## 2022-05-16 ENCOUNTER — Other Ambulatory Visit: Payer: Self-pay

## 2022-05-16 ENCOUNTER — Encounter (HOSPITAL_COMMUNITY): Payer: Self-pay

## 2022-05-16 ENCOUNTER — Emergency Department (HOSPITAL_COMMUNITY): Payer: Medicaid Other

## 2022-05-16 DIAGNOSIS — R109 Unspecified abdominal pain: Secondary | ICD-10-CM | POA: Diagnosis not present

## 2022-05-16 DIAGNOSIS — E039 Hypothyroidism, unspecified: Secondary | ICD-10-CM | POA: Diagnosis present

## 2022-05-16 DIAGNOSIS — Z8349 Family history of other endocrine, nutritional and metabolic diseases: Secondary | ICD-10-CM

## 2022-05-16 DIAGNOSIS — Z8249 Family history of ischemic heart disease and other diseases of the circulatory system: Secondary | ICD-10-CM

## 2022-05-16 DIAGNOSIS — I951 Orthostatic hypotension: Secondary | ICD-10-CM | POA: Diagnosis present

## 2022-05-16 DIAGNOSIS — E538 Deficiency of other specified B group vitamins: Secondary | ICD-10-CM | POA: Diagnosis present

## 2022-05-16 DIAGNOSIS — E0590 Thyrotoxicosis, unspecified without thyrotoxic crisis or storm: Secondary | ICD-10-CM | POA: Diagnosis present

## 2022-05-16 DIAGNOSIS — E782 Mixed hyperlipidemia: Secondary | ICD-10-CM | POA: Diagnosis present

## 2022-05-16 DIAGNOSIS — K56600 Partial intestinal obstruction, unspecified as to cause: Secondary | ICD-10-CM | POA: Diagnosis not present

## 2022-05-16 DIAGNOSIS — F32A Depression, unspecified: Secondary | ICD-10-CM | POA: Diagnosis present

## 2022-05-16 DIAGNOSIS — C7A012 Malignant carcinoid tumor of the ileum: Secondary | ICD-10-CM | POA: Diagnosis present

## 2022-05-16 DIAGNOSIS — K6389 Other specified diseases of intestine: Secondary | ICD-10-CM

## 2022-05-16 DIAGNOSIS — E876 Hypokalemia: Secondary | ICD-10-CM

## 2022-05-16 DIAGNOSIS — R1110 Vomiting, unspecified: Secondary | ICD-10-CM | POA: Diagnosis not present

## 2022-05-16 DIAGNOSIS — R590 Localized enlarged lymph nodes: Secondary | ICD-10-CM | POA: Diagnosis present

## 2022-05-16 DIAGNOSIS — K56609 Unspecified intestinal obstruction, unspecified as to partial versus complete obstruction: Secondary | ICD-10-CM | POA: Diagnosis present

## 2022-05-16 DIAGNOSIS — K769 Liver disease, unspecified: Secondary | ICD-10-CM

## 2022-05-16 DIAGNOSIS — C7A011 Malignant carcinoid tumor of the jejunum: Principal | ICD-10-CM | POA: Diagnosis present

## 2022-05-16 DIAGNOSIS — F419 Anxiety disorder, unspecified: Secondary | ICD-10-CM | POA: Diagnosis present

## 2022-05-16 DIAGNOSIS — D649 Anemia, unspecified: Secondary | ICD-10-CM | POA: Diagnosis present

## 2022-05-16 DIAGNOSIS — R160 Hepatomegaly, not elsewhere classified: Secondary | ICD-10-CM | POA: Diagnosis not present

## 2022-05-16 DIAGNOSIS — R143 Flatulence: Secondary | ICD-10-CM | POA: Diagnosis not present

## 2022-05-16 DIAGNOSIS — F418 Other specified anxiety disorders: Secondary | ICD-10-CM

## 2022-05-16 DIAGNOSIS — R42 Dizziness and giddiness: Secondary | ICD-10-CM | POA: Diagnosis present

## 2022-05-16 DIAGNOSIS — E559 Vitamin D deficiency, unspecified: Secondary | ICD-10-CM | POA: Diagnosis present

## 2022-05-16 DIAGNOSIS — C7B02 Secondary carcinoid tumors of liver: Secondary | ICD-10-CM | POA: Diagnosis present

## 2022-05-16 DIAGNOSIS — K5669 Other partial intestinal obstruction: Secondary | ICD-10-CM | POA: Diagnosis present

## 2022-05-16 LAB — CBC WITH DIFFERENTIAL/PLATELET
Abs Immature Granulocytes: 0.02 10*3/uL (ref 0.00–0.07)
Basophils Absolute: 0 10*3/uL (ref 0.0–0.1)
Basophils Relative: 0 %
Eosinophils Absolute: 0.1 10*3/uL (ref 0.0–0.5)
Eosinophils Relative: 1 %
HCT: 42.3 % (ref 36.0–46.0)
Hemoglobin: 13.7 g/dL (ref 12.0–15.0)
Immature Granulocytes: 0 %
Lymphocytes Relative: 28 %
Lymphs Abs: 2.4 10*3/uL (ref 0.7–4.0)
MCH: 28 pg (ref 26.0–34.0)
MCHC: 32.4 g/dL (ref 30.0–36.0)
MCV: 86.3 fL (ref 80.0–100.0)
Monocytes Absolute: 0.5 10*3/uL (ref 0.1–1.0)
Monocytes Relative: 6 %
Neutro Abs: 5.7 10*3/uL (ref 1.7–7.7)
Neutrophils Relative %: 65 %
Platelets: 337 10*3/uL (ref 150–400)
RBC: 4.9 MIL/uL (ref 3.87–5.11)
RDW: 14.2 % (ref 11.5–15.5)
WBC: 8.6 10*3/uL (ref 4.0–10.5)
nRBC: 0 % (ref 0.0–0.2)

## 2022-05-16 LAB — COMPREHENSIVE METABOLIC PANEL
ALT: 15 U/L (ref 0–44)
AST: 21 U/L (ref 15–41)
Albumin: 4.1 g/dL (ref 3.5–5.0)
Alkaline Phosphatase: 78 U/L (ref 38–126)
Anion gap: 9 (ref 5–15)
BUN: 10 mg/dL (ref 6–20)
CO2: 25 mmol/L (ref 22–32)
Calcium: 9.2 mg/dL (ref 8.9–10.3)
Chloride: 107 mmol/L (ref 98–111)
Creatinine, Ser: 0.51 mg/dL (ref 0.44–1.00)
GFR, Estimated: >60 mL/min (ref >60)
Glucose, Bld: 120 mg/dL — ABNORMAL HIGH (ref 70–99)
Potassium: 4.1 mmol/L (ref 3.5–5.1)
Sodium: 141 mmol/L (ref 135–145)
Total Bilirubin: 0.7 mg/dL (ref 0.3–1.2)
Total Protein: 8.1 g/dL (ref 6.5–8.1)

## 2022-05-16 LAB — URINALYSIS, ROUTINE W REFLEX MICROSCOPIC
Bilirubin Urine: NEGATIVE
Glucose, UA: NEGATIVE mg/dL
Ketones, ur: NEGATIVE mg/dL
Nitrite: NEGATIVE
Protein, ur: NEGATIVE mg/dL
Specific Gravity, Urine: 1.02 (ref 1.005–1.030)
pH: 6 (ref 5.0–8.0)

## 2022-05-16 LAB — LIPASE, BLOOD: Lipase: 28 U/L (ref 11–51)

## 2022-05-16 LAB — PREGNANCY, URINE: Preg Test, Ur: NEGATIVE

## 2022-05-16 MED ORDER — SODIUM CHLORIDE 0.9 % IV SOLN: INTRAVENOUS | Status: DC

## 2022-05-16 MED ORDER — SODIUM CHLORIDE 0.9% FLUSH
3.0000 mL | Freq: Two times a day (BID) | INTRAVENOUS | Status: DC
Start: 2022-05-16 — End: 2022-05-23
  Administered 2022-05-18 – 2022-05-22 (×5): 3 mL via INTRAVENOUS

## 2022-05-16 MED ORDER — ONDANSETRON HCL 4 MG/2ML IJ SOLN
4.0000 mg | Freq: Once | INTRAMUSCULAR | Status: AC
  Administered 2022-05-16: 4 mg via INTRAVENOUS
  Filled 2022-05-16: qty 2

## 2022-05-16 MED ORDER — ACETAMINOPHEN 325 MG PO TABS
650.0000 mg | ORAL_TABLET | Freq: Four times a day (QID) | ORAL | Status: DC | PRN
  Administered 2022-05-17 (×2): 650 mg via ORAL
  Filled 2022-05-16 (×2): qty 2

## 2022-05-16 MED ORDER — ACETAMINOPHEN 650 MG RE SUPP: 650.0000 mg | Freq: Four times a day (QID) | RECTAL | Status: DC | PRN

## 2022-05-16 MED ORDER — ENOXAPARIN SODIUM 40 MG/0.4ML IJ SOSY
40.0000 mg | PREFILLED_SYRINGE | INTRAMUSCULAR | Status: DC
Start: 2022-05-16 — End: 2022-05-22
  Administered 2022-05-16 – 2022-05-21 (×6): 40 mg via SUBCUTANEOUS
  Filled 2022-05-16 (×6): qty 0.4

## 2022-05-16 MED ORDER — IOHEXOL 300 MG/ML  SOLN
100.0000 mL | Freq: Once | INTRAMUSCULAR | Status: AC | PRN
  Administered 2022-05-16: 100 mL via INTRAVENOUS

## 2022-05-16 MED ORDER — MORPHINE SULFATE (PF) 4 MG/ML IV SOLN
4.0000 mg | Freq: Once | INTRAVENOUS | Status: AC
  Administered 2022-05-16: 4 mg via INTRAVENOUS
  Filled 2022-05-16: qty 1

## 2022-05-16 MED ORDER — SODIUM CHLORIDE 0.9 % IV BOLUS
1000.0000 mL | Freq: Once | INTRAVENOUS | Status: AC
  Administered 2022-05-16: 1000 mL via INTRAVENOUS

## 2022-05-16 NOTE — H&P (Signed)
History and Physical   Linda Villanueva YCX:[id number] DOB: [birth year] DOA: 05/16/2022  PCP: Billie Ruddy, MD   Patient coming from: Home  Chief Complaint: Abdominal pain, nausea, vomiting, diarrhea  HPI: Linda Villanueva is a 51 y.o. female with medical history significant of hypothyroidism, hyperlipidemia presenting with abdominal pain, nausea, vomiting, dizziness and diarrhea.  Patient reports 3 days of abdominal pain worse today.  Associated with multiple episodes of emesis since episodes of diarrhea.  Also reporting some mild lightheadedness but no syncope.  Reports similar symptoms 4 years ago.  She denies fevers, chills, chest pain, shortness of breath, constipation.  ED Course: Vital signs in the ED stable.  Lab work included CMP with glucose 120.  CBC within normal limits.  Lipase normal.  Urinalysis with hemoglobin, leukocytes, bacteria.  CT of the abdomen pelvis showed multiple dilated loops of small bowel suspicious for SBO also enhancing mesenteric mass with liver masses suspicious for carcinoid tumor along with small volume free fluid and mild prominent CBD.  Patient received morphine, fluids, Zofran in the ED.  Review of Systems: As per HPI otherwise all other systems reviewed and are negative.  Past Medical History:  Diagnosis Date   Vitamin B12 deficiency    Vitamin D deficiency     Past Surgical History:  Procedure Laterality Date   INNER EAR SURGERY      Social History  reports that she has never smoked. She has never used smokeless tobacco. She reports that she does not drink alcohol and does not use drugs.  No Known Allergies  Family History  Problem Relation Age of Onset   Hypertension Mother    Hypertension Father    Thyroid disease Sister   Reviewed on admission  Prior to Admission medications   Medication Sig Start Date End Date Taking? Authorizing Provider  cycloSPORINE (RESTASIS) 0.05 % ophthalmic emulsion Place 1 drop into both eyes 2 (two)  times daily.    [provider]  metroNIDAZOLE (METROGEL) 1 % gel Apply thin layer to clean face once daily. 11/28/21   Billie Ruddy, MD    Physical Exam: Vitals:   05/16/22 2145 05/16/22 2200 05/16/22 2215 05/16/22 2230  BP:  139/73  128/70  Pulse: 62 82 81 66  Resp:  18  18  Temp:      TempSrc:      SpO2: 99% 100% 100% 95%  Weight:      Height:        Physical Exam Constitutional:      General: She is not in acute distress.    Appearance: Normal appearance.  HENT:     Head: Normocephalic and atraumatic.     Mouth/Throat:     Mouth: Mucous membranes are moist.     Pharynx: Oropharynx is clear.  Eyes:     Extraocular Movements: Extraocular movements intact.     Pupils: Pupils are equal, round, and reactive to light.  Cardiovascular:     Rate and Rhythm: Normal rate and regular rhythm.     Pulses: Normal pulses.     Heart sounds: Normal heart sounds.  Pulmonary:     Effort: Pulmonary effort is normal. No respiratory distress.     Breath sounds: Normal breath sounds.  Abdominal:     General: Bowel sounds are normal. There is no distension.     Palpations: Abdomen is soft.     Tenderness: There is no abdominal tenderness.  Musculoskeletal:        General:  No swelling or deformity.  Skin:    General: Skin is warm and dry.  Neurological:     General: No focal deficit present.     Mental Status: Mental status is at baseline.    Labs on Admission: I have personally reviewed following labs and imaging studies  CBC: Recent Labs  Lab 05/16/22 1654  WBC 8.6  NEUTROABS 5.7  HGB 13.7  HCT 42.3  MCV 86.3  PLT 924    Basic Metabolic Panel: Recent Labs  Lab 05/16/22 1654  NA 141  K 4.1  CL 107  CO2 25  GLUCOSE 120*  BUN 10  CREATININE 0.51  CALCIUM 9.2    GFR: Estimated Creatinine Clearance: 91.6 mL/min (by C-G formula based on SCr of 0.51 mg/dL).  Liver Function Tests: Recent Labs  Lab 05/16/22 1654  AST 21  ALT 15  ALKPHOS 78   BILITOT 0.7  PROT 8.1  ALBUMIN 4.1    Urine analysis:    Component Value Date/Time   COLORURINE YELLOW 05/16/2022 1638   APPEARANCEUR HAZY (A) 05/16/2022 1638   LABSPEC 1.020 05/16/2022 1638   PHURINE 6.0 05/16/2022 1638   GLUCOSEU NEGATIVE 05/16/2022 1638   HGBUR LARGE (A) 05/16/2022 1638   BILIRUBINUR NEGATIVE 05/16/2022 1638   KETONESUR NEGATIVE 05/16/2022 1638   PROTEINUR NEGATIVE 05/16/2022 1638   UROBILINOGEN 0.2 06/23/2018 1750   NITRITE NEGATIVE 05/16/2022 1638   LEUKOCYTESUR TRACE (A) 05/16/2022 1638    Radiological Exams on Admission: CT ABDOMEN PELVIS W CONTRAST  Result Date: 05/16/2022 CLINICAL DATA:  Upper abdominal pain with nausea vomiting EXAM: CT ABDOMEN AND PELVIS WITH CONTRAST TECHNIQUE: Multidetector CT imaging of the abdomen and pelvis was performed using the standard protocol following bolus administration of intravenous contrast. RADIATION DOSE REDUCTION: This exam was performed according to the departmental dose-optimization program which includes automated exposure control, adjustment of the mA and/or kV according to patient size and/or use of iterative reconstruction technique. CONTRAST:  137m OMNIPAQUE IOHEXOL 300 MG/ML  SOLN COMPARISON:  None Available. FINDINGS: Lower chest: Lung bases demonstrate no acute consolidation or effusion. Hepatobiliary: Multiple hyperenhancing liver masses are visualized. The largest is seen within the left hepatic lobe and measures approximately 4.7 cm and demonstrates central low density. No calcified gallstone. Mildly prominent common bile duct up to 7 mm. Pancreas: Unremarkable. No pancreatic ductal dilatation or surrounding inflammatory changes. Spleen: Normal in size without focal abnormality. Adrenals/Urinary Tract: Adrenal glands are unremarkable. Kidneys are normal, without renal calculi, focal lesion, or hydronephrosis. Bladder is unremarkable. Stomach/Bowel: The stomach is nonenlarged. Negative appendix. Fluid-filled  mildly dilated mid to distal small bowel with some fecalized segments but no well-defined transition point. Some mesenteric vascular congestion. No intramural air or bowel wall thickening. Negative appendix. Focal area of hyperenhancing small bowel, series 2, image 41, coronal series 4, image 35. Vascular/Lymphatic: Nonaneurysmal aorta.  No suspicious lymph nodes. Reproductive: Uterus and bilateral adnexa are unremarkable. Other: No free air. Small moderate volume free fluid. Avidly enhancing central mesenteric mass measuring 2.9 by 2 cm, series 2, image 47 Musculoskeletal: No acute or significant osseous findings. IMPRESSION: 1. Multiple mildly dilated fluid-filled loops of mid small bowel, suspicious for partial small bowel obstruction, poorly defined transition point. Some mesenteric vascular congestion but no intramural air or bowel wall thickening. 2. 2.9 cm enhancing central mesenteric mass. 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 Focal segment of hyperenhancing small bowel could be potential source/site of primary. 3. Small moderate  volume free fluid in the pelvis 4. Mildly prominent common bile duct which [place] be correlated with LFTs. Electronically Signed   By: Donavan Foil M.D.   On: 05/16/2022 19:45    EKG: Not performed in the emergency department  Assessment/Plan Principal Problem:   SBO (small bowel obstruction) (HCC) Active Problems:   Hyperthyroidism   Mixed hyperlipidemia   Mesenteric mass   SBO > Patient presenting with nausea vomiting abdominal pain for the past few days worse today.  Noted to have SBO on CT of the abdomen pelvis. > Lab work stable.  CT also noted possible carcinoid tumor as below. > General surgery's been consulted and will see the patient tomorrow - Monitor on telemetry - Appreciate general surgery recommendations, anticipate further imaging, no NG tube at this time. - Continue  with IV fluids - N.p.o. except sips with meds  Mesenteric mass > CT noted to have mesenteric mass with possible mets noted with liver masses.  Abnormal attenuation in portion of the bowel as well.  Read is concern for carcinoid tumor. - Continue supportive care - Will need oncology consult in the morning  Hypothyroidism - Hyperlipidemia - Not on any medication for this  DVT prophylaxis: Lovenox Code Status:   Full Family Communication:  None on admission.  She states her family is aware she is here.  They are coming back later and [place] need update per patient.  Disposition Plan:   Patient is from:  Home  Anticipated DC to:  Home  Anticipated DC date:  1 to 3 days  Anticipated DC barriers: None  Consults called:  General surgery, consulted in the ED.  Will see the patient. Admission status:  Observation, telemetry  Severity of Illness: The appropriate patient status for this patient is OBSERVATION. Observation status is judged to be reasonable and necessary in order to provide the required intensity of service to ensure the patient's safety. The patient's presenting symptoms, physical exam findings, and initial radiographic and laboratory data in the context of their medical condition is felt to place them at decreased risk for further clinical deterioration. Furthermore, it is anticipated that the patient will be medically stable for discharge from the hospital within 2 midnights of admission.    Marcelyn Bruins MD [facility]  How to contact the [facility] Attending or Consulting provider [place] or covering provider during after hours [place], for this patient?   Check the care team in [facility] and look for a) attending/consulting [facility] provider listed and b) the [facility] team listed Log into [email or web address] and use [place] universal password to access. If you do not have the password, please contact the hospital operator. Locate the [facility] provider you are looking for under [facility] and page to a number that you can be directly reached. If you still have difficulty reaching the provider, please page the [facility] (Director on Call) for the Hospitalists listed on amion for assistance.  05/16/2022, 10:32 PM

## 2022-05-16 NOTE — ED Triage Notes (Signed)
Patient c/o upper abdominal pain since yesterday.  Today, the patient c/o dizziness, and N/V x 3 and diarrhea x 2 this AM.

## 2022-05-16 NOTE — ED Notes (Signed)
Called for consult to [place].

## 2022-05-16 NOTE — Consult Note (Signed)
Reason for Consult:ab pain, abnl ct Referring Physician: Dr Maryan Rued  Linda Villanueva is an 51 y.o. female.  HPI: 36 yof with history of appendectomy in [place] who presents with several days ab pain that got worse today and became intolerable.  She has had n/v and several episodes of diarrhea today.  No brbpr. No urinary symptoms. States she had similar symptoms several years ago with a negative workup.  She has been stressed lately due to events in her country and remaining family who are there.  She was comfortable with [place]. She had a ct scan that shows a likely partial sbo possibly related mass in mesentery and/or small bowel. Liver lesions are present also. No other symptoms  Past Medical History:  Diagnosis Date   Vitamin B12 deficiency    Vitamin D deficiency     Past Surgical History:  Procedure Laterality Date   INNER EAR SURGERY      Family History  Problem Relation Age of Onset   Hypertension Mother    Hypertension Father    Thyroid disease Sister     Social History:  reports that she has never smoked. She has never used smokeless tobacco. She reports that she does not drink alcohol and does not use drugs.  Allergies: No Known Allergies  Medications:  No current facility-administered medications on file prior to encounter.   Current Outpatient Medications on File Prior to Encounter  Medication Sig Dispense Refill   cycloSPORINE (RESTASIS) 0.05 % ophthalmic emulsion Place 1 drop into both eyes 2 (two) times daily. (Patient not taking: Reported on 05/16/2022)     metroNIDAZOLE (METROGEL) 1 % gel Apply thin layer to clean face once daily. (Patient not taking: Reported on 05/16/2022) 45 g 0     Results for orders placed or performed during the hospital encounter of 05/16/22 (from the past 48 hour(s))  Urinalysis, Routine w reflex microscopic Urine, Clean Catch     Status: Abnormal   Collection Time: 05/16/22  4:38 PM  Result Value Ref Range   Color, Urine YELLOW  YELLOW   APPearance HAZY (A) CLEAR   Specific Gravity, Urine 1.020 1.005 - 1.030   pH 6.0 5.0 - 8.0   Glucose, UA NEGATIVE NEGATIVE mg/dL   Hgb urine dipstick LARGE (A) NEGATIVE   Bilirubin Urine NEGATIVE NEGATIVE   Ketones, ur NEGATIVE NEGATIVE mg/dL   Protein, ur NEGATIVE NEGATIVE mg/dL   Nitrite NEGATIVE NEGATIVE   Leukocytes,Ua TRACE (A) NEGATIVE   RBC / HPF 6-10 0 - 5 RBC/hpf   WBC, UA 6-10 0 - 5 WBC/hpf   Bacteria, UA MANY (A) NONE SEEN   Squamous Epithelial / LPF 0-5 0 - 5   Mucus PRESENT     Comment: Performed at [facility], [place] [address], [place], [place] [address]  Pregnancy, urine     Status: None   Collection Time: 05/16/22  4:38 PM  Result Value Ref Range   Preg Test, Ur NEGATIVE NEGATIVE    Comment:        THE SENSITIVITY OF THIS METHODOLOGY IS >20 mIU/mL. Performed at [facility], [place] [address] [place], [place] [address]   Lipase, blood     Status: None   Collection Time: 05/16/22  4:54 PM  Result Value Ref Range   Lipase 28 11 - 51 U/L    Comment: Performed at [facility], [place] [address], [place], [place] [address]  Comprehensive metabolic panel     Status:  Abnormal   Collection Time: 05/16/22  4:54 PM  Result Value Ref Range   Sodium 141 135 - 145 mmol/L   Potassium 4.1 3.5 - 5.1 mmol/L   Chloride 107 98 - 111 mmol/L   CO2 25 22 - 32 mmol/L   Glucose, Bld 120 (H) 70 - 99 mg/dL    Comment: Glucose reference range applies only to samples taken after fasting for at least 8 hours.   BUN 10 6 - 20 mg/dL   Creatinine, Ser 0.51 0.44 - 1.00 mg/dL   Calcium 9.2 8.9 - 10.3 mg/dL   Total Protein 8.1 6.5 - 8.1 g/dL   Albumin 4.1 3.5 - 5.0 g/dL   AST 21 15 - 41 U/L   ALT 15 0 - 44 U/L   Alkaline Phosphatase 78 38 - 126 U/L   Total Bilirubin 0.7 0.3 - 1.2 mg/dL   GFR, Estimated >60 >60 mL/min    Comment: (NOTE) Calculated using the CKD-EPI Creatinine Equation (2021)    Anion gap 9 5 - 15     Comment: Performed at [facility], [place] [address] [place], [place] [address]  CBC with Differential     Status: None   Collection Time: 05/16/22  4:54 PM  Result Value Ref Range   WBC 8.6 4.0 - 10.5 K/uL   RBC 4.90 3.87 - 5.11 MIL/uL   Hemoglobin 13.7 12.0 - 15.0 g/dL   HCT 42.3 36.0 - 46.0 %   MCV 86.3 80.0 - 100.0 fL   MCH 28.0 26.0 - 34.0 pg   MCHC 32.4 30.0 - 36.0 g/dL   RDW 14.2 11.5 - 15.5 %   Platelets 337 150 - 400 K/uL   nRBC 0.0 0.0 - 0.2 %   Neutrophils Relative % 65 %   Neutro Abs 5.7 1.7 - 7.7 K/uL   Lymphocytes Relative 28 %   Lymphs Abs 2.4 0.7 - 4.0 K/uL   Monocytes Relative 6 %   Monocytes Absolute 0.5 0.1 - 1.0 K/uL   Eosinophils Relative 1 %   Eosinophils Absolute 0.1 0.0 - 0.5 K/uL   Basophils Relative 0 %   Basophils Absolute 0.0 0.0 - 0.1 K/uL   Immature Granulocytes 0 %   Abs Immature Granulocytes 0.02 0.00 - 0.07 K/uL    Comment: Performed at [facility], [place] [address], [place], [place] [address]    CT ABDOMEN PELVIS W CONTRAST  Result Date: 05/16/2022 CLINICAL DATA:  Upper abdominal pain with nausea vomiting EXAM: CT ABDOMEN AND PELVIS WITH CONTRAST TECHNIQUE: Multidetector CT imaging of the abdomen and pelvis was performed using the standard protocol following bolus administration of intravenous contrast. RADIATION DOSE REDUCTION: This exam was performed according to the departmental dose-optimization program which includes automated exposure control, adjustment of the mA and/or kV according to patient size and/or use of iterative reconstruction technique. CONTRAST:  164m OMNIPAQUE IOHEXOL 300 MG/ML  SOLN COMPARISON:  None Available. FINDINGS: Lower chest: Lung bases demonstrate no acute consolidation or effusion. Hepatobiliary: Multiple hyperenhancing liver masses are visualized. The largest is seen within the left hepatic lobe and measures approximately 4.7 cm and demonstrates central low density. No calcified  gallstone. Mildly prominent common bile duct up to 7 mm. Pancreas: Unremarkable. No pancreatic ductal dilatation or surrounding inflammatory changes. Spleen: Normal in size without focal abnormality. Adrenals/Urinary Tract: Adrenal glands are unremarkable. Kidneys are normal, without renal calculi, focal lesion, or hydronephrosis. Bladder is unremarkable. Stomach/Bowel: The stomach is nonenlarged. Negative appendix. Fluid-filled mildly  dilated mid to distal small bowel with some fecalized segments but no well-defined transition point. Some mesenteric vascular congestion. No intramural air or bowel wall thickening. Negative appendix. Focal area of hyperenhancing small bowel, series 2, image 41, coronal series 4, image 35. Vascular/Lymphatic: Nonaneurysmal aorta.  No suspicious lymph nodes. Reproductive: Uterus and bilateral adnexa are unremarkable. Other: No free air. Small moderate volume free fluid. Avidly enhancing central mesenteric mass measuring 2.9 by 2 cm, series 2, image 47 Musculoskeletal: No acute or significant osseous findings. IMPRESSION: 1. Multiple mildly dilated fluid-filled loops of mid small bowel, suspicious for partial small bowel obstruction, poorly defined transition point. Some mesenteric vascular congestion but no intramural air or bowel wall thickening. 2. 2.9 cm enhancing central mesenteric mass. 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 Focal segment of hyperenhancing small bowel could be potential source/site of primary. 3. Small moderate volume free fluid in the pelvis 4. Mildly prominent common bile duct which Renea be correlated with LFTs. Electronically Signed   By: Donavan Foil M.D.   On: 05/16/2022 19:45    Review of Systems  Gastrointestinal:  Positive for abdominal pain, diarrhea, nausea and vomiting.  All other systems reviewed and are negative.  Blood pressure 139/73, pulse 81, temperature 98  F (36.7 C), temperature source Oral, resp. rate 18, height '5\' 7"'$  (1.702 m), weight 78 kg, last menstrual period 04/27/2022, SpO2 100 %. Physical Exam Vitals reviewed.  Constitutional:      General: She is not in acute distress.    Appearance: She is well-developed.  Eyes:     General: No scleral icterus.    Extraocular Movements: Extraocular movements intact.  Cardiovascular:     Rate and Rhythm: Normal rate and regular rhythm.  Pulmonary:     Effort: Pulmonary effort is normal.  Abdominal:     General: A surgical scar is present. There is distension (mild).     Palpations: Abdomen is soft.     Tenderness: There is generalized abdominal tenderness (mild).     Hernia: No hernia is present.    Skin:    General: Skin is warm and dry.     Capillary Refill: Capillary refill takes less than 2 seconds.     Coloration: Skin is not jaundiced.  Neurological:     General: No focal deficit present.     Mental Status: She is alert.  Psychiatric:        Mood and Affect: Mood normal.        Behavior: Behavior normal.     Assessment/Plan: pSBO, small bowel/mesenteric mass, liver lesions -she does not need anything urgent surgically right now -she can have liquids until midnight as discussed with ER PA then npo after midnight -will likely need laparoscopy and possible small bowel resection for treatment of symptoms as well as diagnosis -pharm dvt prophylaxis is fine -no need for abx -will check xray in am and follow up then  I reviewed ED provider notes, last 24 h vitals and pain scores, last 24 h labs and trends, and last 24 h imaging results.I discussed case with ER provider.  I reviewed all labs and independently reviewed the ct scan and concur.   This care required moderate level of medical decision making.   Rolm Bookbinder 05/16/2022, 10:26 PM

## 2022-05-16 NOTE — ED Provider Notes (Signed)
[facility] DEPT Provider Note   CSN: [id number] Arrival date & time: 05/16/22  1553     History  Chief Complaint  Patient presents with   Abdominal Pain   Emesis   Dizziness    Linda Villanueva is a 51 y.o. female.  51 year old female presents today for evaluation of 3-day duration of abdominal pain that worsened today.  She reports multiple episodes of emesis, couple episodes of diarrhea.  She also endorses feeling slightly lightheaded today.  Denies any syncopal episodes.  Denies hematemesis.  Denies blood in her stools.  States she has history of similar symptoms 4 years ago.  Denies any chest pain, shortness of breath, palpitations.  Pain is primarily in the upper abdomen.  The history is provided by the patient. No language interpreter was used.       Home Medications Prior to Admission medications   Medication Sig Start Date End Date Taking? Authorizing Provider  cycloSPORINE (RESTASIS) 0.05 % ophthalmic emulsion Place 1 drop into both eyes 2 (two) times daily.    [provider]  metroNIDAZOLE (METROGEL) 1 % gel Apply thin layer to clean face once daily. 11/28/21   Billie Ruddy, MD      Allergies    Patient has no known allergies.    Review of Systems   Review of Systems  Constitutional:  Negative for chills and fever.  Respiratory:  Negative for shortness of breath.   Cardiovascular:  Negative for chest pain and palpitations.  Gastrointestinal:  Positive for abdominal pain, diarrhea, nausea and vomiting. Negative for blood in stool.  Genitourinary:  Negative for difficulty urinating and dysuria.  Neurological:  Positive for light-headedness. Negative for syncope.  All other systems reviewed and are negative.   Physical Exam Updated Vital Signs BP (!) 130/91 (BP Location: Left Arm)   Pulse 79   Temp 98 F (36.7 C) (Oral)   Resp 18   Ht '5\' 7"'$  (1.702 m)   Wt 78 kg   LMP 04/27/2022   SpO2 93%   BMI 26.94 kg/m   Physical Exam Vitals and nursing note reviewed.  Constitutional:      General: She is not in acute distress.    Appearance: Normal appearance. She is not ill-appearing.  HENT:     Head: Normocephalic and atraumatic.     Nose: Nose normal.  Eyes:     General: No scleral icterus.    Extraocular Movements: Extraocular movements intact.     Conjunctiva/sclera: Conjunctivae normal.  Cardiovascular:     Rate and Rhythm: Normal rate and regular rhythm.     Pulses: Normal pulses.  Pulmonary:     Effort: Pulmonary effort is normal. No respiratory distress.     Breath sounds: Normal breath sounds. No wheezing or rales.  Abdominal:     General: There is no distension.     Palpations: Abdomen is soft.     Tenderness: There is abdominal tenderness (Right upper quadrant, epigastric region). There is no right CVA tenderness, left CVA tenderness or guarding.  Musculoskeletal:        General: Normal range of motion.     Cervical back: Normal range of motion.  Skin:    General: Skin is warm and dry.  Neurological:     General: No focal deficit present.     Mental Status: She is alert. Mental status is at baseline.     ED Results / Procedures / Treatments   Labs (all labs ordered are  listed, but only abnormal results are displayed) Labs Reviewed  LIPASE, BLOOD  COMPREHENSIVE METABOLIC PANEL  URINALYSIS, ROUTINE W REFLEX MICROSCOPIC  CBC WITH DIFFERENTIAL/PLATELET  I-STAT BETA HCG BLOOD, ED ([facility], [facility], [facility] ONLY)    EKG None  Radiology No results found.  Procedures Procedures    Medications Ordered in ED Medications  sodium chloride 0.9 % bolus 1,000 mL (has no administration in time range)  morphine (PF) 4 MG/ML injection 4 mg (has no administration in time range)  ondansetron (ZOFRAN) injection 4 mg (has no administration in time range)    ED Course/ Medical Decision Making/ A&P                           Medical Decision Making Amount and/or Complexity of Data  Reviewed Labs: ordered. Radiology: ordered.  Risk Prescription drug management. Decision regarding hospitalization.   Medical Decision Making / ED Course   This patient presents to the ED for concern of abdominal pain, nausea, vomiting, this involves an extensive number of treatment options, and is a complaint that carries with it a high risk of complications and morbidity.  The differential diagnosis includes gastroenteritis, pancreatitis, appendicitis, bowel obstruction, malignancy  MDM: 51 year old female presents today for evaluation of 3-day duration of abdominal pain that worsened today.  Associated with emesis, and diarrhea.  Will obtain labs, CT abdomen pelvis, provide pain relief, and IV hydration. Patient reports significant improvement in symptoms following pain medication and IV hydration.  CT imaging shows concern for partial bowel obstruction.  There is also multiple hyperenhancing liver masses suspicious for carcinoid tumor.  There is concerned that this could be metastatic from colon cancer.  Will discuss with gastroenterology.  Gastrology states that this is not a primary gastroenterology concern and would recommend general surgery consult regarding the lesion and concern for malignancy.  Discussed with general surgery who recommends medicine admission and they will evaluate patient in the morning.  Patient currently is symptomatically managed.  Discussed with hospitalist will evaluate patient for admission.   Lab Tests: -I ordered, reviewed, and interpreted labs.   The pertinent results include:   Labs Reviewed  COMPREHENSIVE METABOLIC PANEL - Abnormal; Notable for the following components:      Result Value   Glucose, Bld 120 (*)    All other components within normal limits  URINALYSIS, ROUTINE W REFLEX MICROSCOPIC - Abnormal; Notable for the following components:   APPearance HAZY (*)    Hgb urine dipstick LARGE (*)    Leukocytes,Ua TRACE (*)    Bacteria, UA MANY  (*)    All other components within normal limits  LIPASE, BLOOD  CBC WITH DIFFERENTIAL/PLATELET  PREGNANCY, URINE      EKG  EKG Interpretation  Date/Time:    Ventricular Rate:    PR Interval:    QRS Duration:   QT Interval:    QTC Calculation:   R Axis:     Text Interpretation:           Imaging Studies ordered: I ordered imaging studies including CT abdomen pelvis with contrast I independently visualized and interpreted imaging. I agree with the radiologist interpretation   Medicines ordered and prescription drug management: Meds ordered this encounter  Medications   sodium chloride 0.9 % bolus 1,000 mL   morphine (PF) 4 MG/ML injection 4 mg   ondansetron (ZOFRAN) injection 4 mg   iohexol (OMNIPAQUE) 300 MG/ML solution 100 mL    -I have reviewed the  patients home medicines and have made adjustments as needed  Reevaluation: After the interventions noted above, I reevaluated the patient and found that they have :improved  Co morbidities that complicate the patient evaluation  Past Medical History:  Diagnosis Date   Vitamin B12 deficiency    Vitamin D deficiency       Dispostion: Patient discussed with hospitalist who will evaluate patient for admission.   Final Clinical Impression(s) / ED Diagnoses Final diagnoses:  Partial small bowel obstruction [facility])  Liver lesion    Rx / DC Orders ED Discharge Orders     None         Evlyn Courier, PA-C 05/16/22 2158    Blanchie Dessert, MD 05/16/22 2337

## 2022-05-17 ENCOUNTER — Inpatient Hospital Stay (HOSPITAL_COMMUNITY): Payer: Medicaid Other

## 2022-05-17 DIAGNOSIS — K56609 Unspecified intestinal obstruction, unspecified as to partial versus complete obstruction: Secondary | ICD-10-CM | POA: Diagnosis not present

## 2022-05-17 DIAGNOSIS — K6389 Other specified diseases of intestine: Secondary | ICD-10-CM | POA: Diagnosis not present

## 2022-05-17 DIAGNOSIS — R42 Dizziness and giddiness: Secondary | ICD-10-CM | POA: Diagnosis not present

## 2022-05-17 DIAGNOSIS — R911 Solitary pulmonary nodule: Secondary | ICD-10-CM | POA: Diagnosis not present

## 2022-05-17 DIAGNOSIS — R109 Unspecified abdominal pain: Secondary | ICD-10-CM | POA: Diagnosis not present

## 2022-05-17 DIAGNOSIS — F32A Depression, unspecified: Secondary | ICD-10-CM | POA: Diagnosis not present

## 2022-05-17 DIAGNOSIS — E0590 Thyrotoxicosis, unspecified without thyrotoxic crisis or storm: Secondary | ICD-10-CM | POA: Diagnosis not present

## 2022-05-17 DIAGNOSIS — R590 Localized enlarged lymph nodes: Secondary | ICD-10-CM | POA: Diagnosis not present

## 2022-05-17 DIAGNOSIS — K56600 Partial intestinal obstruction, unspecified as to cause: Secondary | ICD-10-CM | POA: Diagnosis not present

## 2022-05-17 DIAGNOSIS — K5669 Other partial intestinal obstruction: Secondary | ICD-10-CM | POA: Diagnosis not present

## 2022-05-17 DIAGNOSIS — K769 Liver disease, unspecified: Secondary | ICD-10-CM

## 2022-05-17 DIAGNOSIS — C7B02 Secondary carcinoid tumors of liver: Secondary | ICD-10-CM | POA: Diagnosis not present

## 2022-05-17 DIAGNOSIS — R160 Hepatomegaly, not elsewhere classified: Secondary | ICD-10-CM | POA: Diagnosis not present

## 2022-05-17 DIAGNOSIS — E876 Hypokalemia: Secondary | ICD-10-CM | POA: Diagnosis not present

## 2022-05-17 DIAGNOSIS — I951 Orthostatic hypotension: Secondary | ICD-10-CM | POA: Diagnosis not present

## 2022-05-17 DIAGNOSIS — E039 Hypothyroidism, unspecified: Secondary | ICD-10-CM | POA: Diagnosis not present

## 2022-05-17 DIAGNOSIS — E538 Deficiency of other specified B group vitamins: Secondary | ICD-10-CM | POA: Diagnosis not present

## 2022-05-17 DIAGNOSIS — C189 Malignant neoplasm of colon, unspecified: Secondary | ICD-10-CM | POA: Diagnosis not present

## 2022-05-17 DIAGNOSIS — D649 Anemia, unspecified: Secondary | ICD-10-CM | POA: Diagnosis not present

## 2022-05-17 DIAGNOSIS — R143 Flatulence: Secondary | ICD-10-CM | POA: Diagnosis not present

## 2022-05-17 DIAGNOSIS — C7A011 Malignant carcinoid tumor of the jejunum: Secondary | ICD-10-CM | POA: Diagnosis not present

## 2022-05-17 DIAGNOSIS — C7A012 Malignant carcinoid tumor of the ileum: Secondary | ICD-10-CM | POA: Diagnosis not present

## 2022-05-17 DIAGNOSIS — R1110 Vomiting, unspecified: Secondary | ICD-10-CM | POA: Diagnosis not present

## 2022-05-17 DIAGNOSIS — Z8249 Family history of ischemic heart disease and other diseases of the circulatory system: Secondary | ICD-10-CM | POA: Diagnosis not present

## 2022-05-17 DIAGNOSIS — Z8349 Family history of other endocrine, nutritional and metabolic diseases: Secondary | ICD-10-CM | POA: Diagnosis not present

## 2022-05-17 DIAGNOSIS — E559 Vitamin D deficiency, unspecified: Secondary | ICD-10-CM | POA: Diagnosis not present

## 2022-05-17 DIAGNOSIS — F419 Anxiety disorder, unspecified: Secondary | ICD-10-CM | POA: Diagnosis not present

## 2022-05-17 DIAGNOSIS — E782 Mixed hyperlipidemia: Secondary | ICD-10-CM | POA: Diagnosis not present

## 2022-05-17 LAB — CBC
HCT: 37.3 % (ref 36.0–46.0)
Hemoglobin: 12.1 g/dL (ref 12.0–15.0)
MCH: 28.6 pg (ref 26.0–34.0)
MCHC: 32.4 g/dL (ref 30.0–36.0)
MCV: 88.2 fL (ref 80.0–100.0)
Platelets: 283 10*3/uL (ref 150–400)
RBC: 4.23 MIL/uL (ref 3.87–5.11)
RDW: 14.4 % (ref 11.5–15.5)
WBC: 8.7 10*3/uL (ref 4.0–10.5)
nRBC: 0 % (ref 0.0–0.2)

## 2022-05-17 LAB — COMPREHENSIVE METABOLIC PANEL
ALT: 14 U/L (ref 0–44)
AST: 16 U/L (ref 15–41)
Albumin: 3.2 g/dL — ABNORMAL LOW (ref 3.5–5.0)
Alkaline Phosphatase: 64 U/L (ref 38–126)
Anion gap: 3 — ABNORMAL LOW (ref 5–15)
BUN: 6 mg/dL (ref 6–20)
CO2: 25 mmol/L (ref 22–32)
Calcium: 8.2 mg/dL — ABNORMAL LOW (ref 8.9–10.3)
Chloride: 114 mmol/L — ABNORMAL HIGH (ref 98–111)
Creatinine, Ser: 0.61 mg/dL (ref 0.44–1.00)
GFR, Estimated: >60 mL/min (ref >60)
Glucose, Bld: 101 mg/dL — ABNORMAL HIGH (ref 70–99)
Potassium: 3.5 mmol/L (ref 3.5–5.1)
Sodium: 142 mmol/L (ref 135–145)
Total Bilirubin: 0.7 mg/dL (ref 0.3–1.2)
Total Protein: 6.4 g/dL — ABNORMAL LOW (ref 6.5–8.1)

## 2022-05-17 LAB — HIV ANTIBODY (ROUTINE TESTING W REFLEX): HIV Screen 4th Generation wRfx: NONREACTIVE

## 2022-05-17 NOTE — ED Notes (Signed)
Pt moved from bed 7 to bed 17 at this time, NAD noted, even RR and unlabored, call bell within reach for assistance, introduce self to pt and family member, side rails up x2 for safety, pt voiced no concerns or questions at this time, care on going, will continue to monitor.

## 2022-05-17 NOTE — Progress Notes (Signed)
Subjective/Chief Complaint: Pt denies n/v.  Has not had flatus.     Objective: Vital signs in last 24 hours: Temp:  [98.1 F (36.7 C)-98.4 F (36.9 C)] 98.1 F (36.7 C) (06/30 1328) Pulse Rate:  [56-87] 64 (06/30 1328) Resp:  [14-23] 18 (06/30 1328) BP: (99-154)/(58-92) 136/77 (06/30 1328) SpO2:  [95 %-100 %] 100 % (06/30 1328) Last BM Date : 05/16/22  Intake/Output from previous day: No intake/output data recorded. Intake/Output this shift: Total I/O In: 1114.2 [P.O.:480; I.V.:634.2] Out: 0   General appearance: alert and no distress Resp: breathing comfortably GI: soft, mildly distended, non tender.    Lab Results:  Recent Labs    05/16/22 1654 05/17/22 0514  WBC 8.6 8.7  HGB 13.7 12.1  HCT 42.3 37.3  PLT 337 283   BMET Recent Labs    05/16/22 1654 05/17/22 0514  NA 141 142  K 4.1 3.5  CL 107 114*  CO2 25 25  GLUCOSE 120* 101*  BUN 10 6  CREATININE 0.51 0.61  CALCIUM 9.2 8.2*   PT/INR No results for input(s): "LABPROT", "INR" in the last 72 hours. ABG No results for input(s): "PHART", "HCO3" in the last 72 hours.  Invalid input(s): "PCO2", "PO2"  Studies/Results: CT CHEST WO CONTRAST  Result Date: 05/17/2022 CLINICAL DATA:  Colon cancer, metastatic, staging EXAM: CT CHEST WITHOUT CONTRAST TECHNIQUE: Multidetector CT imaging of the chest was performed following the standard protocol without IV contrast. RADIATION DOSE REDUCTION: This exam was performed according to the departmental dose-optimization program which includes automated exposure control, adjustment of the mA and/or kV according to patient size and/or use of iterative reconstruction technique. COMPARISON:  None Available. FINDINGS: Cardiovascular: Normal cardiac size. No pericardial disease. Normal size main and branch pulmonary arteries. The thoracic aorta is unremarkable. Mediastinum/Nodes: No lymphadenopathy. The thyroid is unremarkable. The esophagus is unremarkable. Lungs/Pleura:  There is a solid 3 mm right upper lobe pulmonary nodule (series 4, image 22). No focal airspace disease. No pleural effusion. No pneumothorax. Upper Abdomen: Multiple liver masses best seen on contrast enhanced CT of the abdomen pelvis acquired yesterday. Musculoskeletal: No acute osseous abnormality. No suspicious osseous lesion. IMPRESSION: Indeterminate solid 3 mm right upper lobe pulmonary nodule. Multiple liver masses best seen on recent contrast enhanced CT of the abdomen and pelvis. Electronically Signed   By: Maurine Simmering M.D.   On: 05/17/2022 08:15   CT ABDOMEN PELVIS W CONTRAST  Result Date: 05/16/2022 CLINICAL DATA:  Upper abdominal pain with nausea vomiting EXAM: CT ABDOMEN AND PELVIS WITH CONTRAST TECHNIQUE: Multidetector CT imaging of the abdomen and pelvis was performed using the standard protocol following bolus administration of intravenous contrast. RADIATION DOSE REDUCTION: This exam was performed according to the departmental dose-optimization program which includes automated exposure control, adjustment of the mA and/or kV according to patient size and/or use of iterative reconstruction technique. CONTRAST:  121m OMNIPAQUE IOHEXOL 300 MG/ML  SOLN COMPARISON:  None Available. FINDINGS: Lower chest: Lung bases demonstrate no acute consolidation or effusion. Hepatobiliary: Multiple hyperenhancing liver masses are visualized. The largest is seen within the left hepatic lobe and measures approximately 4.7 cm and demonstrates central low density. No calcified gallstone. Mildly prominent common bile duct up to 7 mm. Pancreas: Unremarkable. No pancreatic ductal dilatation or surrounding inflammatory changes. Spleen: Normal in size without focal abnormality. Adrenals/Urinary Tract: Adrenal glands are unremarkable. Kidneys are normal, without renal calculi, focal lesion, or hydronephrosis. Bladder is unremarkable. Stomach/Bowel: The stomach is nonenlarged. Negative appendix. Fluid-filled mildly  dilated mid to distal small bowel with some fecalized segments but no well-defined transition point. Some mesenteric vascular congestion. No intramural air or bowel wall thickening. Negative appendix. Focal area of hyperenhancing small bowel, series 2, image 41, coronal series 4, image 35. Vascular/Lymphatic: Nonaneurysmal aorta.  No suspicious lymph nodes. Reproductive: Uterus and bilateral adnexa are unremarkable. Other: No free air. Small moderate volume free fluid. Avidly enhancing central mesenteric mass measuring 2.9 by 2 cm, series 2, image 47 Musculoskeletal: No acute or significant osseous findings. IMPRESSION: 1. Multiple mildly dilated fluid-filled loops of mid small bowel, suspicious for partial small bowel obstruction, poorly defined transition point. Some mesenteric vascular congestion but no intramural air or bowel wall thickening. 2. 2.9 cm enhancing central mesenteric mass. 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 Focal segment of hyperenhancing small bowel could be potential source/site of primary. 3. Small moderate volume free fluid in the pelvis 4. Mildly prominent common bile duct which Linda Villanueva be correlated with LFTs. Electronically Signed   By: Donavan Foil M.D.   On: 05/16/2022 19:45    Anti-infectives: Anti-infectives (From admission, onward)    None       Assessment/Plan: s/p * No surgery found *   P SBO Sb and mesenteric mass Liver masses. Suspicious for carcinoid. Will need surgical intervention given that she has partial obstruction.      LOS: 0 days    Stark Klein 05/17/2022

## 2022-05-17 NOTE — TOC Progression Note (Signed)
Transition of Care [facility]) - Progression Note    Patient Details  Name: Linda Villanueva MRN: [id number] Date of Birth: 11/08/1971  Transition of Care [facility]) CM/SW Contact  Purcell Mouton, RN Phone Number: 05/17/2022, 3:36 PM  Clinical Narrative:      Transition of Care [facility]) Screening Note   Patient Details  Name: Linda Villanueva Date of Birth: 1971/10/20   Transition of Care [facility]) CM/SW Contact:    Purcell Mouton, RN Phone Number: 05/17/2022, 3:36 PM    Transition of Care Department (TOC) has reviewed patient and no TOC needs have been identified at this time. We will continue to monitor patient advancement through interdisciplinary progression rounds. If new patient transition needs arise, please place a TOC consult.         Expected Discharge Plan and Services                                                 Social Determinants of Health (SDOH) Interventions    Readmission Risk Interventions     No data to display

## 2022-05-17 NOTE — ED Notes (Signed)
Pt continues to sleep, even RR and unlabored, NAD noted, call bell in reach, side rails up x2 for safety, care on going, will continue to monitor.

## 2022-05-17 NOTE — ED Notes (Signed)
Pt appears to be sleeping, even RR and unlabored, NAD noted, call bell in reach, side rails up x2 for safety, care on going, will continue to monitor. 

## 2022-05-17 NOTE — ED Notes (Signed)
Pt ambulated to BR, steady gait noted 

## 2022-05-17 NOTE — Progress Notes (Signed)
[facility] PROGRESS NOTE    Progress Note  Linda Villanueva  OIN:[id number] DOB: 09-Jul-1971 DOA: 05/16/2022 PCP: Billie Ruddy, MD     Brief Narrative:   Linda Villanueva is an 51 y.o. female past medical history significant for hypothyroidism hyperlipidemia comes in with abdominal pain nausea vomiting and diarrhea that started 3 days prior to admission.  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    Assessment/Plan:   SBO (small bowel obstruction) (HCC) CT of the abdomen and pelvis 3 cm mesenteric mass and multiple enhancing liver masses concerning for metastatic disease with focal segmental hyperenhancing small bowel. General surgery was consulted who recommended laparoscopic intervention first resection of small bowel and diagnosis. Abdominal x-ray pending this morning. Check CT of the chest for staging. Keep the patient n.p.o. continue IV fluids.  Mesenteric mass: Noted on CT concerned about carcinoid for surgical intervention hopefully pathology could be obtained and sent for review.  Hypothyroidism: Continue Synthroid.  Hyperlipidemia: On no medications.    DVT prophylaxis: lovenox Family Communication:none Status is: Observation The patient will require care spanning > 2 midnights and should be moved to inpatient because: Small bowel partial obstruction    Code Status:     Code Status Orders  (From admission, onward)           Start     Ordered   05/16/22 2151  Full code  Continuous        05/16/22 2152           Code Status History     This patient has a current code status but no historical code status.         IV Access:   Peripheral IV   Procedures and diagnostic studies:   CT ABDOMEN PELVIS W CONTRAST  Result Date: 05/16/2022 CLINICAL DATA:  Upper abdominal pain with nausea vomiting EXAM: CT ABDOMEN  AND PELVIS WITH CONTRAST TECHNIQUE: Multidetector CT imaging of the abdomen and pelvis was performed using the standard protocol following bolus administration of intravenous contrast. RADIATION DOSE REDUCTION: This exam was performed according to the departmental dose-optimization program which includes automated exposure control, adjustment of the mA and/or kV according to patient size and/or use of iterative reconstruction technique. CONTRAST:  1107m OMNIPAQUE IOHEXOL 300 MG/ML  SOLN COMPARISON:  None Available. FINDINGS: Lower chest: Lung bases demonstrate no acute consolidation or effusion. Hepatobiliary: Multiple hyperenhancing liver masses are visualized. The largest is seen within the left hepatic lobe and measures approximately 4.7 cm and demonstrates central low density. No calcified gallstone. Mildly prominent common bile duct up to 7 mm. Pancreas: Unremarkable. No pancreatic ductal dilatation or surrounding inflammatory changes. Spleen: Normal in size without focal abnormality. Adrenals/Urinary Tract: Adrenal glands are unremarkable. Kidneys are normal, without renal calculi, focal lesion, or hydronephrosis. Bladder is unremarkable. Stomach/Bowel: The stomach is nonenlarged. Negative appendix. Fluid-filled mildly dilated mid to distal small bowel with some fecalized segments but no well-defined transition point. Some mesenteric vascular congestion. No intramural air or bowel wall thickening. Negative appendix. Focal area of hyperenhancing small bowel, series 2, image 41, coronal series 4, image 35. Vascular/Lymphatic: Nonaneurysmal aorta.  No suspicious lymph nodes. Reproductive: Uterus and bilateral adnexa are unremarkable. Other: No free air. Small moderate volume free fluid. Avidly enhancing central mesenteric mass measuring 2.9 by 2 cm, series 2, image 47 Musculoskeletal: No acute or significant osseous findings. IMPRESSION: 1. Multiple  mildly dilated fluid-filled loops of mid small bowel, suspicious  for partial small bowel obstruction, poorly defined transition point. Some mesenteric vascular congestion but no intramural air or bowel wall thickening. 2. 2.9 cm enhancing central mesenteric mass. 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 Focal segment of hyperenhancing small bowel could be potential source/site of primary. 3. Small moderate volume free fluid in the pelvis 4. Mildly prominent common bile duct which Ajanae be correlated with LFTs. Electronically Signed   By: Donavan Foil M.D.   On: 05/16/2022 19:45     Medical Consultants:   None.   Subjective:    Linda Villanueva no vomiting.  Objective:    Vitals:   05/17/22 0300 05/17/22 0400 05/17/22 0500 05/17/22 0600  BP: 110/69 118/77 105/64 130/72  Pulse: (!) 59 (!) 58 62 70  Resp: '19 20 20 20  '$ Temp:      TempSrc:      SpO2: 99% 99% 99% 98%  Weight:      Height:       SpO2: 98 %  No intake or output data in the 24 hours ending 05/17/22 0720 Filed Weights   05/16/22 1610  Weight: 78 kg    Exam: General exam: In no acute distress. Respiratory system: Good air movement and clear to auscultation. Cardiovascular system: S1 & S2 heard, RRR. No JVD. Gastrointestinal system: Abdomen is nondistended, soft and nontender.  Extremities: No pedal edema. Skin: No rashes, lesions or ulcers Psychiatry: Judgement and insight appear normal. Mood & affect appropriate.    Data Reviewed:    Labs: Basic Metabolic Panel: Recent Labs  Lab 05/16/22 1654 05/17/22 0514  NA 141 142  K 4.1 3.5  CL 107 114*  CO2 25 25  GLUCOSE 120* 101*  BUN 10 6  CREATININE 0.51 0.61  CALCIUM 9.2 8.2*   GFR Estimated Creatinine Clearance: 91.6 mL/min (by C-G formula based on SCr of 0.61 mg/dL). Liver Function Tests: Recent Labs  Lab 05/16/22 1654 05/17/22 0514  AST 21 16  ALT 15 14  ALKPHOS 78 64  BILITOT 0.7 0.7  PROT 8.1 6.4*  ALBUMIN 4.1 3.2*    Recent Labs  Lab 05/16/22 1654  LIPASE 28   No results for input(s): "AMMONIA" in the last 168 hours. Coagulation profile No results for input(s): "INR", "PROTIME" in the last 168 hours. COVID-19 Labs  No results for input(s): "DDIMER", "FERRITIN", "LDH", "CRP" in the last 72 hours.  Lab Results  Component Value Date   [place] Not Detected 10/27/2019    CBC: Recent Labs  Lab 05/16/22 1654 05/17/22 0514  WBC 8.6 8.7  NEUTROABS 5.7  --   HGB 13.7 12.1  HCT 42.3 37.3  MCV 86.3 88.2  PLT 337 283   Cardiac Enzymes: No results for input(s): "CKTOTAL", "CKMB", "CKMBINDEX", "TROPONINI" in the last 168 hours. BNP (last 3 results) No results for input(s): "PROBNP" in the last 8760 hours. CBG: No results for input(s): "GLUCAP" in the last 168 hours. D-Dimer: No results for input(s): "DDIMER" in the last 72 hours. Hgb A1c: No results for input(s): "HGBA1C" in the last 72 hours. Lipid Profile: No results for input(s): "CHOL", "HDL", "LDLCALC", "TRIG", "CHOLHDL", "LDLDIRECT" in the last 72 hours. Thyroid function studies: No results for input(s): "TSH", "T4TOTAL", "T3FREE", "THYROIDAB" in the last 72 hours.  Invalid input(s): "FREET3" Anemia work up: No results for input(s): "VITAMINB12", "FOLATE", "FERRITIN", "TIBC", "IRON", "RETICCTPCT" in the last 72 hours. Sepsis  Labs: Recent Labs  Lab 05/16/22 1654 05/17/22 0514  WBC 8.6 8.7   Microbiology No results found for this or any previous visit (from the past 240 hour(s)).   Medications:    enoxaparin (LOVENOX) injection  40 mg Subcutaneous Q24H   sodium chloride flush  3 mL Intravenous Q12H   Continuous Infusions:  sodium chloride 125 mL/hr at 05/16/22 2212      LOS: 0 days   Charlynne Cousins  [facility]  05/17/2022, 7:20 AM

## 2022-05-17 NOTE — ED Notes (Signed)
Pt appears to be sleeping, even RR and unlabored, NAD noted, call bell in reach, side rails up x2 for safety, care on going, will continue to monitor. Pt remains on cardiac monitor.

## 2022-05-18 ENCOUNTER — Encounter (HOSPITAL_COMMUNITY): Payer: Self-pay | Admitting: Internal Medicine

## 2022-05-18 ENCOUNTER — Other Ambulatory Visit: Payer: Self-pay

## 2022-05-18 ENCOUNTER — Inpatient Hospital Stay (HOSPITAL_COMMUNITY): Payer: Medicaid Other | Admitting: Anesthesiology

## 2022-05-18 ENCOUNTER — Encounter (HOSPITAL_COMMUNITY): Admission: EM | Disposition: A | Discharge status: Home / Self Care | Payer: Self-pay | Source: Home / Self Care

## 2022-05-18 DIAGNOSIS — K6389 Other specified diseases of intestine: Secondary | ICD-10-CM

## 2022-05-18 DIAGNOSIS — R160 Hepatomegaly, not elsewhere classified: Secondary | ICD-10-CM

## 2022-05-18 DIAGNOSIS — K56609 Unspecified intestinal obstruction, unspecified as to partial versus complete obstruction: Secondary | ICD-10-CM

## 2022-05-18 DIAGNOSIS — C7A8 Other malignant neuroendocrine tumors: Secondary | ICD-10-CM | POA: Diagnosis not present

## 2022-05-18 DIAGNOSIS — K769 Liver disease, unspecified: Secondary | ICD-10-CM | POA: Diagnosis not present

## 2022-05-18 DIAGNOSIS — C7B8 Other secondary neuroendocrine tumors: Secondary | ICD-10-CM | POA: Diagnosis not present

## 2022-05-18 DIAGNOSIS — E0590 Thyrotoxicosis, unspecified without thyrotoxic crisis or storm: Secondary | ICD-10-CM | POA: Diagnosis not present

## 2022-05-18 DIAGNOSIS — K56600 Partial intestinal obstruction, unspecified as to cause: Secondary | ICD-10-CM | POA: Diagnosis not present

## 2022-05-18 HISTORY — PX: LAPAROTOMY: SHX154

## 2022-05-18 LAB — SURGICAL PCR SCREEN
MRSA, PCR: NEGATIVE
Staphylococcus aureus: NEGATIVE

## 2022-05-18 SURGERY — LAPAROTOMY, EXPLORATORY
Anesthesia: General | Site: Abdomen

## 2022-05-18 MED ORDER — GABAPENTIN 300 MG PO CAPS
ORAL_CAPSULE | ORAL | Status: AC
  Filled 2022-05-18: qty 1

## 2022-05-18 MED ORDER — ROCURONIUM BROMIDE 10 MG/ML (PF) SYRINGE
PREFILLED_SYRINGE | INTRAVENOUS | Status: AC
Start: 2022-05-18 — End: ?
  Filled 2022-05-18: qty 10

## 2022-05-18 MED ORDER — SUGAMMADEX SODIUM 200 MG/2ML IV SOLN
INTRAVENOUS | Status: DC | PRN
  Administered 2022-05-18: 160 mg via INTRAVENOUS

## 2022-05-18 MED ORDER — ONDANSETRON HCL 4 MG/2ML IJ SOLN
INTRAMUSCULAR | Status: AC
Start: 2022-05-18 — End: ?
  Filled 2022-05-18: qty 2

## 2022-05-18 MED ORDER — MAGIC MOUTHWASH: 15.0000 mL | Freq: Four times a day (QID) | ORAL | Status: DC | PRN

## 2022-05-18 MED ORDER — PROCHLORPERAZINE MALEATE 10 MG PO TABS: 10.0000 mg | ORAL_TABLET | Freq: Four times a day (QID) | ORAL | Status: DC | PRN

## 2022-05-18 MED ORDER — METOPROLOL TARTRATE 5 MG/5ML IV SOLN: 5.0000 mg | Freq: Four times a day (QID) | INTRAVENOUS | Status: DC | PRN

## 2022-05-18 MED ORDER — MICROFIBRILLAR COLL HEMOSTAT EX PADS
MEDICATED_PAD | CUTANEOUS | Status: DC | PRN
  Administered 2022-05-18: 1 via TOPICAL

## 2022-05-18 MED ORDER — LACTATED RINGERS IV SOLN: INTRAVENOUS | Status: DC | PRN

## 2022-05-18 MED ORDER — FENTANYL CITRATE (PF) 250 MCG/5ML IJ SOLN
INTRAMUSCULAR | Status: DC | PRN
  Administered 2022-05-18 (×2): 50 ug via INTRAVENOUS
  Administered 2022-05-18 (×2): 100 ug via INTRAVENOUS
  Administered 2022-05-18: 50 ug via INTRAVENOUS

## 2022-05-18 MED ORDER — DIPHENHYDRAMINE HCL 12.5 MG/5ML PO ELIX: 12.5000 mg | ORAL_SOLUTION | Freq: Four times a day (QID) | ORAL | Status: DC | PRN

## 2022-05-18 MED ORDER — GABAPENTIN 300 MG PO CAPS: 300.0000 mg | ORAL_CAPSULE | ORAL | Status: DC

## 2022-05-18 MED ORDER — ALVIMOPAN 12 MG PO CAPS
12.0000 mg | ORAL_CAPSULE | Freq: Two times a day (BID) | ORAL | Status: DC
  Administered 2022-05-19 – 2022-05-21 (×6): 12 mg via ORAL
  Filled 2022-05-18 (×6): qty 1

## 2022-05-18 MED ORDER — LACTATED RINGERS IV BOLUS: 1000.0000 mL | Freq: Three times a day (TID) | INTRAVENOUS | Status: DC | PRN

## 2022-05-18 MED ORDER — LACTATED RINGERS IV SOLN: INTRAVENOUS | Status: DC

## 2022-05-18 MED ORDER — HYDROMORPHONE HCL 1 MG/ML IJ SOLN
0.5000 mg | INTRAMUSCULAR | Status: DC | PRN
  Administered 2022-05-18: 2 mg via INTRAVENOUS
  Administered 2022-05-18: 1 mg via INTRAVENOUS
  Filled 2022-05-18: qty 1
  Filled 2022-05-18: qty 2

## 2022-05-18 MED ORDER — ACETAMINOPHEN 500 MG PO TABS
ORAL_TABLET | ORAL | Status: AC
  Filled 2022-05-18: qty 2

## 2022-05-18 MED ORDER — ENOXAPARIN SODIUM 40 MG/0.4ML IJ SOSY
40.0000 mg | PREFILLED_SYRINGE | INTRAMUSCULAR | Status: AC
  Administered 2022-05-18: 40 mg via SUBCUTANEOUS
  Filled 2022-05-18: qty 0.4

## 2022-05-18 MED ORDER — SODIUM CHLORIDE 0.9 % IV SOLN: 250.0000 mL | INTRAVENOUS | Status: DC | PRN

## 2022-05-18 MED ORDER — ONDANSETRON HCL 4 MG PO TABS
4.0000 mg | ORAL_TABLET | Freq: Four times a day (QID) | ORAL | Status: DC | PRN
Start: 2022-05-18 — End: 2022-05-23
  Filled 2022-05-18: qty 1

## 2022-05-18 MED ORDER — AMISULPRIDE (ANTIEMETIC) 5 MG/2ML IV SOLN: 10.0000 mg | Freq: Once | INTRAVENOUS | Status: DC | PRN

## 2022-05-18 MED ORDER — ROCURONIUM BROMIDE 10 MG/ML (PF) SYRINGE
PREFILLED_SYRINGE | INTRAVENOUS | Status: DC | PRN
  Administered 2022-05-18: 10 mg via INTRAVENOUS
  Administered 2022-05-18: 50 mg via INTRAVENOUS

## 2022-05-18 MED ORDER — SUCCINYLCHOLINE CHLORIDE 200 MG/10ML IV SOSY
PREFILLED_SYRINGE | INTRAVENOUS | Status: DC | PRN
  Administered 2022-05-18: 80 mg via INTRAVENOUS

## 2022-05-18 MED ORDER — BUPIVACAINE LIPOSOME 1.3 % IJ SUSP
INTRAMUSCULAR | Status: DC | PRN
  Administered 2022-05-18: 20 mL

## 2022-05-18 MED ORDER — SODIUM CHLORIDE 0.9% FLUSH: 3.0000 mL | Freq: Two times a day (BID) | INTRAVENOUS | Status: DC

## 2022-05-18 MED ORDER — MELATONIN 3 MG PO TABS: 3.0000 mg | ORAL_TABLET | Freq: Every evening | ORAL | Status: DC | PRN

## 2022-05-18 MED ORDER — HYDRALAZINE HCL 20 MG/ML IJ SOLN: 10.0000 mg | INTRAMUSCULAR | Status: DC | PRN

## 2022-05-18 MED ORDER — LIDOCAINE HCL 2 % IJ SOLN
INTRAMUSCULAR | Status: AC
Start: 2022-05-18 — End: ?
  Filled 2022-05-18: qty 20

## 2022-05-18 MED ORDER — CELECOXIB 200 MG PO CAPS
ORAL_CAPSULE | ORAL | Status: AC
  Filled 2022-05-18: qty 1

## 2022-05-18 MED ORDER — DEXAMETHASONE SODIUM PHOSPHATE 10 MG/ML IJ SOLN
INTRAMUSCULAR | Status: DC | PRN
  Administered 2022-05-18: 8 mg via INTRAVENOUS

## 2022-05-18 MED ORDER — ONDANSETRON HCL 4 MG/2ML IJ SOLN
4.0000 mg | Freq: Four times a day (QID) | INTRAMUSCULAR | Status: DC | PRN
  Administered 2022-05-18 – 2022-05-21 (×3): 4 mg via INTRAVENOUS
  Filled 2022-05-18 (×3): qty 2

## 2022-05-18 MED ORDER — TRAMADOL HCL 50 MG PO TABS
50.0000 mg | ORAL_TABLET | Freq: Four times a day (QID) | ORAL | Status: DC | PRN
  Administered 2022-05-18 – 2022-05-19 (×2): 50 mg via ORAL
  Administered 2022-05-19: 100 mg via ORAL
  Administered 2022-05-19: 50 mg via ORAL
  Administered 2022-05-20 – 2022-05-21 (×2): 100 mg via ORAL
  Administered 2022-05-21 – 2022-05-23 (×4): 50 mg via ORAL
  Filled 2022-05-18: qty 1
  Filled 2022-05-18: qty 2
  Filled 2022-05-18: qty 1
  Filled 2022-05-18: qty 2
  Filled 2022-05-18: qty 1
  Filled 2022-05-18: qty 2
  Filled 2022-05-18 (×3): qty 1
  Filled 2022-05-18: qty 2

## 2022-05-18 MED ORDER — SIMETHICONE 80 MG PO CHEW: 40.0000 mg | CHEWABLE_TABLET | Freq: Four times a day (QID) | ORAL | Status: DC | PRN

## 2022-05-18 MED ORDER — BUPIVACAINE LIPOSOME 1.3 % IJ SUSP
INTRAMUSCULAR | Status: AC
  Filled 2022-05-18: qty 20

## 2022-05-18 MED ORDER — CELECOXIB 200 MG PO CAPS: 200.0000 mg | ORAL_CAPSULE | ORAL | Status: DC

## 2022-05-18 MED ORDER — LIDOCAINE 2% (20 MG/ML) 5 ML SYRINGE
INTRAMUSCULAR | Status: DC | PRN
  Administered 2022-05-18: 1.5 mg/kg/h via INTRAVENOUS

## 2022-05-18 MED ORDER — BUPIVACAINE-EPINEPHRINE (PF) 0.5% -1:200000 IJ SOLN
INTRAMUSCULAR | Status: AC
  Filled 2022-05-18: qty 30

## 2022-05-18 MED ORDER — SODIUM CHLORIDE 0.9 % IV SOLN
2.0000 g | Freq: Two times a day (BID) | INTRAVENOUS | Status: AC
  Administered 2022-05-19: 2 g via INTRAVENOUS
  Filled 2022-05-18: qty 2

## 2022-05-18 MED ORDER — LIP MEDEX EX OINT
TOPICAL_OINTMENT | Freq: Two times a day (BID) | CUTANEOUS | Status: DC
  Administered 2022-05-18 – 2022-05-19 (×2): 1 via TOPICAL
  Filled 2022-05-18 (×2): qty 7

## 2022-05-18 MED ORDER — MIDAZOLAM HCL 2 MG/2ML IJ SOLN
INTRAMUSCULAR | Status: AC
Start: 2022-05-18 — End: ?
  Filled 2022-05-18: qty 2

## 2022-05-18 MED ORDER — ALVIMOPAN 12 MG PO CAPS
ORAL_CAPSULE | ORAL | Status: AC
  Filled 2022-05-18: qty 1

## 2022-05-18 MED ORDER — ENSURE SURGERY PO LIQD
237.0000 mL | Freq: Two times a day (BID) | ORAL | Status: DC
Start: 2022-05-19 — End: 2022-05-19

## 2022-05-18 MED ORDER — SUCCINYLCHOLINE CHLORIDE 200 MG/10ML IV SOSY
PREFILLED_SYRINGE | INTRAVENOUS | Status: AC
  Filled 2022-05-18: qty 10

## 2022-05-18 MED ORDER — BUPIVACAINE LIPOSOME 1.3 % IJ SUSP: 20.0000 mL | INTRAMUSCULAR | Status: DC

## 2022-05-18 MED ORDER — GABAPENTIN 300 MG PO CAPS
300.0000 mg | ORAL_CAPSULE | Freq: Every day | ORAL | Status: DC
  Administered 2022-05-18: 300 mg via ORAL
  Filled 2022-05-18: qty 1

## 2022-05-18 MED ORDER — SODIUM CHLORIDE 0.9 % IV SOLN: INTRAVENOUS | Status: DC | PRN

## 2022-05-18 MED ORDER — ALUM & MAG HYDROXIDE-SIMETH 200-200-20 MG/5ML PO SUSP: 30.0000 mL | Freq: Four times a day (QID) | ORAL | Status: DC | PRN

## 2022-05-18 MED ORDER — PROPOFOL 10 MG/ML IV BOLUS
INTRAVENOUS | Status: DC | PRN
  Administered 2022-05-18: 120 mg via INTRAVENOUS

## 2022-05-18 MED ORDER — SODIUM CHLORIDE 0.9 % IV SOLN: Freq: Three times a day (TID) | INTRAVENOUS | Status: DC | PRN

## 2022-05-18 MED ORDER — LIDOCAINE HCL (CARDIAC) PF 100 MG/5ML IV SOSY
PREFILLED_SYRINGE | INTRAVENOUS | Status: DC | PRN
  Administered 2022-05-18: 70 mg via INTRAVENOUS

## 2022-05-18 MED ORDER — MIDAZOLAM HCL 2 MG/2ML IJ SOLN
INTRAMUSCULAR | Status: DC | PRN
  Administered 2022-05-18: 2 mg via INTRAVENOUS

## 2022-05-18 MED ORDER — ACETAMINOPHEN 500 MG PO TABS
1000.0000 mg | ORAL_TABLET | Freq: Four times a day (QID) | ORAL | Status: DC
  Administered 2022-05-18 – 2022-05-23 (×15): 1000 mg via ORAL
  Filled 2022-05-18 (×15): qty 2

## 2022-05-18 MED ORDER — LIDOCAINE HCL (PF) 2 % IJ SOLN
INTRAMUSCULAR | Status: AC
Start: 2022-05-18 — End: ?
  Filled 2022-05-18: qty 5

## 2022-05-18 MED ORDER — SODIUM CHLORIDE 0.9 % IV SOLN
2.0000 g | INTRAVENOUS | Status: AC
  Administered 2022-05-18: 2 g via INTRAVENOUS
  Filled 2022-05-18: qty 2

## 2022-05-18 MED ORDER — PROCHLORPERAZINE EDISYLATE 10 MG/2ML IJ SOLN
5.0000 mg | Freq: Four times a day (QID) | INTRAMUSCULAR | Status: DC | PRN
  Administered 2022-05-18: 10 mg via INTRAVENOUS
  Filled 2022-05-18: qty 2

## 2022-05-18 MED ORDER — PROPOFOL 10 MG/ML IV BOLUS
INTRAVENOUS | Status: AC
  Filled 2022-05-18: qty 20

## 2022-05-18 MED ORDER — METHOCARBAMOL 500 MG PO TABS: 1000.0000 mg | ORAL_TABLET | Freq: Four times a day (QID) | ORAL | Status: DC | PRN

## 2022-05-18 MED ORDER — CALCIUM POLYCARBOPHIL 625 MG PO TABS
625.0000 mg | ORAL_TABLET | Freq: Two times a day (BID) | ORAL | Status: DC
  Administered 2022-05-18 – 2022-05-23 (×10): 625 mg via ORAL
  Filled 2022-05-18 (×10): qty 1

## 2022-05-18 MED ORDER — DIPHENHYDRAMINE HCL 50 MG/ML IJ SOLN: 12.5000 mg | Freq: Four times a day (QID) | INTRAMUSCULAR | Status: DC | PRN

## 2022-05-18 MED ORDER — METHOCARBAMOL 1000 MG/10ML IJ SOLN: 1000.0000 mg | Freq: Four times a day (QID) | INTRAVENOUS | Status: DC | PRN

## 2022-05-18 MED ORDER — DEXAMETHASONE SODIUM PHOSPHATE 10 MG/ML IJ SOLN
INTRAMUSCULAR | Status: AC
  Filled 2022-05-18: qty 1

## 2022-05-18 MED ORDER — ACETAMINOPHEN 500 MG PO TABS: 1000.0000 mg | ORAL_TABLET | ORAL | Status: DC

## 2022-05-18 MED ORDER — HYDROMORPHONE HCL 1 MG/ML IJ SOLN
INTRAMUSCULAR | Status: AC
  Administered 2022-05-18: 0.5 mg
  Filled 2022-05-18: qty 2

## 2022-05-18 MED ORDER — 0.9 % SODIUM CHLORIDE (POUR BTL) OPTIME
TOPICAL | Status: DC | PRN
  Administered 2022-05-18: 2000 mL

## 2022-05-18 MED ORDER — SODIUM CHLORIDE 0.9% FLUSH: 3.0000 mL | INTRAVENOUS | Status: DC | PRN

## 2022-05-18 MED ORDER — ENOXAPARIN SODIUM 40 MG/0.4ML IJ SOSY: 40.0000 mg | PREFILLED_SYRINGE | INTRAMUSCULAR | Status: DC

## 2022-05-18 MED ORDER — HYDROMORPHONE HCL 1 MG/ML IJ SOLN
0.2500 mg | INTRAMUSCULAR | Status: DC | PRN
  Administered 2022-05-18 (×3): 0.5 mg via INTRAVENOUS

## 2022-05-18 MED ORDER — ONDANSETRON HCL 4 MG/2ML IJ SOLN
INTRAMUSCULAR | Status: DC | PRN
  Administered 2022-05-18: 4 mg via INTRAVENOUS

## 2022-05-18 MED ORDER — BUPIVACAINE-EPINEPHRINE 0.5% -1:200000 IJ SOLN
INTRAMUSCULAR | Status: DC | PRN
  Administered 2022-05-18: 30 mL

## 2022-05-18 MED ORDER — ALVIMOPAN 12 MG PO CAPS: 12.0000 mg | ORAL_CAPSULE | ORAL | Status: DC

## 2022-05-18 MED ORDER — FENTANYL CITRATE (PF) 250 MCG/5ML IJ SOLN
INTRAMUSCULAR | Status: AC
  Filled 2022-05-18: qty 5

## 2022-05-18 MED ORDER — FENTANYL CITRATE (PF) 100 MCG/2ML IJ SOLN
INTRAMUSCULAR | Status: AC
  Filled 2022-05-18: qty 2

## 2022-05-18 SURGICAL SUPPLY — 51 items
BAG COUNTER SPONGE SURGICOUNT (BAG) IMPLANT
BLADE EXTENDED COATED 6.5IN (ELECTRODE) IMPLANT
BLADE HEX COATED 2.75 (ELECTRODE) IMPLANT
CHLORAPREP W/TINT 26 (MISCELLANEOUS) ×2 IMPLANT
COUNTER NEEDLE 20 DBL MAG RED (NEEDLE) ×2 IMPLANT
COVER MAYO STAND STRL (DRAPES) ×2 IMPLANT
COVER SURGICAL LIGHT HANDLE (MISCELLANEOUS) ×2 IMPLANT
DRAIN CHANNEL 19F RND (DRAIN) IMPLANT
DRAPE LAPAROSCOPIC ABDOMINAL (DRAPES) ×2 IMPLANT
DRAPE SHEET LG 3/4 BI-LAMINATE (DRAPES) ×2 IMPLANT
DRAPE UTILITY XL STRL (DRAPES) ×4 IMPLANT
DRAPE WARM FLUID 44X44 (DRAPES) ×2 IMPLANT
DRSG OPSITE POSTOP 4X10 (GAUZE/BANDAGES/DRESSINGS) IMPLANT
DRSG OPSITE POSTOP 4X6 (GAUZE/BANDAGES/DRESSINGS) ×1 IMPLANT
DRSG OPSITE POSTOP 4X8 (GAUZE/BANDAGES/DRESSINGS) IMPLANT
ELECT REM PT RETURN 15FT ADLT (MISCELLANEOUS) ×2 IMPLANT
GAUZE SPONGE 4X4 12PLY STRL (GAUZE/BANDAGES/DRESSINGS) ×2 IMPLANT
GLOVE ECLIPSE 8.0 STRL XLNG CF (GLOVE) ×4 IMPLANT
GLOVE INDICATOR 8.0 STRL GRN (GLOVE) ×4 IMPLANT
GOWN STRL REUS W/ TWL XL LVL3 (GOWN DISPOSABLE) ×3 IMPLANT
GOWN STRL REUS W/TWL XL LVL3 (GOWN DISPOSABLE) ×6
HANDLE SUCTION POOLE (INSTRUMENTS) ×1 IMPLANT
HEMOSTAT SURGICEL 2X4 FIBR (HEMOSTASIS) ×1 IMPLANT
KIT BASIN OR (CUSTOM PROCEDURE TRAY) ×2 IMPLANT
KIT TURNOVER KIT A (KITS) IMPLANT
LEGGING LITHOTOMY PAIR STRL (DRAPES) IMPLANT
LIGASURE IMPACT 36 18CM CVD LR (INSTRUMENTS) ×1 IMPLANT
PACK GENERAL/GYN (CUSTOM PROCEDURE TRAY) ×2 IMPLANT
PAD POSITIONING PINK XL (MISCELLANEOUS) ×2 IMPLANT
STAPLER 90 3.5 STAND SLIM (STAPLE) ×2
STAPLER 90 3.5 STD SLIM (STAPLE) IMPLANT
STAPLER PROXIMATE 75MM BLUE (STAPLE) ×1 IMPLANT
STAPLER VISISTAT 35W (STAPLE) ×2 IMPLANT
SUCTION POOLE HANDLE (INSTRUMENTS) ×2
SUT MNCRL AB 4-0 PS2 18 (SUTURE) ×1 IMPLANT
SUT PDS AB 1 TP1 96 (SUTURE) ×2 IMPLANT
SUT PROLENE 2 0 SH DA (SUTURE) IMPLANT
SUT SILK 0 (SUTURE)
SUT SILK 0 30XBRD TIE 6 (SUTURE) IMPLANT
SUT SILK 0 SH 30 (SUTURE) ×2 IMPLANT
SUT SILK 2 0 (SUTURE) ×2
SUT SILK 2 0 SH CR/8 (SUTURE) ×3 IMPLANT
SUT SILK 2-0 18XBRD TIE 12 (SUTURE) ×1 IMPLANT
SUT SILK 3 0 (SUTURE) ×2
SUT SILK 3 0 SH CR/8 (SUTURE) ×2 IMPLANT
SUT SILK 3-0 18XBRD TIE 12 (SUTURE) ×1 IMPLANT
SUT VICRYL 0 UR6 27IN ABS (SUTURE) IMPLANT
TAPE UMBILICAL 1/8 X36 TWILL (MISCELLANEOUS) ×2 IMPLANT
TOWEL OR 17X26 10 PK STRL BLUE (TOWEL DISPOSABLE) ×4 IMPLANT
TOWEL OR NON WOVEN STRL DISP B (DISPOSABLE) ×4 IMPLANT
TRAY FOLEY MTR SLVR 16FR STAT (SET/KITS/TRAYS/PACK) ×2 IMPLANT

## 2022-05-18 NOTE — Interval H&P Note (Signed)
History and Physical Interval Note:  05/18/2022 12:33 PM  Linda Villanueva  has presented today for surgery, with the diagnosis of INCARCERATED SMALL BOWEL.  The various methods of treatment have been discussed with the patient and family. After consideration of risks, benefits and other options for treatment, the patient has consented to  Procedure(s): EXPLORATORY LAPAROTOMY; SMALL BOWEL RESECTION (N/A) as a surgical intervention.  The patient's history has been reviewed, patient examined, no change in status, stable for surgery.  I have reviewed the patient's chart and labs.  Questions were answered to the patient's satisfaction.    The anatomy & physiology of the digestive tract was discussed.  The pathophysiology of perforation was discussed.  Differential diagnosis such as perforated ulcer or colon, etc was discussed.   Natural history risks without surgery such as death was discussed.  I recommended abdominal exploration to diagnose & treat the source of the problem.  Laparoscopic & open techniques were discussed.   Risks such as bleeding, infection, abscess, leak, reoperation, bowel resection, possible ostomy, injury to other organs, need for repair of tissues / organs, hernia, heart attack, death, and other risks were discussed.   The risks of no intervention will lead to serious problems including death.   I expressed a good likelihood that surgery will address the problem.    Goals of post-operative recovery were discussed as well.  We will work to minimize complications although risks in an emergent setting are high.   Questions were answered.  The patient expressed understanding & wishes to proceed with surgery.        I have re-reviewed the the patient's records, history, medications, and allergies.  I have re-examined the patient.  I again discussed intraoperative plans and goals of post-operative recovery.  The patient agrees to proceed.  [place]   06-21-1971 [id number]  Patient Care Team: Billie Ruddy, MD as PCP - General (Family Medicine)  Patient Active Problem List   Diagnosis Date Noted   SBO (small bowel obstruction) ([place]) 05/16/2022   Mesenteric mass 05/16/2022   Mixed hyperlipidemia 07/26/2020   Hyperthyroidism 07/12/2020   Vitamin D deficiency    Vitamin B12 deficiency     Past Medical History:  Diagnosis Date   Vitamin B12 deficiency    Vitamin D deficiency     Past Surgical History:  Procedure Laterality Date   INNER EAR SURGERY      Social History   Socioeconomic History   Marital status: Married    Spouse name: Not on file   Number of children: Not on file   Years of education: Not on file   Highest education level: Not on file  Occupational History   Not on file  Tobacco Use   Smoking status: Never   Smokeless tobacco: Never  Vaping Use   Vaping Use: Never used  Substance and Sexual Activity   Alcohol use: No   Drug use: No   Sexual activity: Yes    Partners: Male    Birth control/protection: I.U.D.  Other Topics Concern   Not on file  Social History Narrative   Not on file   Social Determinants of Health   Financial Resource Strain: Not on file  Food Insecurity: Not on file  Transportation Needs: Not on file  Physical Activity: Not on file  Stress: Not on file  Social Connections: Not on file  Intimate Partner Violence: Not on file    Family History  Problem Relation Age of Onset  Hypertension Mother    Hypertension Father    Thyroid disease Sister     Medications Prior to Admission  Medication Sig Dispense Refill Last Dose   cycloSPORINE (RESTASIS) 0.05 % ophthalmic emulsion Place 1 drop into both eyes 2 (two) times daily. (Patient not taking: Reported on 05/16/2022)   Not Taking   metroNIDAZOLE (METROGEL) 1 % gel Apply thin layer to clean face once daily. (Patient not taking: Reported on 05/16/2022) 45 g 0 Not Taking    Current Facility-Administered Medications   Medication Dose Route Frequency Provider Last Rate Last Admin   0.9 %  sodium chloride infusion   Intravenous Continuous Marcelyn Bruins, MD 125 mL/hr at 05/18/22 1026 New Bag at 05/18/22 1026   acetaminophen (TYLENOL) 500 MG tablet            [MAR Hold] acetaminophen (TYLENOL) tablet 650 mg  650 mg Oral Q6H PRN Marcelyn Bruins, MD   650 mg at 05/17/22 2120   Or   [MAR Hold] acetaminophen (TYLENOL) suppository 650 mg  650 mg Rectal Q6H PRN Marcelyn Bruins, MD       [START ON 05/19/2022] acetaminophen (TYLENOL) tablet 1,000 mg  1,000 mg Oral On Call to OR Michael Boston, MD       alvimopan (ENTEREG) 12 MG capsule            [START ON 05/19/2022] alvimopan (ENTEREG) capsule 12 mg  12 mg Oral On Call to OR Michael Boston, MD       [START ON 05/19/2022] bupivacaine liposome (EXPAREL) 1.3 % injection 266 mg  20 mL Infiltration On Call to OR Michael Boston, MD       [START ON 05/19/2022] cefoTEtan (CEFOTAN) 2 g in sodium chloride 0.9 % 100 mL IVPB  2 g Intravenous On Call to OR Michael Boston, MD       celecoxib (CELEBREX) 200 MG capsule            [START ON 05/19/2022] celecoxib (CELEBREX) capsule 200 mg  200 mg Oral On Call to OR Michael Boston, MD       [facility] Hold] enoxaparin (LOVENOX) injection 40 mg  40 mg Subcutaneous Q24H Marcelyn Bruins, MD   40 mg at 05/17/22 2120   [START ON 05/19/2022] enoxaparin (LOVENOX) injection 40 mg  40 mg Subcutaneous On Call to OR Charlynne Cousins, MD       gabapentin (NEURONTIN) 300 MG capsule            [START ON 05/19/2022] gabapentin (NEURONTIN) capsule 300 mg  300 mg Oral On Call to OR Michael Boston, MD       [facility] Hold] sodium chloride flush (NS) 0.9 % injection 3 mL  3 mL Intravenous Q12H Marcelyn Bruins, MD   3 mL at 05/18/22 0848     No Known Allergies  BP 136/76 (BP Location: Left Arm)   Pulse (!) 109   Temp 98.4 F (36.9 C) (Oral)   Resp 18   Ht '5\' 7"'$  (1.702 m)   Wt 75.9 kg   LMP 04/27/2022 Comment: negative urine pregnancy test 05/16/22   SpO2 100%   BMI 26.21 kg/m   Labs: Results for orders placed or performed during the hospital encounter of 05/16/22 (from the past 48 hour(s))  Urinalysis, Routine w reflex microscopic Urine, Clean Catch     Status: Abnormal   Collection Time: 05/16/22  4:38 PM  Result Value Ref Range   Color, Urine YELLOW YELLOW   APPearance  HAZY (A) CLEAR   Specific Gravity, Urine 1.020 1.005 - 1.030   pH 6.0 5.0 - 8.0   Glucose, UA NEGATIVE NEGATIVE mg/dL   Hgb urine dipstick LARGE (A) NEGATIVE   Bilirubin Urine NEGATIVE NEGATIVE   Ketones, ur NEGATIVE NEGATIVE mg/dL   Protein, ur NEGATIVE NEGATIVE mg/dL   Nitrite NEGATIVE NEGATIVE   Leukocytes,Ua TRACE (A) NEGATIVE   RBC / HPF 6-10 0 - 5 RBC/hpf   WBC, UA 6-10 0 - 5 WBC/hpf   Bacteria, UA MANY (A) NONE SEEN   Squamous Epithelial / LPF 0-5 0 - 5   Mucus PRESENT     Comment: Performed at [facility], [place] [address] [place], [place] [address]  Pregnancy, urine     Status: None   Collection Time: 05/16/22  4:38 PM  Result Value Ref Range   Preg Test, Ur NEGATIVE NEGATIVE    Comment:        THE SENSITIVITY OF THIS METHODOLOGY IS >20 mIU/mL. Performed at [facility], [place] [address] [place], [place] [address]   Lipase, blood     Status: None   Collection Time: 05/16/22  4:54 PM  Result Value Ref Range   Lipase 28 11 - 51 U/L    Comment: Performed at [facility], [place] [address] [place], [place] [address]  Comprehensive metabolic panel     Status: Abnormal   Collection Time: 05/16/22  4:54 PM  Result Value Ref Range   Sodium 141 135 - 145 mmol/L   Potassium 4.1 3.5 - 5.1 mmol/L   Chloride 107 98 - 111 mmol/L   CO2 25 22 - 32 mmol/L   Glucose, Bld 120 (H) 70 - 99 mg/dL    Comment: Glucose reference range applies only to samples taken after fasting for at least 8 hours.   BUN 10 6 - 20 mg/dL   Creatinine, Ser 0.51 0.44 - 1.00 mg/dL   Calcium 9.2 8.9 - 10.3 mg/dL   Total  Protein 8.1 6.5 - 8.1 g/dL   Albumin 4.1 3.5 - 5.0 g/dL   AST 21 15 - 41 U/L   ALT 15 0 - 44 U/L   Alkaline Phosphatase 78 38 - 126 U/L   Total Bilirubin 0.7 0.3 - 1.2 mg/dL   GFR, Estimated >60 >60 mL/min    Comment: (NOTE) Calculated using the CKD-EPI Creatinine Equation (2021)    Anion gap 9 5 - 15    Comment: Performed at [facility], [place] [address], [place], [place] [address]  CBC with Differential     Status: None   Collection Time: 05/16/22  4:54 PM  Result Value Ref Range   WBC 8.6 4.0 - 10.5 K/uL   RBC 4.90 3.87 - 5.11 MIL/uL   Hemoglobin 13.7 12.0 - 15.0 g/dL   HCT 42.3 36.0 - 46.0 %   MCV 86.3 80.0 - 100.0 fL   MCH 28.0 26.0 - 34.0 pg   MCHC 32.4 30.0 - 36.0 g/dL   RDW 14.2 11.5 - 15.5 %   Platelets 337 150 - 400 K/uL   nRBC 0.0 0.0 - 0.2 %   Neutrophils Relative % 65 %   Neutro Abs 5.7 1.7 - 7.7 K/uL   Lymphocytes Relative 28 %   Lymphs Abs 2.4 0.7 - 4.0 K/uL   Monocytes Relative 6 %   Monocytes Absolute 0.5 0.1 - 1.0 K/uL   Eosinophils Relative 1 %   Eosinophils Absolute 0.1 0.0 -  0.5 K/uL   Basophils Relative 0 %   Basophils Absolute 0.0 0.0 - 0.1 K/uL   Immature Granulocytes 0 %   Abs Immature Granulocytes 0.02 0.00 - 0.07 K/uL    Comment: Performed at [facility], [place] [address] [place], [place] [address]  HIV Antibody (routine testing w rflx)     Status: None   Collection Time: 05/16/22  9:44 PM  Result Value Ref Range   HIV Screen 4th Generation wRfx Non Reactive Non Reactive    Comment: Performed at [facility], [place] [address], [place], [place] [address]  Comprehensive metabolic panel     Status: Abnormal   Collection Time: 05/17/22  5:14 AM  Result Value Ref Range   Sodium 142 135 - 145 mmol/L   Potassium 3.5 3.5 - 5.1 mmol/L   Chloride 114 (H) 98 - 111 mmol/L   CO2 25 22 - 32 mmol/L   Glucose, Bld 101 (H) 70 - 99 mg/dL    Comment: Glucose reference range applies only to samples taken after fasting  for at least 8 hours.   BUN 6 6 - 20 mg/dL   Creatinine, Ser 0.61 0.44 - 1.00 mg/dL   Calcium 8.2 (L) 8.9 - 10.3 mg/dL   Total Protein 6.4 (L) 6.5 - 8.1 g/dL   Albumin 3.2 (L) 3.5 - 5.0 g/dL   AST 16 15 - 41 U/L   ALT 14 0 - 44 U/L   Alkaline Phosphatase 64 38 - 126 U/L   Total Bilirubin 0.7 0.3 - 1.2 mg/dL   GFR, Estimated >60 >60 mL/min    Comment: (NOTE) Calculated using the CKD-EPI Creatinine Equation (2021)    Anion gap 3 (L) 5 - 15    Comment: Performed at [facility], [place] [address] [place], [place] [address]  CBC     Status: None   Collection Time: 05/17/22  5:14 AM  Result Value Ref Range   WBC 8.7 4.0 - 10.5 K/uL   RBC 4.23 3.87 - 5.11 MIL/uL   Hemoglobin 12.1 12.0 - 15.0 g/dL   HCT 37.3 36.0 - 46.0 %   MCV 88.2 80.0 - 100.0 fL   MCH 28.6 26.0 - 34.0 pg   MCHC 32.4 30.0 - 36.0 g/dL   RDW 14.4 11.5 - 15.5 %   Platelets 283 150 - 400 K/uL   nRBC 0.0 0.0 - 0.2 %    Comment: Performed at [facility], [place] [address], [place], [place] [address]    Imaging / Studies: CT CHEST WO CONTRAST  Result Date: 05/17/2022 CLINICAL DATA:  Colon cancer, metastatic, staging EXAM: CT CHEST WITHOUT CONTRAST TECHNIQUE: Multidetector CT imaging of the chest was performed following the standard protocol without IV contrast. RADIATION DOSE REDUCTION: This exam was performed according to the departmental dose-optimization program which includes automated exposure control, adjustment of the mA and/or kV according to patient size and/or use of iterative reconstruction technique. COMPARISON:  None Available. FINDINGS: Cardiovascular: Normal cardiac size. No pericardial disease. Normal size main and branch pulmonary arteries. The thoracic aorta is unremarkable. Mediastinum/Nodes: No lymphadenopathy. The thyroid is unremarkable. The esophagus is unremarkable. Lungs/Pleura: There is a solid 3 mm right upper lobe pulmonary nodule (series 4, image 22). No focal  airspace disease. No pleural effusion. No pneumothorax. Upper Abdomen: Multiple liver masses best seen on contrast enhanced CT of the abdomen pelvis acquired yesterday. Musculoskeletal: No acute osseous abnormality. No suspicious osseous lesion. IMPRESSION: Indeterminate solid 3  mm right upper lobe pulmonary nodule. Multiple liver masses best seen on recent contrast enhanced CT of the abdomen and pelvis. Electronically Signed   By: Maurine Simmering M.D.   On: 05/17/2022 08:15   CT ABDOMEN PELVIS W CONTRAST  Result Date: 05/16/2022 CLINICAL DATA:  Upper abdominal pain with nausea vomiting EXAM: CT ABDOMEN AND PELVIS WITH CONTRAST TECHNIQUE: Multidetector CT imaging of the abdomen and pelvis was performed using the standard protocol following bolus administration of intravenous contrast. RADIATION DOSE REDUCTION: This exam was performed according to the departmental dose-optimization program which includes automated exposure control, adjustment of the mA and/or kV according to patient size and/or use of iterative reconstruction technique. CONTRAST:  152m OMNIPAQUE IOHEXOL 300 MG/ML  SOLN COMPARISON:  None Available. FINDINGS: Lower chest: Lung bases demonstrate no acute consolidation or effusion. Hepatobiliary: Multiple hyperenhancing liver masses are visualized. The largest is seen within the left hepatic lobe and measures approximately 4.7 cm and demonstrates central low density. No calcified gallstone. Mildly prominent common bile duct up to 7 mm. Pancreas: Unremarkable. No pancreatic ductal dilatation or surrounding inflammatory changes. Spleen: Normal in size without focal abnormality. Adrenals/Urinary Tract: Adrenal glands are unremarkable. Kidneys are normal, without renal calculi, focal lesion, or hydronephrosis. Bladder is unremarkable. Stomach/Bowel: The stomach is nonenlarged. Negative appendix. Fluid-filled mildly dilated mid to distal small bowel with some fecalized segments but no well-defined transition  point. Some mesenteric vascular congestion. No intramural air or bowel wall thickening. Negative appendix. Focal area of hyperenhancing small bowel, series 2, image 41, coronal series 4, image 35. Vascular/Lymphatic: Nonaneurysmal aorta.  No suspicious lymph nodes. Reproductive: Uterus and bilateral adnexa are unremarkable. Other: No free air. Small moderate volume free fluid. Avidly enhancing central mesenteric mass measuring 2.9 by 2 cm, series 2, image 47 Musculoskeletal: No acute or significant osseous findings. IMPRESSION: 1. Multiple mildly dilated fluid-filled loops of mid small bowel, suspicious for partial small bowel obstruction, poorly defined transition point. Some mesenteric vascular congestion but no intramural air or bowel wall thickening. 2. 2.9 cm enhancing central mesenteric mass. 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 Focal segment of hyperenhancing small bowel could be potential source/site of primary. 3. Small moderate volume free fluid in the pelvis 4. Mildly prominent common bile duct which Margery be correlated with LFTs. Electronically Signed   By: KDonavan FoilM.D.   On: 05/16/2022 19:45   MR LUMBAR SPINE WO CONTRAST  Result Date: 04/22/2022 CLINICAL DATA:  Initial evaluation for chronic low back pain with radiation into the right lower extremity. EXAM: MRI LUMBAR SPINE WITHOUT CONTRAST TECHNIQUE: Multiplanar, multisequence MR imaging of the lumbar spine was performed. No intravenous contrast was administered. COMPARISON:  None available. FINDINGS: Segmentation: Standard. Lowest well-formed disc space labeled the L5-S1 level. Alignment: Physiologic with preservation of the normal lumbar lordosis. No listhesis. Vertebrae: Vertebral body height maintained without acute or chronic fracture. Bone marrow signal intensity within normal limits. Multiple scattered benign hemangiomata noted. No worrisome osseous  lesions. No abnormal marrow edema. Conus medullaris and cauda equina: Conus extends to the L1-2 level. Conus and cauda equina appear normal. Paraspinal and other soft tissues: Unremarkable. Disc levels: L1-2:  Unremarkable. L2-3:  Unremarkable. L3-4: Mild disc bulge. No spinal stenosis. Foramina remain patent. L4-5: Disc desiccation. Shallow central disc protrusion with annular fissure indents the ventral thecal sac (series 6, image 27). Resultant mild bilateral subarticular stenosis. Central canal remains patent. No significant foraminal narrowing. L5-S1: Normal interspace. Mild bilateral  facet hypertrophy. No canal or foraminal stenosis. IMPRESSION: 1. Shallow central disc protrusion at L4-5 with resultant mild bilateral subarticular stenosis. 2. Mild bilateral facet hypertrophy at L5-S1 without stenosis. Electronically Signed   By: Jeannine Boga M.D.   On: 04/22/2022 05:30     .Adin Hector, M.D., F.A.C.S. Gastrointestinal and Minimally Invasive Surgery [facility] [address], [place], [place] [address] [phone] Main / Paging  05/18/2022 12:33 PM    Adin Hector

## 2022-05-18 NOTE — Discharge Instructions (Signed)
SURGERY: POST OP INSTRUCTIONS (Surgery for small bowel obstruction, colon resection, etc)   ######################################################################  EAT Gradually transition to a high fiber diet with a fiber supplement over the next few days after discharge  WALK Walk an hour a day.  Control your pain to do that.    CONTROL PAIN Control pain so that Linda Villanueva can walk, sleep, tolerate sneezing/coughing, go up/down stairs.  HAVE A BOWEL MOVEMENT DAILY Keep your bowels regular to avoid problems.  OK to try a laxative to override constipation.  OK to use an antidairrheal to slow down diarrhea.  Call if not better after 2 tries  CALL IF Linda Villanueva HAVE PROBLEMS/CONCERNS Call if Linda Villanueva are still struggling despite following these instructions. Call if Linda Villanueva have concerns not answered by these instructions  ######################################################################   DIET Follow a light diet the first few days at home.  Start with a bland diet such as soups, liquids, starchy foods, low fat foods, etc.  If Linda Villanueva feel full, bloated, or constipated, stay on a ful liquid or pureed/blenderized diet for a few days until Linda Villanueva feel better and no longer constipated. Be sure to drink plenty of fluids every day to avoid getting dehydrated (feeling dizzy, not urinating, etc.). Gradually add a fiber supplement to your diet over the next week.  Gradually get back to a regular solid diet.  Avoid fast food or heavy meals the first week as Linda Villanueva are more likely to get nauseated. It is expected for your digestive tract to need a few months to get back to normal.  It is common for your bowel movements and stools to be irregular.  Linda Villanueva will have occasional bloating and cramping that should eventually fade away.  Until Linda Villanueva are eating solid food normally, off all pain medications, and back to regular activities; your bowels will not be normal. Focus on eating a low-fat, high fiber diet the rest of your life  (See Getting to [place], below).  CARE of your INCISION or WOUND  It is good for closed incisions and even open wounds to be washed every day.  Shower every day.  Short baths are fine.  Wash the incisions and wounds clean with soap & water.    Linda Villanueva Linda Villanueva leave closed incisions open to air if it is dry.   Linda Villanueva Linda Villanueva cover the incision with clean gauze & replace it after your daily shower for comfort.  TEGADERM:  Linda Villanueva have clear gauze band-aid dressings over your closed incision(s).  Remove the dressings 3 days after surgery. = 21 May 2022    If Linda Villanueva have an open wound with a wound vac, see wound vac care instructions.    ACTIVITIES as tolerated Start light daily activities --- self-care, walking, climbing stairs-- beginning the day after surgery.  Gradually increase activities as tolerated.  Control your pain to be active.  Stop when Linda Villanueva are tired.  Ideally, walk several times a day, eventually an hour a day.   Most people are back to most day-to-day activities in a few weeks.  It takes 4-8 weeks to get back to unrestricted, intense activity. If Linda Villanueva can walk 30 minutes without difficulty, it is safe to try more intense activity such as jogging, treadmill, bicycling, low-impact aerobics, swimming, etc. Save the most intensive and strenuous activity for last (Usually 4-8 weeks after surgery) such as sit-ups, heavy lifting, contact sports, etc.  Refrain from any intense heavy lifting or straining until Linda Villanueva are off narcotics for pain control.  Linda Villanueva will  have off days, but things should improve week-by-week. DO NOT PUSH THROUGH PAIN.  Let pain be your guide: If it hurts to do something, don't do it.  Pain is your body warning Linda Villanueva to avoid that activity for another week until the pain goes down. Linda Villanueva Linda Villanueva drive when Linda Villanueva are no longer taking narcotic prescription pain medication, Linda Villanueva can comfortably wear a seatbelt, and Linda Villanueva can safely make sudden turns/stops to protect yourself without hesitating due to  pain. Linda Villanueva Linda Villanueva have sexual intercourse when it is comfortable. If it hurts to do something, stop.  MEDICATIONS Take your usually prescribed home medications unless otherwise directed.   Blood thinners:  Usually Linda Villanueva can restart any strong blood thinners after the second postoperative day.  It is OK to take aspirin right away.     If Linda Villanueva are on strong blood thinners (warfarin/Coumadin, Plavix, Xerelto, Eliquis, Pradaxa, etc), discuss with your surgeon, medicine PCP, and/or cardiologist for instructions on when to restart the blood thinner & if blood monitoring is needed (PT/INR blood check, etc).     PAIN CONTROL Pain after surgery or related to activity is often due to strain/injury to muscle, tendon, nerves and/or incisions.  This pain is usually short-term and will improve in a few months.  To help speed the process of healing and to get back to regular activity more quickly, DO THE FOLLOWING THINGS TOGETHER: Increase activity gradually.  DO NOT PUSH THROUGH PAIN Use Ice and/or Heat Try Gentle Massage and/or Stretching Take over the counter pain medication Take Narcotic prescription pain medication for more severe pain  Good pain control = faster recovery.  It is better to take more medicine to be more active than to stay in bed all day to avoid medications.  Increase activity gradually Avoid heavy lifting at first, then increase to lifting as tolerated over the next 6 weeks. Do not "push through" the pain.  Listen to your body and avoid positions and maneuvers than reproduce the pain.  Wait a few days before trying something more intense Walking an hour a day is encouraged to help your body recover faster and more safely.  Start slowly and stop when getting sore.  If Linda Villanueva can walk 30 minutes without stopping or pain, Linda Villanueva can try more intense activity (running, jogging, aerobics, cycling, swimming, treadmill, sex, sports, weightlifting, etc.) Remember: If it hurts to do it, then don't do  it! Use Ice and/or Heat Linda Villanueva will have swelling and bruising around the incisions.  This will take several weeks to resolve. Ice packs or heating pads (6-8 times a day, 30-60 minutes at a time) will help sooth soreness & bruising. Some people prefer to use ice alone, heat alone, or alternate between ice & heat.  Experiment and see what works best for Linda Villanueva.  Consider trying ice for the first few days to help decrease swelling and bruising; then, switch to heat to help relax sore spots and speed recovery. Shower every day.  Short baths are fine.  It feels good!  Keep the incisions and wounds clean with soap & water.   Try Gentle Massage and/or Stretching Massage at the area of pain many times a day Stop if Linda Villanueva feel pain - do not overdo it Take over the counter pain medication This helps the muscle and nerve tissues become less irritable and calm down faster Choose ONE of the following over-the-counter anti-inflammatory medications: Acetaminophen '500mg'$  tabs (Tylenol) 1-2 pills with every meal and just before bedtime (avoid if Linda Villanueva  have liver problems or if Linda Villanueva have acetaminophen in Linda Villanueva narcotic prescription) Naproxen '220mg'$  tabs (ex. Aleve, Naprosyn) 1-2 pills twice a day (avoid if Linda Villanueva have kidney, stomach, IBD, or bleeding problems) Ibuprofen '200mg'$  tabs (ex. Advil, Motrin) 3-4 pills with every meal and just before bedtime (avoid if Linda Villanueva have kidney, stomach, IBD, or bleeding problems) Take with food/snack several times a day as directed for at least 2 weeks to help keep pain / soreness down & more manageable. Take Narcotic prescription pain medication for more severe pain A prescription for strong pain control is often given to Linda Villanueva upon discharge (for example: oxycodone/Percocet, hydrocodone/Norco/Vicodin, or tramadol/Ultram) Take your pain medication as prescribed. Be mindful that most narcotic prescriptions contain Tylenol (acetaminophen) as well - avoid taking too much Tylenol. If Linda Villanueva are having  problems/concerns with the prescription medicine (does not control pain, nausea, vomiting, rash, itching, etc.), please call us [phone] to see if Linda Villanueva need to switch Linda Villanueva to a different pain medicine that will work better for Linda Villanueva and/or control your side effects better. If Linda Villanueva need a refill on your pain medication, Linda Villanueva must call the office before 4 pm and on weekdays only.  By federal law, prescriptions for narcotics cannot be called into a pharmacy.  They must be filled out on paper & picked up from our office by the patient or authorized caretaker.  Prescriptions cannot be filled after 4 pm nor on weekends.    WHEN TO CALL us [phone] Severe uncontrolled or worsening pain  Fever over 101 F (38.5 C) Concerns with the incision: Worsening pain, redness, rash/hives, swelling, bleeding, or drainage Reactions / problems with new medications (itching, rash, hives, nausea, etc.) Nausea and/or vomiting Difficulty urinating Difficulty breathing Worsening fatigue, dizziness, lightheadedness, blurred vision Other concerns If Linda Villanueva are not getting better after two weeks or are noticing Linda Villanueva are getting worse, contact our office [phone] for further advice.  Linda Villanueva Linda Villanueva need to adjust your medications, re-evaluate Linda Villanueva in the office, send Linda Villanueva to the emergency room, or see what other things Linda Villanueva can do to help. The clinic staff is available to answer your questions during regular business hours (8:30am-5pm).  Please don't hesitate to call and ask to speak to one of our nurses for clinical concerns.    A surgeon from [facility] is always on call at the hospitals 24 hours/day If Linda Villanueva have a medical emergency, go to the nearest emergency room or call 911.  FOLLOW UP in our office One the day of your discharge from the hospital (or the next business weekday), please call [facility] to set up or confirm an appointment to see your surgeon in the office for a follow-up appointment.   Usually it is 2-3 weeks after your surgery.   If Linda Villanueva have skin staples at your incision(s), let the office know so Linda Villanueva can set up a time in the office for the nurse to remove them (usually around 10 days after surgery). Make sure that Linda Villanueva call for appointments the day of discharge (or the next business weekday) from the hospital to ensure a convenient appointment time. IF Linda Villanueva HAVE DISABILITY OR FAMILY LEAVE FORMS, BRING THEM TO THE OFFICE FOR PROCESSING.  DO NOT GIVE THEM TO YOUR DOCTOR.  [facility] [address] [place], [place], [place]  [address] ? [phone] - Main [phone] - [place],  [phone] - Fax [email or web address]    GETTING TO GOOD BOWEL HEALTH. It  is expected for your digestive tract to need a few months to get back to normal.  It is common for your bowel movements and stools to be irregular.  Linda Villanueva will have occasional bloating and cramping that should eventually fade away.  Until Linda Villanueva are eating solid food normally, off all pain medications, and back to regular activities; your bowels will not be normal.   Avoiding constipation The goal: ONE SOFT BOWEL MOVEMENT A DAY!    Drink plenty of fluids.  Choose water first. TAKE A FIBER SUPPLEMENT EVERY DAY THE REST OF YOUR LIFE During your first week back home, gradually add back a fiber supplement every day Experiment which form Linda Villanueva can tolerate.   There are many forms such as powders, tablets, wafers, gummies, etc Psyllium bran (Metamucil), methylcellulose (Citrucel), Miralax or Glycolax, Benefiber, Flax Seed.  Adjust the dose week-by-week (1/2 dose/day to 6 doses a day) until Linda Villanueva are moving your bowels 1-2 times a day.  Cut back the dose or try a different fiber product if it is giving Linda Villanueva problems such as diarrhea or bloating. Sometimes a laxative is needed to help jump-start bowels if constipated until the fiber supplement can help regulate your bowels.  If Linda Villanueva are tolerating eating  & Linda Villanueva are farting, it is okay to try a gentle laxative such as double dose MiraLax, prune juice, or Milk of Magnesia.  Avoid using laxatives too often. Stool softeners can sometimes help counteract the constipating effects of narcotic pain medicines.  It can also cause diarrhea, so avoid using for too long. If Linda Villanueva are still constipated despite taking fiber daily, eating solids, and a few doses of laxatives, call our office. Controlling diarrhea Try drinking liquids and eating bland foods for a few days to avoid stressing your intestines further. Avoid dairy products (especially milk & ice cream) for a short time.  The intestines often can lose the ability to digest lactose when stressed. Avoid foods that cause gassiness or bloating.  Typical foods include beans and other legumes, cabbage, broccoli, and dairy foods.  Avoid greasy, spicy, fast foods.  Every person has some sensitivity to other foods, so listen to your body and avoid those foods that trigger problems for Linda Villanueva. Probiotics (such as active yogurt, Align, etc) Linda Villanueva help repopulate the intestines and colon with normal bacteria and calm down a sensitive digestive tract Adding a fiber supplement gradually can help thicken stools by absorbing excess fluid and retrain the intestines to act more normally.  Slowly increase the dose over a few weeks.  Too much fiber too soon can backfire and cause cramping & bloating. It is okay to try and slow down diarrhea with a few doses of antidiarrheal medicines.   Bismuth subsalicylate (ex. Kayopectate, Pepto Bismol) for a few doses can help control diarrhea.  Avoid if pregnant.   Loperamide (Imodium) can slow down diarrhea.  Start with one tablet ('2mg'$ ) first.  Avoid if Linda Villanueva are having fevers or severe pain.  ILEOSTOMY PATIENTS WILL HAVE CHRONIC DIARRHEA since their colon is not in use.    Drink plenty of liquids.  Linda Villanueva will need to drink even more glasses of water/liquid a day to avoid getting dehydrated. Record  output from your ileostomy.  Expect to empty the bag every 3-4 hours at first.  Most people with a permanent ileostomy empty their bag 4-6 times at the least.   Use antidiarrheal medicine (especially Imodium) several times a day to avoid getting dehydrated.  Start with a dose at  bedtime & breakfast.  Adjust up or down as needed.  Increase antidiarrheal medications as directed to avoid emptying the bag more than 8 times a day (every 3 hours). Work with your wound ostomy nurse to learn care for your ostomy.  See ostomy care instructions. TROUBLESHOOTING IRREGULAR BOWELS 1) Start with a soft & bland diet. No spicy, greasy, or fried foods.  2) Avoid gluten/wheat or dairy products from diet to see if symptoms improve. 3) Miralax 17gm or flax seed mixed in [place]. water or juice-daily. Linda Villanueva use 2-4 times a day as needed. 4) Gas-X, Phazyme, etc. as needed for gas & bloating.  5) Prilosec (omeprazole) over-the-counter as needed 6)  Consider probiotics (Align, Activa, etc) to help calm the bowels down  Call your doctor if Linda Villanueva are getting worse or not getting better.  Sometimes further testing (cultures, endoscopy, X-ray studies, CT scans, bloodwork, etc.) Linda Villanueva be needed to help diagnose and treat the cause of the diarrhea. [facility], [place], [place], [place], [place]  [address] [phone] - Main.    [phone]  - Toll Free.   [phone] - Fax [email or web address]

## 2022-05-18 NOTE — Progress Notes (Signed)
Progress Note: General Surgery Service   Chief Complaint/Subjective: Feeling better this morning.  Objective: Vital signs in last 24 hours: Temp:  [98 F (36.7 C)-98.4 F (36.9 C)] 98 F (36.7 C) (07/01 0618) Pulse Rate:  [58-87] 63 (07/01 0618) Resp:  [14-23] 16 (07/01 0618) BP: (99-138)/(66-78) 124/72 (07/01 0618) SpO2:  [98 %-100 %] 100 % (07/01 0618) Weight:  [75.9 kg] 75.9 kg (07/01 0500) Last BM Date : 05/16/22  Intake/Output from previous day: 06/30 0701 - 07/01 0700 In: 3678.1 [P.O.:1200; I.V.:2478.1] Out: 0  Intake/Output this shift: No intake/output data recorded.  Constitutional: NAD; conversant; no deformities Eyes: Moist conjunctiva; no lid lag; anicteric; PERRL Neck: Trachea midline; no thyromegaly Lungs: Normal respiratory effort; no tactile fremitus CV: RRR; no palpable thrills; no pitting edema GI: Abd Soft, nontender; no palpable hepatosplenomegaly MSK: Normal range of motion of extremities; no clubbing/cyanosis Psychiatric: Appropriate affect; alert and oriented x3 Lymphatic: No palpable cervical or axillary lymphadenopathy  Lab Results: CBC  Recent Labs    05/16/22 1654 05/17/22 0514  WBC 8.6 8.7  HGB 13.7 12.1  HCT 42.3 37.3  PLT 337 283   BMET Recent Labs    05/16/22 1654 05/17/22 0514  NA 141 142  K 4.1 3.5  CL 107 114*  CO2 25 25  GLUCOSE 120* 101*  BUN 10 6  CREATININE 0.51 0.61  CALCIUM 9.2 8.2*   PT/INR No results for input(s): "LABPROT", "INR" in the last 72 hours. ABG No results for input(s): "PHART", "HCO3" in the last 72 hours.  Invalid input(s): "PCO2", "PO2"  Anti-infectives: Anti-infectives (From admission, onward)    None       Medications: Scheduled Meds:  enoxaparin (LOVENOX) injection  40 mg Subcutaneous Q24H   sodium chloride flush  3 mL Intravenous Q12H   Continuous Infusions:  sodium chloride 125 mL/hr at 05/18/22 0150   PRN Meds:.acetaminophen **OR** acetaminophen  Assessment/Plan: Linda Villanueva is a 51 year old female who presented with a partial bowel obstruction.  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  Laparoscopic or open exploration is recommended for the patient with small bowel resection and possible liver biopsy to treat the obstruction and obtain tissue for pathologic diagnosis.   I discussed this with the patient as well as the patient's husband on the phone.  I will discuss the case with Dr. Johney Maine who is covering the surgery service today.  Depending on OR availability surgery Linda Villanueva be today, tomorrow or Monday.   LOS: 1 day   [name], [facility] Use [email or web address] to contact on call provider

## 2022-05-18 NOTE — Anesthesia Postprocedure Evaluation (Signed)
Anesthesia Post Note  Patient: Linda Villanueva [place]  Procedure(s) Performed: EXPLORATORY LAPAROTOMY; SMALL BOWEL RESECTION; WEDGE LIVER BIOPSY (Abdomen)     Patient location during evaluation: PACU Anesthesia Type: General Level of consciousness: sedated and patient cooperative Pain management: pain level controlled Vital Signs Assessment: post-procedure vital signs reviewed and stable Respiratory status: spontaneous breathing Cardiovascular status: stable Anesthetic complications: no   No notable events documented.  Last Vitals:  Vitals:   05/18/22 1810 05/18/22 1931  BP: (!) 152/87 121/65  Pulse: 71 77  Resp: 18 16  Temp: 36.7 C (!) 36.4 C  SpO2: 100% 99%    Last Pain:  Vitals:   05/18/22 1931  TempSrc: Oral  PainSc:                  Nolon Nations

## 2022-05-18 NOTE — H&P (View-Only) (Signed)
Progress Note: General Surgery Service   Chief Complaint/Subjective: Feeling better this morning.  Objective: Vital signs in last 24 hours: Temp:  [98 F (36.7 C)-98.4 F (36.9 C)] 98 F (36.7 C) (07/01 0618) Pulse Rate:  [58-87] 63 (07/01 0618) Resp:  [14-23] 16 (07/01 0618) BP: (99-138)/(66-78) 124/72 (07/01 0618) SpO2:  [98 %-100 %] 100 % (07/01 0618) Weight:  [75.9 kg] 75.9 kg (07/01 0500) Last BM Date : 05/16/22  Intake/Output from previous day: 06/30 0701 - 07/01 0700 In: 3678.1 [P.O.:1200; I.V.:2478.1] Out: 0  Intake/Output this shift: No intake/output data recorded.  Constitutional: NAD; conversant; no deformities Eyes: Moist conjunctiva; no lid lag; anicteric; PERRL Neck: Trachea midline; no thyromegaly Lungs: Normal respiratory effort; no tactile fremitus CV: RRR; no palpable thrills; no pitting edema GI: Abd Soft, nontender; no palpable hepatosplenomegaly MSK: Normal range of motion of extremities; no clubbing/cyanosis Psychiatric: Appropriate affect; alert and oriented x3 Lymphatic: No palpable cervical or axillary lymphadenopathy  Lab Results: CBC  Recent Labs    05/16/22 1654 05/17/22 0514  WBC 8.6 8.7  HGB 13.7 12.1  HCT 42.3 37.3  PLT 337 283   BMET Recent Labs    05/16/22 1654 05/17/22 0514  NA 141 142  K 4.1 3.5  CL 107 114*  CO2 25 25  GLUCOSE 120* 101*  BUN 10 6  CREATININE 0.51 0.61  CALCIUM 9.2 8.2*   PT/INR No results for input(s): "LABPROT", "INR" in the last 72 hours. ABG No results for input(s): "PHART", "HCO3" in the last 72 hours.  Invalid input(s): "PCO2", "PO2"  Anti-infectives: Anti-infectives (From admission, onward)    None       Medications: Scheduled Meds:  enoxaparin (LOVENOX) injection  40 mg Subcutaneous Q24H   sodium chloride flush  3 mL Intravenous Q12H   Continuous Infusions:  sodium chloride 125 mL/hr at 05/18/22 0150   PRN Meds:.acetaminophen **OR** acetaminophen  Assessment/Plan: [name] is a 51 year old female who presented with a partial bowel obstruction.  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  Laparoscopic or open exploration is recommended for the patient with small bowel resection and possible liver biopsy to treat the obstruction and obtain tissue for pathologic diagnosis.   I discussed this with the patient as well as the patient's husband on the phone.  I will discuss the case with Dr. Johney Maine who is covering the surgery service today.  Depending on OR availability surgery [place] be today, tomorrow or Monday.   LOS: 1 day   Linda Villanueva, [facility] Use [email or web address] to contact on call provider

## 2022-05-18 NOTE — Anesthesia Preprocedure Evaluation (Signed)
Anesthesia Evaluation    Reviewed: Allergy & Precautions, Patient's Chart, lab work & pertinent test results, Unable to perform ROS - Chart review only  Airway Mallampati: II  TM Distance: >3 FB Neck ROM: Full    Dental no notable dental hx.    Pulmonary neg pulmonary ROS,    Pulmonary exam normal breath sounds clear to auscultation       Cardiovascular negative cardio ROS Normal cardiovascular exam Rhythm:Regular Rate:Normal     Neuro/Psych negative neurological ROS     GI/Hepatic negative GI ROS, Neg liver ROS,   Endo/Other  Hyperthyroidism   Renal/GU negative Renal ROS     Musculoskeletal negative musculoskeletal ROS (+)   Abdominal   Peds  Hematology negative hematology ROS (+)   Anesthesia Other Findings   Reproductive/Obstetrics                            Anesthesia Physical Anesthesia Plan  ASA: 2 and emergent  Anesthesia Plan: General   Post-op Pain Management: Ofirmev IV (intra-op)* and Lidocaine infusion*   Induction: Intravenous  PONV Risk Score and Plan: 4 or greater and Ondansetron, Dexamethasone, Treatment Konnie vary due to age or medical condition and Midazolam  Airway Management Planned: Oral ETT  Additional Equipment:   Intra-op Plan:   Post-operative Plan: Extubation in OR  Informed Consent: I have reviewed the patients History and Physical, chart, labs and discussed the procedure including the risks, benefits and alternatives for the proposed anesthesia with the patient or authorized representative who has indicated his/her understanding and acceptance.     Dental advisory given  Plan Discussed with: CRNA  Anesthesia Plan Comments:         Anesthesia Quick Evaluation

## 2022-05-18 NOTE — Op Note (Signed)
05/16/2022 - 05/18/2022  2:41 PM  PATIENT:  Linda Villanueva  51 y.o. 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  See anesthesia record  Delay start of Pharmacological VTE agent (>24hrs) due to surgical blood loss or risk of bleeding:  no  DRAINS: none   SPECIMEN:   Distal jejunum with partial obstructing mass and lymphadenopathy Right hepatic lobe liver edge wedge containing liver masses suspicious for metastatic carcinoid.  DISPOSITION OF SPECIMEN:  PATHOLOGY  COUNTS:  YES  PLAN OF CARE: Admit to inpatient   PATIENT DISPOSITION:  PACU - hemodynamically stable.  INDICATION: Patient originally from [place] with history of appendectomy.  Worsening abdominal pain.  Came to the emergency room.  CT scan showing evidence of small bowel obstruction with transition and a small intestinal mass with mesenteric nodularity.  Liver masses.  Suspicious for carcinoid with metastasis.  Admitted rehydrated.  Surgical consultation.  Recommendation made for abdominal exploration with resection of obstructing bowel mass and liver biopsy for work-up and assessment.  OR FINDINGS: 15 mm small bowel nodule at junction of jejunum and ileum causing partial obstruction.  Moderately bulky lymphadenopathy in the mesentery proximal to the nodule.  En bloc resection done.  230 cm of small intestine remaining from ligament of Treitz to ileocecal valve.  Numerous miliary 41m-60 mm liver nodules.  Wedge resection  done of right hepatic lobe just lateral to the gallbladder hepatic fossa.  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  CASE DATA:  Type of patient?: LDOW CASE (Surgical Hospitalist [facility] Inpatient)  Status of Case? URGENT Add On  Infection Present At Time Of Surgery (PATOS)?  NO  DESCRIPTION: Informed consent was confirmed.   The patient received IV antibiotics and underwent general anesthesia without any difficulty. The patient was positioned appropriately. Foley catheter had been sterilely placed. SCDs were active during the entire case.  The abdomen was prepped and draped in a sterile fashion.  Surgical timeout confirmed our plan.  Entry was gained through a midline incision since small bowel resection anticipated.  Did exploration.  No massive carcinomatosis noted.  Small bowel eviscerated.  Good feel lymphadenopathy in the small bowel mesentery somewhat proximally.  Ran the small bowel and found a subtle but definite nodule in the distal jejunum causing partial obstruction.  I could palpate numerous nodules on the liver.  Most felt 0.5 to 2 cm in size.  Larger 1 noted in segment 4 just medial to the gallbladder.   Proceeded with resection given the patient's partial obstructive symptoms and presentation.  I decided to transect the small bowel mesentery approximately just proximal to the obvious mesenteric masses consistent with lymph nodes.  Did this initially with cautery on the visceral peritoneum and then transection with clamps and 0 silk ties at the base and bipolar vessel sealer.  Did radial resection.  Ended up having to take 65 cm to have healthy margins proximally and distally.  Did a side-to-side 75 mm antiperistaltic classic anastomosis.  Transected the common defect  with a TX 90 stapler.  I closed the mesentery in an inverted T fashion at the base and then more transversely to have the redundant mesentery cover and protect the 90 staple line and keep it open.   Antitension silk sutures are is done x2 at the crotch.  Hemostasis good.  Viability remaining small bowel looked good.  Measured small bowel.  Remaining length 230 cm.  Next I turned tension to the liver.  I chose an area with a few 5-10 mm nodules on the edge of the right hepatic lobe just lateral to the gallbladder fossa.  Did a wedge resection using the vessel sealer.  Had some bleeding at its base then controlled with cautery and fibrillar to good result.    We reinspected the abdomen.  Ran the small bowel from the ileocecal valve leukotrienes and allowed everything to fall back in place.  Small bowel viable.  Anastomosis healthy.  Omentum positioned most abdomen but the right lateral tongue was placed over the wedge resection of the liver.  Gloves changed.  Midline wound closed with 0 PDS in a running fashion at the fascia and 4 Monocryl at the skin.  Sterile dressing applied.  Patient being extubated to recovery room.  I discussed operative findings, updated the patient's status, discussed probable steps to recovery, and gave postoperative recommendations to the patient's spouse, Vita Erm.   Recommendations were made.  Questions were answered.  He expressed understanding & appreciation.    Adin Hector, M.D., F.A.C.S. Gastrointestinal and Minimally Invasive Surgery [facility] [address], [place], [place] [address] [phone] Main / Paging

## 2022-05-18 NOTE — Progress Notes (Signed)
[facility] PROGRESS NOTE    Progress Note  Linda Villanueva  PJA:[id number] DOB: 1971-08-01 DOA: 05/16/2022 PCP: Linda Ruddy, MD     Brief Narrative:   Linda Villanueva is an 51 y.o. female past medical history significant for hypothyroidism hyperlipidemia comes in with abdominal pain nausea vomiting and diarrhea that started 3 days prior to admission.  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    Assessment/Plan:   SBO (small bowel obstruction) ([place]) concerned about carcinoid tumor with liver mets CT of the abdomen and pelvis 3 cm mesenteric mass and multiple enhancing liver masses concerning for metastatic disease with focal segmental hyperenhancing small bowel. General surgery was consulted recommended laparoscopic laparotomy with bowel resection and possible liver biopsy. Currently n.p.o. diet per surgery.  Mesenteric mass: Noted on CT concerned about carcinoid for surgical intervention hopefully pathology could be obtained and sent for review.  Hypothyroidism: Continue Synthroid.  Hyperlipidemia: On no medications.    DVT prophylaxis: lovenox Family Communication:none Status is: Observation The patient will require care spanning > 2 midnights and should be moved to inpatient because: Small bowel partial obstruction    Code Status:     Code Status Orders  (From admission, onward)           Start     Ordered   05/16/22 2151  Full code  Continuous        05/16/22 2152           Code Status History     This patient has a current code status but no historical code status.         IV Access:   Peripheral IV   Procedures and diagnostic studies:   CT CHEST WO CONTRAST  Result Date: 05/17/2022 CLINICAL DATA:  Colon cancer, metastatic, staging EXAM: CT CHEST WITHOUT CONTRAST TECHNIQUE: Multidetector CT imaging of  the chest was performed following the standard protocol without IV contrast. RADIATION DOSE REDUCTION: This exam was performed according to the departmental dose-optimization program which includes automated exposure control, adjustment of the mA and/or kV according to patient size and/or use of iterative reconstruction technique. COMPARISON:  None Available. FINDINGS: Cardiovascular: Normal cardiac size. No pericardial disease. Normal size main and branch pulmonary arteries. The thoracic aorta is unremarkable. Mediastinum/Nodes: No lymphadenopathy. The thyroid is unremarkable. The esophagus is unremarkable. Lungs/Pleura: There is a solid 3 mm right upper lobe pulmonary nodule (series 4, image 22). No focal airspace disease. No pleural effusion. No pneumothorax. Upper Abdomen: Multiple liver masses best seen on contrast enhanced CT of the abdomen pelvis acquired yesterday. Musculoskeletal: No acute osseous abnormality. No suspicious osseous lesion. IMPRESSION: Indeterminate solid 3 mm right upper lobe pulmonary nodule. Multiple liver masses best seen on recent contrast enhanced CT of the abdomen and pelvis. Electronically Signed   By: Linda Villanueva M.D.   On: 05/17/2022 08:15   CT ABDOMEN PELVIS W CONTRAST  Result Date: 05/16/2022 CLINICAL DATA:  Upper abdominal pain with nausea vomiting EXAM: CT ABDOMEN AND PELVIS WITH CONTRAST TECHNIQUE: Multidetector CT imaging of the abdomen and pelvis was performed using the standard protocol following bolus administration of intravenous contrast. RADIATION DOSE REDUCTION: This exam was performed according to the departmental dose-optimization program which includes automated exposure control, adjustment of the mA and/or kV according to patient size and/or use of iterative reconstruction technique. CONTRAST:  128m OMNIPAQUE IOHEXOL 300 MG/ML  SOLN  COMPARISON:  None Available. FINDINGS: Lower chest: Lung bases demonstrate no acute consolidation or effusion. Hepatobiliary:  Multiple hyperenhancing liver masses are visualized. The largest is seen within the left hepatic lobe and measures approximately 4.7 cm and demonstrates central low density. No calcified gallstone. Mildly prominent common bile duct up to 7 mm. Pancreas: Unremarkable. No pancreatic ductal dilatation or surrounding inflammatory changes. Spleen: Normal in size without focal abnormality. Adrenals/Urinary Tract: Adrenal glands are unremarkable. Kidneys are normal, without renal calculi, focal lesion, or hydronephrosis. Bladder is unremarkable. Stomach/Bowel: The stomach is nonenlarged. Negative appendix. Fluid-filled mildly dilated mid to distal small bowel with some fecalized segments but no well-defined transition point. Some mesenteric vascular congestion. No intramural air or bowel wall thickening. Negative appendix. Focal area of hyperenhancing small bowel, series 2, image 41, coronal series 4, image 35. Vascular/Lymphatic: Nonaneurysmal aorta.  No suspicious lymph nodes. Reproductive: Uterus and bilateral adnexa are unremarkable. Other: No free air. Small moderate volume free fluid. Avidly enhancing central mesenteric mass measuring 2.9 by 2 cm, series 2, image 47 Musculoskeletal: No acute or significant osseous findings. IMPRESSION: 1. Multiple mildly dilated fluid-filled loops of mid small bowel, suspicious for partial small bowel obstruction, poorly defined transition point. Some mesenteric vascular congestion but no intramural air or bowel wall thickening. 2. 2.9 cm enhancing central mesenteric mass. 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 Focal segment of hyperenhancing small bowel could be potential source/site of primary. 3. Small moderate volume free fluid in the pelvis 4. Mildly prominent common bile duct which Linda Villanueva be correlated with LFTs. Electronically Signed   By: Donavan Foil M.D.   On: 05/16/2022 19:45     Medical  Consultants:   None.   Subjective:    Linda Villanueva no pain.  Objective:    Vitals:   05/18/22 0125 05/18/22 0500 05/18/22 0618 05/18/22 0945  BP: 113/67  124/72 118/68  Pulse: (!) 58  63 65  Resp: '16  16 18  '$ Temp: 98.4 F (36.9 C)  98 F (36.7 C) 98.6 F (37 C)  TempSrc: Oral  Oral Oral  SpO2: 100%  100% 100%  Weight:  75.9 kg    Height:       SpO2: 100 %   Intake/Output Summary (Last 24 hours) at 05/18/2022 0953 Last data filed at 05/18/2022 0600 Gross per 24 hour  Intake 3678.13 ml  Output 0 ml  Net 3678.13 ml   Filed Weights   05/16/22 1610 05/18/22 0500  Weight: 78 kg 75.9 kg    Exam: General exam: In no acute distress. Respiratory system: Good air movement and clear to auscultation. Cardiovascular system: S1 & S2 heard, RRR. No JVD. Gastrointestinal system: Abdomen is nondistended, soft and nontender.   Extremities: No pedal edema. Skin: No rashes, lesions or ulcers Psychiatry: Judgement and insight appear normal. Mood & affect appropriate.   Data Reviewed:    Labs: Basic Metabolic Panel: Recent Labs  Lab 05/16/22 1654 05/17/22 0514  NA 141 142  K 4.1 3.5  CL 107 114*  CO2 25 25  GLUCOSE 120* 101*  BUN 10 6  CREATININE 0.51 0.61  CALCIUM 9.2 8.2*    GFR Estimated Creatinine Clearance: 90.4 mL/min (by C-G formula based on SCr of 0.61 mg/dL). Liver Function Tests: Recent Labs  Lab 05/16/22 1654 05/17/22 0514  AST 21 16  ALT 15 14  ALKPHOS 78 64  BILITOT 0.7 0.7  PROT 8.1 6.4*  ALBUMIN 4.1 3.2*  Recent Labs  Lab 05/16/22 1654  LIPASE 28    No results for input(s): "AMMONIA" in the last 168 hours. Coagulation profile No results for input(s): "INR", "PROTIME" in the last 168 hours. COVID-19 Labs  No results for input(s): "DDIMER", "FERRITIN", "LDH", "CRP" in the last 72 hours.  Lab Results  Component Value Date   [place] Not Detected 10/27/2019    CBC: Recent Labs  Lab 05/16/22 1654 05/17/22 0514  WBC  8.6 8.7  NEUTROABS 5.7  --   HGB 13.7 12.1  HCT 42.3 37.3  MCV 86.3 88.2  PLT 337 283    Cardiac Enzymes: No results for input(s): "CKTOTAL", "CKMB", "CKMBINDEX", "TROPONINI" in the last 168 hours. BNP (last 3 results) No results for input(s): "PROBNP" in the last 8760 hours. CBG: No results for input(s): "GLUCAP" in the last 168 hours. D-Dimer: No results for input(s): "DDIMER" in the last 72 hours. Hgb A1c: No results for input(s): "HGBA1C" in the last 72 hours. Lipid Profile: No results for input(s): "CHOL", "HDL", "LDLCALC", "TRIG", "CHOLHDL", "LDLDIRECT" in the last 72 hours. Thyroid function studies: No results for input(s): "TSH", "T4TOTAL", "T3FREE", "THYROIDAB" in the last 72 hours.  Invalid input(s): "FREET3" Anemia work up: No results for input(s): "VITAMINB12", "FOLATE", "FERRITIN", "TIBC", "IRON", "RETICCTPCT" in the last 72 hours. Sepsis Labs: Recent Labs  Lab 05/16/22 1654 05/17/22 0514  WBC 8.6 8.7    Microbiology No results found for this or any previous visit (from the past 240 hour(s)).   Medications:    enoxaparin (LOVENOX) injection  40 mg Subcutaneous Q24H   sodium chloride flush  3 mL Intravenous Q12H   Continuous Infusions:  sodium chloride 125 mL/hr at 05/18/22 0150      LOS: 1 day   Charlynne Cousins  [facility]  05/18/2022, 9:53 AM

## 2022-05-18 NOTE — Anesthesia Procedure Notes (Signed)
Procedure Name: Intubation Date/Time: 05/18/2022 12:56 PM  Performed by: Raenette Rover, CRNAPre-anesthesia Checklist: Patient identified, Emergency Drugs available, Suction available and Patient being monitored Patient Re-evaluated:Patient Re-evaluated prior to induction Oxygen Delivery Method: Circle system utilized Preoxygenation: Pre-oxygenation with 100% oxygen Induction Type: IV induction, Rapid sequence and Cricoid Pressure applied Laryngoscope Size: Mac and 3 Grade View: Grade I Tube type: Oral Tube size: 7.0 mm Number of attempts: 1 Airway Equipment and Method: Stylet Placement Confirmation: ETT inserted through vocal cords under direct vision, positive ETCO2 and breath sounds checked- equal and bilateral Secured at: 21 cm Tube secured with: Tape Dental Injury: Teeth and Oropharynx as per pre-operative assessment

## 2022-05-18 NOTE — Transfer of Care (Signed)
Immediate Anesthesia Transfer of Care Note  Patient: Linda Villanueva  Procedure(s) Performed: EXPLORATORY LAPAROTOMY; SMALL BOWEL RESECTION; WEDGE LIVER BIOPSY (Abdomen)  Patient Location: PACU  Anesthesia Type:General  Level of Consciousness: drowsy and patient cooperative  Airway & Oxygen Therapy: Patient Spontanous Breathing and Patient connected to face mask oxygen  Post-op Assessment: Report given to RN and Post -op Vital signs reviewed and stable  Post vital signs: Reviewed and stable  Last Vitals:  Vitals Value Taken Time  BP 151/85 05/18/22 1447  Temp 36.4 C 05/18/22 1447  Pulse 79 05/18/22 1451  Resp 22 05/18/22 1451  SpO2 100 % 05/18/22 1451  Vitals shown include unvalidated device data.  Last Pain:  Vitals:   05/18/22 1447  TempSrc:   PainSc: Asleep      Patients Stated Pain Goal: 1 (43/60/16 5800)  Complications: No notable events documented.

## 2022-05-19 ENCOUNTER — Encounter (HOSPITAL_COMMUNITY): Payer: Self-pay | Admitting: Surgery

## 2022-05-19 DIAGNOSIS — E876 Hypokalemia: Secondary | ICD-10-CM

## 2022-05-19 DIAGNOSIS — K769 Liver disease, unspecified: Secondary | ICD-10-CM | POA: Diagnosis not present

## 2022-05-19 DIAGNOSIS — K56609 Unspecified intestinal obstruction, unspecified as to partial versus complete obstruction: Secondary | ICD-10-CM | POA: Diagnosis not present

## 2022-05-19 DIAGNOSIS — F418 Other specified anxiety disorders: Secondary | ICD-10-CM

## 2022-05-19 DIAGNOSIS — E0590 Thyrotoxicosis, unspecified without thyrotoxic crisis or storm: Secondary | ICD-10-CM | POA: Diagnosis not present

## 2022-05-19 DIAGNOSIS — K6389 Other specified diseases of intestine: Secondary | ICD-10-CM | POA: Diagnosis not present

## 2022-05-19 LAB — CBC
HCT: 33 % — ABNORMAL LOW (ref 36.0–46.0)
Hemoglobin: 10.7 g/dL — ABNORMAL LOW (ref 12.0–15.0)
MCH: 27.9 pg (ref 26.0–34.0)
MCHC: 32.4 g/dL (ref 30.0–36.0)
MCV: 86.2 fL (ref 80.0–100.0)
Platelets: 283 10*3/uL (ref 150–400)
RBC: 3.83 MIL/uL — ABNORMAL LOW (ref 3.87–5.11)
RDW: 14.1 % (ref 11.5–15.5)
WBC: 13.2 10*3/uL — ABNORMAL HIGH (ref 4.0–10.5)
nRBC: 0 % (ref 0.0–0.2)

## 2022-05-19 LAB — BASIC METABOLIC PANEL
Anion gap: 9 (ref 5–15)
BUN: 5 mg/dL — ABNORMAL LOW (ref 6–20)
CO2: 23 mmol/L (ref 22–32)
Calcium: 8 mg/dL — ABNORMAL LOW (ref 8.9–10.3)
Chloride: 103 mmol/L (ref 98–111)
Creatinine, Ser: 0.62 mg/dL (ref 0.44–1.00)
GFR, Estimated: >60 mL/min (ref >60)
Glucose, Bld: 125 mg/dL — ABNORMAL HIGH (ref 70–99)
Potassium: 2.9 mmol/L — ABNORMAL LOW (ref 3.5–5.1)
Sodium: 135 mmol/L (ref 135–145)

## 2022-05-19 LAB — MAGNESIUM: Magnesium: 1.9 mg/dL (ref 1.7–2.4)

## 2022-05-19 MED ORDER — ENSURE SURGERY PO LIQD
237.0000 mL | Freq: Two times a day (BID) | ORAL | Status: DC
  Administered 2022-05-21 – 2022-05-22 (×2): 237 mL via ORAL

## 2022-05-19 MED ORDER — POTASSIUM CHLORIDE CRYS ER 20 MEQ PO TBCR
40.0000 meq | EXTENDED_RELEASE_TABLET | Freq: Every day | ORAL | Status: AC
  Administered 2022-05-19 – 2022-05-20 (×2): 40 meq via ORAL
  Filled 2022-05-19 (×3): qty 2

## 2022-05-19 MED ORDER — MAGNESIUM SULFATE 2 GM/50ML IV SOLN
2.0000 g | Freq: Once | INTRAVENOUS | Status: AC
  Administered 2022-05-19: 2 g via INTRAVENOUS
  Filled 2022-05-19: qty 50

## 2022-05-19 MED ORDER — LACTATED RINGERS IV BOLUS: 1000.0000 mL | Freq: Three times a day (TID) | INTRAVENOUS | Status: AC | PRN

## 2022-05-19 MED ORDER — POTASSIUM CHLORIDE IN NACL 40-0.9 MEQ/L-% IV SOLN: INTRAVENOUS | Status: DC

## 2022-05-19 MED ORDER — ONDANSETRON HCL 4 MG PO TABS
4.0000 mg | ORAL_TABLET | Freq: Three times a day (TID) | ORAL | Status: AC
  Administered 2022-05-19 – 2022-05-20 (×4): 4 mg via ORAL
  Filled 2022-05-19 (×4): qty 1

## 2022-05-19 MED ORDER — ONDANSETRON HCL 4 MG PO TABS: 4.0000 mg | ORAL_TABLET | Freq: Three times a day (TID) | ORAL | Status: DC

## 2022-05-19 MED ORDER — GABAPENTIN 100 MG PO CAPS
100.0000 mg | ORAL_CAPSULE | Freq: Three times a day (TID) | ORAL | Status: DC
  Administered 2022-05-19 – 2022-05-23 (×13): 100 mg via ORAL
  Filled 2022-05-19 (×13): qty 1

## 2022-05-19 MED ORDER — LACTATED RINGERS IV BOLUS
1000.0000 mL | Freq: Once | INTRAVENOUS | Status: AC
  Administered 2022-05-19: 1000 mL via INTRAVENOUS

## 2022-05-19 MED ORDER — HYDROMORPHONE HCL 1 MG/ML IJ SOLN: 0.5000 mg | INTRAMUSCULAR | Status: DC | PRN

## 2022-05-19 MED ORDER — METHOCARBAMOL 1000 MG/10ML IJ SOLN
500.0000 mg | Freq: Three times a day (TID) | INTRAVENOUS | Status: DC
  Administered 2022-05-19 – 2022-05-21 (×6): 500 mg via INTRAVENOUS
  Filled 2022-05-19 (×6): qty 500
  Filled 2022-05-19: qty 5

## 2022-05-19 MED ORDER — POTASSIUM CHLORIDE IN NACL 40-0.9 MEQ/L-% IV SOLN
INTRAVENOUS | Status: DC
  Filled 2022-05-19: qty 1000

## 2022-05-19 MED ORDER — SODIUM CHLORIDE 0.9 % IV BOLUS
1000.0000 mL | Freq: Once | INTRAVENOUS | Status: AC
  Administered 2022-05-19: 1000 mL via INTRAVENOUS

## 2022-05-19 MED ORDER — POTASSIUM CHLORIDE IN NACL 40-0.9 MEQ/L-% IV SOLN
INTRAVENOUS | Status: AC
  Filled 2022-05-19 (×2): qty 1000

## 2022-05-19 NOTE — Progress Notes (Signed)
[facility] PROGRESS NOTE    Progress Note  Linda Villanueva  YQI:[id number] DOB: 1971/04/06 DOA: 05/16/2022 PCP: Billie Ruddy, MD     Brief Narrative:   Linda Villanueva is an 51 y.o. female past medical history significant for hypothyroidism hyperlipidemia comes in with abdominal pain nausea vomiting and diarrhea that started 3 days prior to admission.  CT scan of the abdomen shows small bowel obstruction and an enhancing mesenteric mass with liver metastases suspicious for carcinoid with small volume free fluid.CT of the abdomen and pelvis 3 cm mesenteric mass and multiple enhancing liver masses concerning for metastatic disease with focal segmental hyperenhancing small bowel.    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 Her diet has been advanced.  Orthostatic hypotension: She relates nicely dizziness upon standing. We will give her a bolus of normal saline continue IV fluids with potassium supplementation.  Leukocytosis: Likely reactive continue to monitor fever curve.  Normocytic anemia: Likely due to surgical intervention. Continue to monitor hemoglobin.  Hypokalemia: Changed her fluids to potassium supplements.  Hypothyroidism: On no medications at home.  Hyperlipidemia: On no medications.    DVT prophylaxis: lovenox Family Communication:none Status is: Inpatient The patient will require care spanning > 2 midnights and should be moved to inpatient because: Jejunal mass causing partial obstruction.    Code Status:     Code Status Orders  (From admission, onward)           Start     Ordered   05/16/22 2151  Full code  Continuous        05/16/22 2152           Code Status History     This patient has a current code status but no  historical code status.         IV Access:   Peripheral IV   Procedures and diagnostic studies:   CT CHEST WO CONTRAST  Result Date: 05/17/2022 CLINICAL DATA:  Colon cancer, metastatic, staging EXAM: CT CHEST WITHOUT CONTRAST TECHNIQUE: Multidetector CT imaging of the chest was performed following the standard protocol without IV contrast. RADIATION DOSE REDUCTION: This exam was performed according to the departmental dose-optimization program which includes automated exposure control, adjustment of the mA and/or kV according to patient size and/or use of iterative reconstruction technique. COMPARISON:  None Available. FINDINGS: Cardiovascular: Normal cardiac size. No pericardial disease. Normal size main and branch pulmonary arteries. The thoracic aorta is unremarkable. Mediastinum/Nodes: No lymphadenopathy. The thyroid is unremarkable. The esophagus is unremarkable. Lungs/Pleura: There is a solid 3 mm right upper lobe pulmonary nodule (series 4, image 22). No focal airspace disease. No pleural effusion. No pneumothorax. Upper Abdomen: Multiple liver masses best seen on contrast enhanced CT of the abdomen pelvis acquired yesterday. Musculoskeletal: No acute osseous abnormality. No suspicious osseous lesion. IMPRESSION: Indeterminate solid 3 mm right upper lobe pulmonary nodule. Multiple liver masses best seen on recent contrast enhanced CT of the abdomen and pelvis. Electronically Signed   By: Maurine Simmering M.D.   On: 05/17/2022 08:15     Medical Consultants:   None.   Subjective:    Linda Villanueva in pain, relates dizziness upon standing.  Objective:    Vitals:   05/18/22 2205 05/19/22 0155 05/19/22 0500 05/19/22 0613  BP: (!) 103/56 127/75  130/66  Pulse:  77 78  80  Resp: '17 16  16  '$ Temp: 98.3 F (36.8 C) 98.4 F (36.9 C)  98.5 F (36.9 C)  TempSrc: Oral Oral  Oral  SpO2: 94% 94%  98%  Weight:   75.9 kg   Height:       SpO2: 98 % O2 Flow Rate (L/min): 2  L/min   Intake/Output Summary (Last 24 hours) at 05/19/2022 0738 Last data filed at 05/19/2022 0500 Gross per 24 hour  Intake 3529.78 ml  Output 550 ml  Net 2979.78 ml    Filed Weights   05/16/22 1610 05/18/22 0500 05/19/22 0500  Weight: 78 kg 75.9 kg 75.9 kg    Exam: General exam: In no acute distress. Respiratory system: Good air movement and clear to auscultation. Cardiovascular system: S1 & S2 heard, RRR. No JVD. Gastrointestinal system: Abdomen is nondistended, soft and nontender.  Extremities: No pedal edema. Skin: No rashes, lesions or ulcers Psychiatry: Judgement and insight appear normal. Mood & affect appropriate.   Data Reviewed:    Labs: Basic Metabolic Panel: Recent Labs  Lab 05/16/22 1654 05/17/22 0514 05/19/22 0521  NA 141 142 135  K 4.1 3.5 2.9*  CL 107 114* 103  CO2 '25 25 23  '$ GLUCOSE 120* 101* 125*  BUN 10 6 5*  CREATININE 0.51 0.61 0.62  CALCIUM 9.2 8.2* 8.0*  MG  --   --  1.9    GFR Estimated Creatinine Clearance: 90.4 mL/min (by C-G formula based on SCr of 0.62 mg/dL). Liver Function Tests: Recent Labs  Lab 05/16/22 1654 05/17/22 0514  AST 21 16  ALT 15 14  ALKPHOS 78 64  BILITOT 0.7 0.7  PROT 8.1 6.4*  ALBUMIN 4.1 3.2*    Recent Labs  Lab 05/16/22 1654  LIPASE 28    No results for input(s): "AMMONIA" in the last 168 hours. Coagulation profile No results for input(s): "INR", "PROTIME" in the last 168 hours. COVID-19 Labs  No results for input(s): "DDIMER", "FERRITIN", "LDH", "CRP" in the last 72 hours.  Lab Results  Component Value Date   [place] Not Detected 10/27/2019    CBC: Recent Labs  Lab 05/16/22 1654 05/17/22 0514 05/19/22 0521  WBC 8.6 8.7 13.2*  NEUTROABS 5.7  --   --   HGB 13.7 12.1 10.7*  HCT 42.3 37.3 33.0*  MCV 86.3 88.2 86.2  PLT 337 283 283    Cardiac Enzymes: No results for input(s): "CKTOTAL", "CKMB", "CKMBINDEX", "TROPONINI" in the last 168 hours. BNP (last 3 results) No results for  input(s): "PROBNP" in the last 8760 hours. CBG: No results for input(s): "GLUCAP" in the last 168 hours. D-Dimer: No results for input(s): "DDIMER" in the last 72 hours. Hgb A1c: No results for input(s): "HGBA1C" in the last 72 hours. Lipid Profile: No results for input(s): "CHOL", "HDL", "LDLCALC", "TRIG", "CHOLHDL", "LDLDIRECT" in the last 72 hours. Thyroid function studies: No results for input(s): "TSH", "T4TOTAL", "T3FREE", "THYROIDAB" in the last 72 hours.  Invalid input(s): "FREET3" Anemia work up: No results for input(s): "VITAMINB12", "FOLATE", "FERRITIN", "TIBC", "IRON", "RETICCTPCT" in the last 72 hours. Sepsis Labs: Recent Labs  Lab 05/16/22 1654 05/17/22 0514 05/19/22 0521  WBC 8.6 8.7 13.2*    Microbiology Recent Results (from the past 240 hour(s))  Surgical pcr screen     Status: None   Collection Time: 05/18/22 12:51 PM   Specimen: Nasal Mucosa; Nasal Swab  Result Value Ref Range Status   MRSA, PCR NEGATIVE NEGATIVE Final   Staphylococcus  aureus NEGATIVE NEGATIVE Final    Comment: (NOTE) The Xpert SA Assay (FDA approved for NASAL specimens in patients 48 years of age and older), is one component of a comprehensive surveillance program. It is not intended to diagnose infection nor to guide or monitor treatment. Performed at [facility], [facility] [address], [place], [place] [address]      Medications:    acetaminophen  1,000 mg Oral Q6H   alvimopan  12 mg Oral BID   enoxaparin (LOVENOX) injection  40 mg Subcutaneous Q24H   feeding supplement  237 mL Oral BID BM   gabapentin  300 mg Oral QHS   lip balm   Topical BID   polycarbophil  625 mg Oral BID   sodium chloride flush  3 mL Intravenous Q12H   sodium chloride flush  3 mL Intravenous Q12H   Continuous Infusions:  sodium chloride     sodium chloride     0.9 % NaCl with KCl 40 mEq / L     lactated ringers     lactated ringers 50 mL/hr at 05/18/22 1639   methocarbamol (ROBAXIN)  IV        LOS: 2 days   Charlynne Cousins  [facility]  05/19/2022, 7:38 AM

## 2022-05-19 NOTE — Progress Notes (Addendum)
Linda Villanueva [id number] 05-24-1971  CARE TEAM:  PCP: Billie Ruddy, MD  Outpatient Care Team: Patient Care Team: Billie Ruddy, MD as PCP - General (Family Medicine) Michael Boston, MD as Consulting Physician (General Surgery) Truitt Merle, MD as Consulting Physician (Oncology)  Inpatient Treatment Team: Treatment Team: Attending Provider: Charlynne Cousins, MD; Consulting Physician: Nolon Nations, MD; Rounding Team: Suzan Garibaldi, MD; Registered Nurse: Charlyne Petrin, RN; Utilization Review: Alease Medina, RN; Registered Nurse: Tanda Rockers, RN   Problem List:   Principal Problem:   Mass of small intestine with obstruction s/p SB resection 05/18/2022 Active Problems:   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  Moderately bulky lymphadenopathy in the mesentery proximal to the nodule.  En bloc resection done.  230 cm of small intestine remaining from ligament of Treitz to ileocecal valve.  Numerous miliary 68m-60 mm liver nodules.  Wedge resection done of right hepatic lobe just lateral to the gallbladder hepatic fossa.   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  Assessment  [facility]  [facility]  Plan:  -Improve pain control.  Continue Tylenol ice and heat.  Methocarbamol scheduled.  Encourage patient to ask for pain medications.  Try and short frequency.  Continue gabapentin.  Shorten  hydrocodone interval. -Feeling nauseated.  Placed on standing ondansetron.  Other medications for breakthrough.  Reduce down to clear liquids only for now -Otherwise try ERAS pathway. Patient worried she was lightheaded the first time.  Check orthostatics.  IV fluid bolus.  Again try and help with mobilization.  Dannah benefit from PT/OT if still struggling.  Hopefully a temporary issue since she is otherwise healthy. -Hypokalemia and hypomagnesemia.  Replace.  IV and try some oral supplementation.  Defer to medicine on more aggressive adjustments. -Follow-up on pathology. -VTE prophylaxis- SCDs, etc -mobilize as tolerated to help recovery  Disposition: per primary service       I reviewed nursing notes, hospitalist notes, last 24 h vitals and pain scores, last 48 h intake and output, last 24 h labs and trends, and last 24 h imaging results. I have reviewed this patient's available data, including medical history, events of note, test results, etc as part of my evaluation.  A significant portion of that time was spent in counseling.  Care during the described time interval was provided by me.  This care required moderate level of medical decision making.  05/19/2022    Subjective: (Chief complaint)  Surgery yesterday.  Patient feeling sore and crampy and nauseated.  Nurse PNevin Bloodgoodworking to get nausea and pain medications.  Felt a little lightheaded trying to stand up and afraid to get up.  Family in room.  Objective:  Vital signs:  Vitals:   05/18/22 2205 05/19/22 0155 05/19/22 0500 05/19/22 0613  BP: (!) 103/56 127/75  130/66  Pulse: 77 78  80  Resp: '17 16  16  '$ Temp: 98.3 F (36.8 C)  98.4 F (36.9 C)  98.5 F (36.9 C)  TempSrc: Oral Oral  Oral  SpO2: 94% 94%  98%  Weight:   75.9 kg   Height:        Last BM Date : 05/16/22  Intake/Output   Yesterday:  07/01 0701 - 07/02 0700 In: 3529.8 [P.O.:720; I.V.:2609.8; IV Piggyback:200] Out: 550 [Urine:500; Blood:50] This  shift:  No intake/output data recorded.  Bowel function:  Flatus: No  BM:  No  Drain: (No drain)   Physical Exam:  General: Pt awake/alert in mild acute distress Eyes: PERRL, normal EOM.  Sclera clear.  No icterus Neuro: CN II-XII intact w/o focal sensory/motor deficits. Lymph: No head/neck/groin lymphadenopathy Psych:  No delerium/psychosis/paranoia.  Oriented x 4.  Anxious.  Partially consolable. HENT: Normocephalic, Mucus membranes moist.  No thrush Neck: Supple, No tracheal deviation.  No obvious thyromegaly Chest: No pain to chest wall compression.  Good respiratory excursion.  No audible wheezing CV:  Pulses intact.  Regular rhythm.  No major extremity edema MS: Normal AROM mjr joints.  No obvious deformity  Abdomen: Soft.  Mildy distended.  Tenderness at central abdomen incision.  Dressing clean dry and intact. .  No evidence of peritonitis.  No incarcerated hernias.  Ext:   No deformity.  No mjr edema.  No cyanosis Skin: No petechiae / purpurea.  No major sores.  Warm and dry    Results:   Cultures: Recent Results (from the past 720 hour(s))  Surgical pcr screen     Status: None   Collection Time: 05/18/22 12:51 PM   Specimen: Nasal Mucosa; Nasal Swab  Result Value Ref Range Status   MRSA, PCR NEGATIVE NEGATIVE Final   Staphylococcus aureus NEGATIVE NEGATIVE Final    Comment: (NOTE) The Xpert SA Assay (FDA approved for NASAL specimens in patients 17 years of age and older), is one component of a comprehensive surveillance program. It is not intended to diagnose infection nor to guide or monitor treatment. Performed at [facility], [place] [address], [place], [place] [address]     Labs: Results for orders placed or performed during the hospital encounter of 05/16/22 (from the past 48 hour(s))  Surgical pcr screen     Status: None   Collection Time: 05/18/22 12:51 PM   Specimen: Nasal Mucosa; Nasal Swab  Result Value Ref Range   MRSA, PCR  NEGATIVE NEGATIVE   Staphylococcus aureus NEGATIVE NEGATIVE    Comment: (NOTE) The Xpert SA Assay (FDA approved for NASAL specimens in patients 2 years of age and older), is one component of a comprehensive surveillance program. It is not intended to diagnose infection nor to guide or monitor treatment. Performed at [facility], [place] [address] [place], [place] [address]   Basic metabolic panel     Status: Abnormal   Collection Time: 05/19/22  5:21 AM  Result Value Ref Range   Sodium 135 135 - 145 mmol/L   Potassium 2.9 (L) 3.5 - 5.1 mmol/L   Chloride 103 98 - 111 mmol/L   CO2 23 22 - 32 mmol/L   Glucose, Bld 125 (H) 70 - 99 mg/dL    Comment: Glucose reference range applies only to samples taken after fasting for at least 8 hours.   BUN 5 (L) 6 - 20 mg/dL   Creatinine, Ser 0.62 0.44 - 1.00 mg/dL   Calcium 8.0 (L) 8.9 - 10.3 mg/dL   GFR, Estimated >60 >60 mL/min    Comment: (NOTE) Calculated using  the CKD-EPI Creatinine Equation (2021)    Anion gap 9 5 - 15    Comment: Performed at [facility], [place] [address], [place], [place] [address]  CBC     Status: Abnormal   Collection Time: 05/19/22  5:21 AM  Result Value Ref Range   WBC 13.2 (H) 4.0 - 10.5 K/uL   RBC 3.83 (L) 3.87 - 5.11 MIL/uL   Hemoglobin 10.7 (L) 12.0 - 15.0 g/dL   HCT 33.0 (L) 36.0 - 46.0 %   MCV 86.2 80.0 - 100.0 fL   MCH 27.9 26.0 - 34.0 pg   MCHC 32.4 30.0 - 36.0 g/dL   RDW 14.1 11.5 - 15.5 %   Platelets 283 150 - 400 K/uL   nRBC 0.0 0.0 - 0.2 %    Comment: Performed at [facility], [place] [address], [place], [place] [address]  Magnesium     Status: None   Collection Time: 05/19/22  5:21 AM  Result Value Ref Range   Magnesium 1.9 1.7 - 2.4 mg/dL    Comment: Performed at [facility], [place] [address] [place], [place] [address]    Imaging / Studies: No results found.  Medications / Allergies: per  chart  Antibiotics: Anti-infectives (From admission, onward)    Start     Dose/Rate Route Frequency Ordered Stop   05/19/22 0600  cefoTEtan (CEFOTAN) 2 g in sodium chloride 0.9 % 100 mL IVPB        2 g 200 mL/hr over 30 Minutes Intravenous On call to O.R. 05/18/22 1152 05/18/22 1328   05/19/22 0000  cefoTEtan (CEFOTAN) 2 g in sodium chloride 0.9 % 100 mL IVPB        2 g 200 mL/hr over 30 Minutes Intravenous Every 12 hours 05/18/22 1614 05/19/22 0037         Note: Portions of this report Naleigha have been transcribed using voice recognition software. Every effort was made to ensure accuracy; however, inadvertent computerized transcription errors [place] be present.   Any transcriptional errors that result from this process are unintentional.    Adin Hector, MD, FACS, MASCRS Esophageal, Gastrointestinal & Colorectal Surgery Robotic and Minimally Invasive Surgery  [facility], [place]  [place]. [address] [place], [place] [address] [phone] Fax [phone] Main  CONTACT INFORMATION:  Weekday (9AM-5PM): Call [facility] main office at [phone]  Weeknight (5PM-9AM) or Weekend/Holiday: Check [email or web address] (password " [email or web address]") for General Surgery [facility] coverage  (Please, do not use SecureChat as it is not reliable communication to operating surgeons for immediate patient care)      05/19/2022  8:16 AM

## 2022-05-20 ENCOUNTER — Encounter (HOSPITAL_COMMUNITY): Payer: Self-pay | Admitting: Internal Medicine

## 2022-05-20 DIAGNOSIS — K769 Liver disease, unspecified: Secondary | ICD-10-CM | POA: Diagnosis not present

## 2022-05-20 DIAGNOSIS — E0590 Thyrotoxicosis, unspecified without thyrotoxic crisis or storm: Secondary | ICD-10-CM | POA: Diagnosis not present

## 2022-05-20 DIAGNOSIS — K6389 Other specified diseases of intestine: Secondary | ICD-10-CM | POA: Diagnosis not present

## 2022-05-20 DIAGNOSIS — K56609 Unspecified intestinal obstruction, unspecified as to partial versus complete obstruction: Secondary | ICD-10-CM | POA: Diagnosis not present

## 2022-05-20 LAB — BASIC METABOLIC PANEL
Anion gap: 5 (ref 5–15)
BUN: <5 mg/dL — ABNORMAL LOW (ref 6–20)
CO2: 25 mmol/L (ref 22–32)
Calcium: 7.9 mg/dL — ABNORMAL LOW (ref 8.9–10.3)
Chloride: 110 mmol/L (ref 98–111)
Creatinine, Ser: 0.46 mg/dL (ref 0.44–1.00)
GFR, Estimated: >60 mL/min (ref >60)
Glucose, Bld: 105 mg/dL — ABNORMAL HIGH (ref 70–99)
Potassium: 4.8 mmol/L (ref 3.5–5.1)
Sodium: 140 mmol/L (ref 135–145)

## 2022-05-20 LAB — CBC
HCT: 30.7 % — ABNORMAL LOW (ref 36.0–46.0)
Hemoglobin: 10 g/dL — ABNORMAL LOW (ref 12.0–15.0)
MCH: 28.3 pg (ref 26.0–34.0)
MCHC: 32.6 g/dL (ref 30.0–36.0)
MCV: 87 fL (ref 80.0–100.0)
Platelets: 267 10*3/uL (ref 150–400)
RBC: 3.53 MIL/uL — ABNORMAL LOW (ref 3.87–5.11)
RDW: 14.8 % (ref 11.5–15.5)
WBC: 11 10*3/uL — ABNORMAL HIGH (ref 4.0–10.5)
nRBC: 0 % (ref 0.0–0.2)

## 2022-05-20 MED ORDER — SODIUM CHLORIDE 0.9 % IV SOLN: INTRAVENOUS | Status: AC

## 2022-05-20 NOTE — Consult Note (Addendum)
[place]  [phone] [phone]   [place]  Referral MD: Dr. 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  She presented to the emergency department with a 3-day history abdominal pain, nausea, vomiting, diarrhea.  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  CT chest without contrast showed indeterminate solid 3 mm right upper lobe pulmonary nodule.  She was taken to the OR on 05/18/2022 for exploratory laparotomy, small bowel resection, wedge liver biopsy.  Surgical pathology pending.  The patient reports improvement in her abdominal pain.  She is not currently having any nausea or vomiting.  Bowels not yet moving.  Prior to admission, she reports that her appetite was good and she has not lost any weight recently.  She did report some facial flushing and diarrhea a few weeks ago.  She is not having any headaches or dizziness.  Denies chest pain shortness of breath.  No bleeding reported.  The patient is married and has 10 children.  Denies history of alcohol and tobacco use.  Denies family history of malignancy.  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  Past Medical History:  Diagnosis Date   Vitamin B12 deficiency    Vitamin D deficiency   :  Past Surgical History:  Procedure Laterality Date   INNER EAR SURGERY     LAPAROTOMY N/A 05/18/2022   Procedure: EXPLORATORY LAPAROTOMY;  SMALL BOWEL RESECTION; WEDGE LIVER BIOPSY;  Surgeon: Michael Boston, MD;  Location: [facility];  Service: General;  Laterality: N/A;  :  Current Facility-Administered Medications  Medication Dose Route Frequency Provider Last Rate Last Admin   0.9 %  sodium chloride infusion   Intravenous Continuous Charlynne Cousins, MD       0.9 % NaCl with KCl 40 mEq / L  infusion   Intravenous Continuous Charlynne Cousins, MD 100 mL/hr at 05/19/22 0838 New Bag at 05/19/22 1308   acetaminophen (TYLENOL) tablet 650 mg  650 mg Oral Q6H PRN Michael Boston, MD   650 mg at 05/17/22 2120   Or   acetaminophen (TYLENOL) suppository 650 mg  650 mg Rectal Q6H PRN Michael Boston, MD       acetaminophen (TYLENOL) tablet 1,000 mg  1,000 mg Oral Lajuana Ripple, MD   1,000 mg at 05/20/22 0511   alum & mag hydroxide-simeth (MAALOX/MYLANTA) 200-200-20 MG/5ML suspension 30 mL  30 mL Oral Q6H PRN Michael Boston, MD       alvimopan (ENTEREG) capsule 12 mg  12 mg Oral BID Michael Boston, MD   12 mg at 05/19/22 2155   diphenhydrAMINE (BENADRYL) 12.5 MG/5ML elixir 12.5 mg  12.5 mg Oral Q6H PRN Michael Boston, MD       Or   diphenhydrAMINE (BENADRYL) injection 12.5 mg  12.5 mg Intravenous Q6H PRN Michael Boston, MD       enoxaparin (LOVENOX) injection 40 mg  40 mg Subcutaneous Q24H Michael Boston, MD   40 mg at 05/19/22 2155   feeding supplement (ENSURE SURGERY) liquid 237 mL  237 mL Oral BID  BM Michael Boston, MD       gabapentin (NEURONTIN) capsule 100 mg  100 mg Oral TID Michael Boston, MD   100 mg at 05/19/22 2155   hydrALAZINE (APRESOLINE) injection 10 mg  10 mg Intravenous Q2H PRN Michael Boston, MD       HYDROmorphone (DILAUDID) injection 0.5-2 mg  0.5-2 mg Intravenous Q2H PRN Michael Boston, MD       lactated ringers bolus 1,000 mL  1,000 mL Intravenous Q8H PRN Michael Boston, MD       lip balm (CARMEX) ointment   Topical BID Michael Boston, MD   1 Application at 40/98/11 1119   magic mouthwash  15 mL Oral QID PRN Michael Boston, MD        melatonin tablet 3 mg  3 mg Oral QHS PRN Michael Boston, MD       methocarbamol (ROBAXIN) 1,000 mg in dextrose 5 % 100 mL IVPB  1,000 mg Intravenous Q6H PRN Michael Boston, MD       methocarbamol (ROBAXIN) 500 mg in dextrose 5 % 50 mL IVPB  500 mg Intravenous Linda Netters, MD 110 mL/hr at 05/20/22 0514 500 mg at 05/20/22 0514   methocarbamol (ROBAXIN) tablet 1,000 mg  1,000 mg Oral Q6H PRN Michael Boston, MD       metoprolol tartrate (LOPRESSOR) injection 5 mg  5 mg Intravenous Q6H PRN Michael Boston, MD       ondansetron [facility]) tablet 4 mg  4 mg Oral Q6H PRN Michael Boston, MD       Or   ondansetron [facility]) injection 4 mg  4 mg Intravenous Q6H PRN Michael Boston, MD   4 mg at 05/19/22 0833   ondansetron (ZOFRAN) tablet 4 mg  4 mg Oral TID AC & HS Michael Boston, MD   4 mg at 05/19/22 2155   polycarbophil (FIBERCON) tablet 625 mg  625 mg Oral BID Michael Boston, MD   625 mg at 05/19/22 2155   potassium chloride SA (KLOR-CON M) CR tablet 40 mEq  40 mEq Oral Daily Michael Boston, MD   40 mEq at 05/19/22 1125   prochlorperazine (COMPAZINE) tablet 10 mg  10 mg Oral Q6H PRN Michael Boston, MD       Or   prochlorperazine (COMPAZINE) injection 5-10 mg  5-10 mg Intravenous Q6H PRN Michael Boston, MD   10 mg at 05/18/22 2228   simethicone (MYLICON) chewable tablet 40 mg  40 mg Oral Q6H PRN Michael Boston, MD       sodium chloride flush (NS) 0.9 % injection 3 mL  3 mL Intravenous Gorden Harms, MD   3 mL at 05/19/22 1126   traMADol (ULTRAM) tablet 50-100 mg  50-100 mg Oral Q6H PRN Michael Boston, MD   100 mg at 05/20/22 0511    No Known Allergies:  Family History  Problem Relation Age of Onset   Hypertension Mother    Hypertension Father    Thyroid disease Sister   :  Social History   Socioeconomic History   Marital status: Married    Spouse name: Not on file   Number of children: Not on file   Years of education: Not on file   Highest education level: Not on file  Occupational  History   Not on file  Tobacco Use   Smoking status: Never   Smokeless tobacco: Never  Vaping Use   Vaping Use: Never used  Substance and Sexual Activity   Alcohol use: No  Drug use: No   Sexual activity: Yes    Partners: Male    Birth control/protection: I.U.D.  Other Topics Concern   Not on file  Social History Narrative   Family originally from [place]   Social Determinants of Health   Financial Resource Strain: Not on file  Food Insecurity: Not on file  Transportation Needs: Not on file  Physical Activity: Not on file  Stress: Not on file  Social Connections: Not on file  Intimate Partner Violence: Not on file  :  Review of Systems: A comprehensive 14 point review of systems was negative except as noted in the HPI.  Exam: Patient Vitals for the past 24 hrs:  BP Temp Temp src Pulse Resp SpO2  05/20/22 0518 133/77 98.2 F (36.8 C) Oral 75 18 99 %  05/20/22 0512 133/77 -- -- -- -- --  05/19/22 2157 95/67 98.4 F (36.9 C) Oral 72 18 99 %  05/19/22 1242 123/73 97.9 F (36.6 C) Oral 89 19 99 %  05/19/22 1240 126/68 97.7 F (36.5 C) Oral 81 18 97 %  05/19/22 1237 128/63 97.8 F (36.6 C) Oral 76 18 99 %  05/19/22 0939 140/75 98.4 F (36.9 C) Oral 69 16 99 %    General:  well-nourished in no acute distress.   Eyes:  no scleral icterus.   ENT:  There were no oropharyngeal lesions.   Lymphatics:  Negative cervical, supraclavicular or axillary adenopathy.   Respiratory: lungs were clear bilaterally without wheezing or crackles.   Cardiovascular:  Regular rate and rhythm, S1/S2, without murmur, rub or gallop.  There was no pedal edema.   GI: Positive bowel sounds, soft, mildly distended, reports some mild tenderness   Skin exam was without echymosis, petichae.   Neuro exam was nonfocal. Patient was alert and oriented.  Attention was good.   Language was appropriate.  Mood was normal without depression.  Speech was not pressured.  Thought content was not tangential.      Lab Results  Component Value Date   WBC 11.0 (H) 05/20/2022   HGB 10.0 (L) 05/20/2022   HCT 30.7 (L) 05/20/2022   PLT 267 05/20/2022   GLUCOSE 105 (H) 05/20/2022   CHOL 215 (H) 07/26/2020   TRIG 88 07/26/2020   HDL 62 07/26/2020   LDLCALC 134 (H) 07/26/2020   ALT 14 05/17/2022   AST 16 05/17/2022   NA 140 05/20/2022   K 4.8 05/20/2022   CL 110 05/20/2022   CREATININE 0.46 05/20/2022   BUN <5 (L) 05/20/2022   CO2 25 05/20/2022    CT CHEST WO CONTRAST  Result Date: 05/17/2022 CLINICAL DATA:  Colon cancer, metastatic, staging EXAM: CT CHEST WITHOUT CONTRAST TECHNIQUE: Multidetector CT imaging of the chest was performed following the standard protocol without IV contrast. RADIATION DOSE REDUCTION: This exam was performed according to the departmental dose-optimization program which includes automated exposure control, adjustment of the mA and/or kV according to patient size and/or use of iterative reconstruction technique. COMPARISON:  None Available. FINDINGS: Cardiovascular: Normal cardiac size. No pericardial disease. Normal size main and branch pulmonary arteries. The thoracic aorta is unremarkable. Mediastinum/Nodes: No lymphadenopathy. The thyroid is unremarkable. The esophagus is unremarkable. Lungs/Pleura: There is a solid 3 mm right upper lobe pulmonary nodule (series 4, image 22). No focal airspace disease. No pleural effusion. No pneumothorax. Upper Abdomen: Multiple liver masses best seen on contrast enhanced CT of the abdomen pelvis acquired yesterday. Musculoskeletal: No acute osseous abnormality. No suspicious osseous lesion.  IMPRESSION: Indeterminate solid 3 mm right upper lobe pulmonary nodule. Multiple liver masses best seen on recent contrast enhanced CT of the abdomen and pelvis. Electronically Signed   By: Maurine Simmering M.D.   On: 05/17/2022 08:15   CT ABDOMEN PELVIS W CONTRAST  Result Date: 05/16/2022 CLINICAL DATA:  Upper abdominal pain with nausea vomiting EXAM: CT  ABDOMEN AND PELVIS WITH CONTRAST TECHNIQUE: Multidetector CT imaging of the abdomen and pelvis was performed using the standard protocol following bolus administration of intravenous contrast. RADIATION DOSE REDUCTION: This exam was performed according to the departmental dose-optimization program which includes automated exposure control, adjustment of the mA and/or kV according to patient size and/or use of iterative reconstruction technique. CONTRAST:  147m OMNIPAQUE IOHEXOL 300 MG/ML  SOLN COMPARISON:  None Available. FINDINGS: Lower chest: Lung bases demonstrate no acute consolidation or effusion. Hepatobiliary: Multiple hyperenhancing liver masses are visualized. The largest is seen within the left hepatic lobe and measures approximately 4.7 cm and demonstrates central low density. No calcified gallstone. Mildly prominent common bile duct up to 7 mm. Pancreas: Unremarkable. No pancreatic ductal dilatation or surrounding inflammatory changes. Spleen: Normal in size without focal abnormality. Adrenals/Urinary Tract: Adrenal glands are unremarkable. Kidneys are normal, without renal calculi, focal lesion, or hydronephrosis. Bladder is unremarkable. Stomach/Bowel: The stomach is nonenlarged. Negative appendix. Fluid-filled mildly dilated mid to distal small bowel with some fecalized segments but no well-defined transition point. Some mesenteric vascular congestion. No intramural air or bowel wall thickening. Negative appendix. Focal area of hyperenhancing small bowel, series 2, image 41, coronal series 4, image 35. Vascular/Lymphatic: Nonaneurysmal aorta.  No suspicious lymph nodes. Reproductive: Uterus and bilateral adnexa are unremarkable. Other: No free air. Small moderate volume free fluid. Avidly enhancing central mesenteric mass measuring 2.9 by 2 cm, series 2, image 47 Musculoskeletal: No acute or significant osseous findings. IMPRESSION: 1. Multiple mildly dilated fluid-filled loops of mid small bowel,  suspicious for partial small bowel obstruction, poorly defined transition point. Some mesenteric vascular congestion but no intramural air or bowel wall thickening. 2. 2.9 cm enhancing central mesenteric mass. 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 Focal segment of hyperenhancing small bowel could be potential source/site of primary. 3. Small moderate volume free fluid in the pelvis 4. Mildly prominent common bile duct which Sheritta be correlated with LFTs. Electronically Signed   By: KDonavan FoilM.D.   On: 05/16/2022 19:45   MR LUMBAR SPINE WO CONTRAST  Result Date: 04/22/2022 CLINICAL DATA:  Initial evaluation for chronic low back pain with radiation into the right lower extremity. EXAM: MRI LUMBAR SPINE WITHOUT CONTRAST TECHNIQUE: Multiplanar, multisequence MR imaging of the lumbar spine was performed. No intravenous contrast was administered. COMPARISON:  None available. FINDINGS: Segmentation: Standard. Lowest well-formed disc space labeled the L5-S1 level. Alignment: Physiologic with preservation of the normal lumbar lordosis. No listhesis. Vertebrae: Vertebral body height maintained without acute or chronic fracture. Bone marrow signal intensity within normal limits. Multiple scattered benign hemangiomata noted. No worrisome osseous lesions. No abnormal marrow edema. Conus medullaris and cauda equina: Conus extends to the L1-2 level. Conus and cauda equina appear normal. Paraspinal and other soft tissues: Unremarkable. Disc levels: L1-2:  Unremarkable. L2-3:  Unremarkable. L3-4: Mild disc bulge. No spinal stenosis. Foramina remain patent. L4-5: Disc desiccation. Shallow central disc protrusion with annular fissure indents the ventral thecal sac (series 6, image 27). Resultant mild bilateral subarticular stenosis. Central canal remains patent. No significant foraminal narrowing. L5-S1:  Normal interspace. Mild bilateral facet  hypertrophy. No canal or foraminal stenosis. IMPRESSION: 1. Shallow central disc protrusion at L4-5 with resultant mild bilateral subarticular stenosis. 2. Mild bilateral facet hypertrophy at L5-S1 without stenosis. Electronically Signed   By: Jeannine Boga M.D.   On: 04/22/2022 05:30     CT CHEST WO CONTRAST  Result Date: 05/17/2022 CLINICAL DATA:  Colon cancer, metastatic, staging EXAM: CT CHEST WITHOUT CONTRAST TECHNIQUE: Multidetector CT imaging of the chest was performed following the standard protocol without IV contrast. RADIATION DOSE REDUCTION: This exam was performed according to the departmental dose-optimization program which includes automated exposure control, adjustment of the mA and/or kV according to patient size and/or use of iterative reconstruction technique. COMPARISON:  None Available. FINDINGS: Cardiovascular: Normal cardiac size. No pericardial disease. Normal size main and branch pulmonary arteries. The thoracic aorta is unremarkable. Mediastinum/Nodes: No lymphadenopathy. The thyroid is unremarkable. The esophagus is unremarkable. Lungs/Pleura: There is a solid 3 mm right upper lobe pulmonary nodule (series 4, image 22). No focal airspace disease. No pleural effusion. No pneumothorax. Upper Abdomen: Multiple liver masses best seen on contrast enhanced CT of the abdomen pelvis acquired yesterday. Musculoskeletal: No acute osseous abnormality. No suspicious osseous lesion. IMPRESSION: Indeterminate solid 3 mm right upper lobe pulmonary nodule. Multiple liver masses best seen on recent contrast enhanced CT of the abdomen and pelvis. Electronically Signed   By: Maurine Simmering M.D.   On: 05/17/2022 08:15   CT ABDOMEN PELVIS W CONTRAST  Result Date: 05/16/2022 CLINICAL DATA:  Upper abdominal pain with nausea vomiting EXAM: CT ABDOMEN AND PELVIS WITH CONTRAST TECHNIQUE: Multidetector CT imaging of the abdomen and pelvis was performed using the standard protocol following bolus  administration of intravenous contrast. RADIATION DOSE REDUCTION: This exam was performed according to the departmental dose-optimization program which includes automated exposure control, adjustment of the mA and/or kV according to patient size and/or use of iterative reconstruction technique. CONTRAST:  124m OMNIPAQUE IOHEXOL 300 MG/ML  SOLN COMPARISON:  None Available. FINDINGS: Lower chest: Lung bases demonstrate no acute consolidation or effusion. Hepatobiliary: Multiple hyperenhancing liver masses are visualized. The largest is seen within the left hepatic lobe and measures approximately 4.7 cm and demonstrates central low density. No calcified gallstone. Mildly prominent common bile duct up to 7 mm. Pancreas: Unremarkable. No pancreatic ductal dilatation or surrounding inflammatory changes. Spleen: Normal in size without focal abnormality. Adrenals/Urinary Tract: Adrenal glands are unremarkable. Kidneys are normal, without renal calculi, focal lesion, or hydronephrosis. Bladder is unremarkable. Stomach/Bowel: The stomach is nonenlarged. Negative appendix. Fluid-filled mildly dilated mid to distal small bowel with some fecalized segments but no well-defined transition point. Some mesenteric vascular congestion. No intramural air or bowel wall thickening. Negative appendix. Focal area of hyperenhancing small bowel, series 2, image 41, coronal series 4, image 35. Vascular/Lymphatic: Nonaneurysmal aorta.  No suspicious lymph nodes. Reproductive: Uterus and bilateral adnexa are unremarkable. Other: No free air. Small moderate volume free fluid. Avidly enhancing central mesenteric mass measuring 2.9 by 2 cm, series 2, image 47 Musculoskeletal: No acute or significant osseous findings. IMPRESSION: 1. Multiple mildly dilated fluid-filled loops of mid small bowel, suspicious for partial small bowel obstruction, poorly defined transition point. Some mesenteric vascular congestion but no intramural air or bowel wall  thickening. 2. 2.9 cm enhancing central mesenteric mass. 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 Focal segment of hyperenhancing small bowel could be potential source/site of primary. 3. Small moderate volume free fluid  in the pelvis 4. Mildly prominent common bile duct which Dorlene be correlated with LFTs. Electronically Signed   By: Donavan Foil M.D.   On: 05/16/2022 19:45   MR LUMBAR SPINE WO CONTRAST  Result Date: 04/22/2022 CLINICAL DATA:  Initial evaluation for chronic low back pain with radiation into the right lower extremity. EXAM: MRI LUMBAR SPINE WITHOUT CONTRAST TECHNIQUE: Multiplanar, multisequence MR imaging of the lumbar spine was performed. No intravenous contrast was administered. COMPARISON:  None available. FINDINGS: Segmentation: Standard. Lowest well-formed disc space labeled the L5-S1 level. Alignment: Physiologic with preservation of the normal lumbar lordosis. No listhesis. Vertebrae: Vertebral body height maintained without acute or chronic fracture. Bone marrow signal intensity within normal limits. Multiple scattered benign hemangiomata noted. No worrisome osseous lesions. No abnormal marrow edema. Conus medullaris and cauda equina: Conus extends to the L1-2 level. Conus and cauda equina appear normal. Paraspinal and other soft tissues: Unremarkable. Disc levels: L1-2:  Unremarkable. L2-3:  Unremarkable. L3-4: Mild disc bulge. No spinal stenosis. Foramina remain patent. L4-5: Disc desiccation. Shallow central disc protrusion with annular fissure indents the ventral thecal sac (series 6, image 27). Resultant mild bilateral subarticular stenosis. Central canal remains patent. No significant foraminal narrowing. L5-S1: Normal interspace. Mild bilateral facet hypertrophy. No canal or foraminal stenosis. IMPRESSION: 1. Shallow central disc protrusion at L4-5 with resultant mild bilateral subarticular stenosis.  2. Mild bilateral facet hypertrophy at L5-S1 without stenosis. Electronically Signed   By: Jeannine Boga M.D.   On: 04/22/2022 05:30    Assessment and Plan:  This is a 51 year old female with hypothyroidism and hyperlipidemia who presented to the emergency department with 3-day history of abdominal pain, nausea, vomiting, diarrhea.  Imaging findings consistent with small bowel obstruction and liver masses concerning for carcinoid.  She underwent small bowel resection and wedge liver biopsy on 05/18/2022.  Pathology results are currently pending.  Discussed imaging results with the patient.  Findings concerning for carcinoid.  However, biopsy pending.  We will have further discussions with the patient regarding treatment pending biopsy results.  If consistent with carcinoid, will need dotatate scan as an outpatient.  Thank you for this referral.   Mikey Bussing, DNP, AGPCNP-BC, AOCNP   Attending Note  I personally saw the patient, reviewed the chart and examined the patient. The plan of care was discussed with the patient and the admitting team. I agree with the assessment and plan as documented above. Thank you very much for the consultation. 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 We discussed that we have to await final pathology to review treatment recommendations. She is comfortable at the time of exam, BS appreciated, hasnt been able to pass gas or eat yet. We will arrange for outpatient follow up at discharge. I will review pathology results when available Chromogranin pending.

## 2022-05-20 NOTE — Progress Notes (Signed)
[facility] PROGRESS NOTE    Progress Note  Linda Villanueva  SAY:[id number] DOB: 1971-09-03 DOA: 05/16/2022 PCP: Billie Ruddy, MD     Brief Narrative:   Linda Villanueva is an 51 y.o. female past medical history significant for hypothyroidism hyperlipidemia comes in with abdominal pain nausea vomiting and diarrhea that started 3 days prior to admission.  CT scan of the abdomen shows small bowel obstruction and an enhancing mesenteric mass with liver metastases suspicious for carcinoid with small volume free fluid.CT of the abdomen and pelvis 3 cm mesenteric mass and multiple enhancing liver masses concerning for metastatic disease with focal segmental hyperenhancing small bowel.    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 Her diet has been advanced. Awaiting pathology report further management per surgery. Surgery agreed to take over.  Orthostatic hypotension: She relates nicely dizziness upon standing. KVO IV fluids recheck orthostatic. Orthostasis has resolved.  Leukocytosis: Likely reactive continue to monitor fever curve.  Normocytic anemia: Likely due to surgical intervention. Continue to monitor hemoglobin.  Hypokalemia: Repleted repleted IV now improved.  Hypothyroidism: On no medications at home.  Hyperlipidemia: On no medications.    DVT prophylaxis: lovenox Family Communication:none Status is: Inpatient The patient will require care spanning > 2 midnights and should be moved to inpatient because: Jejunal mass causing partial obstruction.    Code Status:     Code Status Orders  (From admission, onward)           Start     Ordered   05/16/22 2151  Full code  Continuous        05/16/22 2152           Code Status History      This patient has a current code status but no historical code status.         IV Access:   Peripheral IV   Procedures and diagnostic studies:   No results found.   Medical Consultants:   None.   Subjective:    Linda Villanueva relates her pain is controlled dizzy upon standing is resolved.  Objective:    Vitals:   05/19/22 1242 05/19/22 2157 05/20/22 0512 05/20/22 0518  BP: 123/73 95/67 133/77 133/77  Pulse: 89 72  75  Resp: '19 18  18  '$ Temp: 97.9 F (36.6 C) 98.4 F (36.9 C)  98.2 F (36.8 C)  TempSrc: Oral Oral  Oral  SpO2: 99% 99%  99%  Weight:      Height:       SpO2: 99 % O2 Flow Rate (L/min): 2 L/min   Intake/Output Summary (Last 24 hours) at 05/20/2022 0758 Last data filed at 05/20/2022 0600 Gross per 24 hour  Intake 1203.25 ml  Output 0 ml  Net 1203.25 ml    Filed Weights   05/16/22 1610 05/18/22 0500 05/19/22 0500  Weight: 78 kg 75.9 kg 75.9 kg    Exam: General exam: In no acute distress. Respiratory system: Good air movement and clear to auscultation. Cardiovascular system: S1 & S2 heard, RRR. No JVD. Gastrointestinal system: Abdomen is nondistended, soft and nontender.  Extremities: No pedal edema. Skin: No rashes, lesions or ulcers Psychiatry: Judgement and insight appear normal. Mood & affect appropriate.  Data Reviewed:    Labs: Basic Metabolic Panel: Recent Labs  Lab 05/16/22 1654 05/17/22  8676 05/19/22 0521 05/20/22 0422  NA 141 142 135 140  K 4.1 3.5 2.9* 4.8  CL 107 114* 103 110  CO2 '25 25 23 25  '$ GLUCOSE 120* 101* 125* 105*  BUN 10 6 5* <5*  CREATININE 0.51 0.61 0.62 0.46  CALCIUM 9.2 8.2* 8.0* 7.9*  MG  --   --  1.9  --     GFR Estimated Creatinine Clearance: 90.4 mL/min (by C-G formula based on SCr of 0.46 mg/dL). Liver Function Tests: Recent Labs  Lab 05/16/22 1654 05/17/22 0514  AST 21 16  ALT 15 14  ALKPHOS 78 64  BILITOT 0.7 0.7  PROT 8.1 6.4*  ALBUMIN 4.1 3.2*    Recent Labs  Lab  05/16/22 1654  LIPASE 28    No results for input(s): "AMMONIA" in the last 168 hours. Coagulation profile No results for input(s): "INR", "PROTIME" in the last 168 hours. COVID-19 Labs  No results for input(s): "DDIMER", "FERRITIN", "LDH", "CRP" in the last 72 hours.  Lab Results  Component Value Date   [place] Not Detected 10/27/2019    CBC: Recent Labs  Lab 05/16/22 1654 05/17/22 0514 05/19/22 0521  WBC 8.6 8.7 13.2*  NEUTROABS 5.7  --   --   HGB 13.7 12.1 10.7*  HCT 42.3 37.3 33.0*  MCV 86.3 88.2 86.2  PLT 337 283 283    Cardiac Enzymes: No results for input(s): "CKTOTAL", "CKMB", "CKMBINDEX", "TROPONINI" in the last 168 hours. BNP (last 3 results) No results for input(s): "PROBNP" in the last 8760 hours. CBG: No results for input(s): "GLUCAP" in the last 168 hours. D-Dimer: No results for input(s): "DDIMER" in the last 72 hours. Hgb A1c: No results for input(s): "HGBA1C" in the last 72 hours. Lipid Profile: No results for input(s): "CHOL", "HDL", "LDLCALC", "TRIG", "CHOLHDL", "LDLDIRECT" in the last 72 hours. Thyroid function studies: No results for input(s): "TSH", "T4TOTAL", "T3FREE", "THYROIDAB" in the last 72 hours.  Invalid input(s): "FREET3" Anemia work up: No results for input(s): "VITAMINB12", "FOLATE", "FERRITIN", "TIBC", "IRON", "RETICCTPCT" in the last 72 hours. Sepsis Labs: Recent Labs  Lab 05/16/22 1654 05/17/22 0514 05/19/22 0521  WBC 8.6 8.7 13.2*    Microbiology Recent Results (from the past 240 hour(s))  Surgical pcr screen     Status: None   Collection Time: 05/18/22 12:51 PM   Specimen: Nasal Mucosa; Nasal Swab  Result Value Ref Range Status   MRSA, PCR NEGATIVE NEGATIVE Final   Staphylococcus aureus NEGATIVE NEGATIVE Final    Comment: (NOTE) The Xpert SA Assay (FDA approved for NASAL specimens in patients 65 years of age and older), is one component of a comprehensive surveillance program. It is not intended to diagnose  infection nor to guide or monitor treatment. Performed at [facility], [place] [address] [place], [place] [address]      Medications:    acetaminophen  1,000 mg Oral Q6H   alvimopan  12 mg Oral BID   enoxaparin (LOVENOX) injection  40 mg Subcutaneous Q24H   feeding supplement  237 mL Oral BID BM   gabapentin  100 mg Oral TID   lip balm   Topical BID   ondansetron  4 mg Oral TID AC & HS   polycarbophil  625 mg Oral BID   potassium chloride  40 mEq Oral Daily   sodium chloride flush  3 mL Intravenous Q12H   Continuous Infusions:  0.9 % NaCl with KCl 40 mEq / L 100 mL/hr at 05/19/22 3267  lactated ringers     methocarbamol (ROBAXIN) IV     methocarbamol (ROBAXIN) IV 500 mg (05/20/22 0514)      LOS: 3 days   Charlynne Cousins  [facility]  05/20/2022, 7:58 AM

## 2022-05-20 NOTE — Progress Notes (Signed)
[facility] Progress Note  2 Days Post-Op  Subjective: CC:  C/o incisional pain that improves with PO pain meds. She reports she has been getting OOB and walking. She is having some flatus but denies BM. She is tolerating CLD. Still having intermittent dizziness with standing, not present prior to surgery. Denies CP or urinary sxs.   Objective: Vital signs in last 24 hours: Temp:  [97.7 F (36.5 C)-98.4 F (36.9 C)] 98.2 F (36.8 C) (07/03 0518) Pulse Rate:  [69-89] 75 (07/03 0518) Resp:  [16-19] 18 (07/03 0518) BP: (95-140)/(63-77) 133/77 (07/03 0518) SpO2:  [97 %-99 %] 99 % (07/03 0518) Last BM Date : 05/16/22  Intake/Output from previous day: 07/02 0701 - 07/03 0700 In: 1203.3 [P.O.:720; I.V.:197; IV Piggyback:286.2] Out: 0  Intake/Output this shift: No intake/output data recorded.  PE: Gen:  Alert, NAD, pleasant Card:  Regular rate and rhythm Pulm:  Normal effort ORA Abd: Soft, mild distention, appropriately tender, +BS Skin: warm and dry, no rashes  Psych: A&Ox3   Lab Results:  Recent Labs    05/19/22 0521 05/20/22 0728  WBC 13.2* 11.0*  HGB 10.7* 10.0*  HCT 33.0* 30.7*  PLT 283 267   BMET Recent Labs    05/19/22 0521 05/20/22 0422  NA 135 140  K 2.9* 4.8  CL 103 110  CO2 23 25  GLUCOSE 125* 105*  BUN 5* <5*  CREATININE 0.62 0.46  CALCIUM 8.0* 7.9*   PT/INR No results for input(s): "LABPROT", "INR" in the last 72 hours. CMP     Component Value Date/Time   NA 140 05/20/2022 0422   K 4.8 05/20/2022 0422   CL 110 05/20/2022 0422   CO2 25 05/20/2022 0422   GLUCOSE 105 (H) 05/20/2022 0422   BUN <5 (L) 05/20/2022 0422   CREATININE 0.46 05/20/2022 0422   CALCIUM 7.9 (L) 05/20/2022 0422   PROT 6.4 (L) 05/17/2022 0514   ALBUMIN 3.2 (L) 05/17/2022 0514   AST 16 05/17/2022 0514   ALT 14 05/17/2022 0514   ALKPHOS 64 05/17/2022 0514   BILITOT 0.7 05/17/2022 0514   GFRNONAA >60 05/20/2022 0422   GFRAA >60 06/03/2018 2052   Lipase      Component Value Date/Time   LIPASE 28 05/16/2022 1654       Studies/Results: No results found.  Anti-infectives: Anti-infectives (From admission, onward)    Start     Dose/Rate Route Frequency Ordered Stop   05/19/22 0600  cefoTEtan (CEFOTAN) 2 g in sodium chloride 0.9 % 100 mL IVPB        2 g 200 mL/hr over 30 Minutes Intravenous On call to O.R. 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 Johney Maine - POD#2, path pending  - afebrile, VSS, orthostatic hypotension resolved  - continue entereg and await further bowel function - IS/Pulm toilet, mobilize   FEN: FLD, ensure ID: perioperative abx VTE: SCD's, lovenox Foley; none Dispo: await bowel function, await path. If path confirms carcinoid will need onc consult    LOS: 3 days    Obie Dredge, [facility] Please see Amion for pager number during day  hours 7:00am-4:30pm

## 2022-05-21 LAB — CHROMOGRANIN A: Chromogranin A (ng/mL): 327.9 ng/mL — ABNORMAL HIGH (ref 0.0–101.8)

## 2022-05-21 MED ORDER — METHOCARBAMOL 1000 MG/10ML IJ SOLN: 500.0000 mg | Freq: Four times a day (QID) | INTRAVENOUS | Status: DC | PRN

## 2022-05-21 MED ORDER — POTASSIUM CHLORIDE CRYS ER 20 MEQ PO TBCR
40.0000 meq | EXTENDED_RELEASE_TABLET | Freq: Once | ORAL | Status: AC
Start: 2022-05-21 — End: 2022-05-21
  Administered 2022-05-21: 40 meq via ORAL
  Filled 2022-05-21: qty 2

## 2022-05-21 MED ORDER — LACTATED RINGERS IV BOLUS: 1000.0000 mL | Freq: Three times a day (TID) | INTRAVENOUS | Status: AC | PRN

## 2022-05-21 MED ORDER — HYDROMORPHONE HCL 1 MG/ML IJ SOLN: 0.5000 mg | INTRAMUSCULAR | Status: DC | PRN

## 2022-05-21 NOTE — Progress Notes (Signed)
Linda Villanueva [id number] 1971-11-18  CARE TEAM:  PCP: Billie Ruddy, MD  Outpatient Care Team: Patient Care Team: Billie Ruddy, MD as PCP - General (Family Medicine) Michael Boston, MD as Consulting Physician (General Surgery) Truitt Merle, MD as Consulting Physician (Oncology)  Inpatient Treatment Team: Treatment Team: Attending Provider: Nolon Nations, MD; Consulting Physician: Edison Pace, Md, MD; Rounding Team: Suzan Garibaldi, MD; Nurse Practitioner: Maryanna Shape, NP; Consulting Physician: Benay Pike, MD; Charge Nurse: Heloise Ochoa, RN; Technician: Abbe Amsterdam, NT; Utilization Review: Lacretia Leigh, RN; Registered Nurse: Aura Dials, RN; Physical Therapist: Coolidge Breeze, PT   Problem List:   Principal Problem:   Mass of small intestine with obstruction s/p SB resection 05/18/2022 Active Problems:   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  Moderately bulky lymphadenopathy in the mesentery proximal to the nodule.  En bloc resection done.  230 cm of small intestine remaining from ligament of Treitz to ileocecal valve.  Numerous miliary 30m-60 mm liver nodules.  Wedge resection done of right hepatic lobe just lateral to the gallbladder hepatic fossa.   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  Assessment  [facility]  [facility]  Plan:  -Continue Tylenol and gabapentin with ice pain  control.  Switch methocarbamol back to as needed.  Hydrocodone for backup. -Ileus seems to be resolving.  Continue full liquids.  Maida be gently advance. -ERAS pathway. -Orthostasis resolved.  Mobilizing better  patient worried she was lightheaded the first time.  Check orthostatics.  IV fluid bolus.  Again try and help with mobilization.  Chalet benefit from PT/OT if still struggling.  Hopefully a temporary issue since she is otherwise healthy. -Hypokalemia and hypomagnesemia.  Replaced.  Recheck tomorrow  -Patient and husband concerned with many questions about the tumor diagnosis..  Noted my suspicion this is probably a carcinoid tumor with liver metastasis.  Seen by medical oncology.  Testing & work-up going on.  Follow-up on pathology.  We will discuss at GI tumor board.  I noted they cannot control what her body grew but to continue focus on energy on n recovering from surgery.  That way should she need post adjuvant chemotherapy or treatments, she will be in better shape to handle that.  Save her questions once we know what path is in multidisciplinary discussions have been made.  Hopefully then we can help address them.  While anxious they feel reassured and appreciate the feedback.  -VTE prophylaxis- SCDs, etc -mobilize as tolerated to help recovery  Disposition: Awaiting return of bowel function.  Possible discharge later in the week if meets goal.       I reviewed nursing notes, hospitalist notes, last 24 h vitals and pain scores, last 48 h intake and output, last 24 h labs and trends, and last 24 h imaging results. I have reviewed this patient's available data, including medical history, events of note, test results, etc as  part of my evaluation.  A significant portion of that time was spent in counseling.  Care during the described time interval was provided by me.  This care required moderate level of medical decision making.  05/21/2022    Subjective: (Chief complaint)  Patient  feeling much better.  Pain less.  Husband in room.  Walking more.  Up in chair.  No nausea or vomiting.  Abdominal cramping less.  Objective:  Vital signs:  Vitals:   05/20/22 0518 05/20/22 1226 05/20/22 2154 05/21/22 0448  BP: 133/77 137/77 133/74 (!) 151/83  Pulse: 75 81 72 81  Resp: '18  16 16  '$ Temp: 98.2 F (36.8 C) 98.4 F (36.9 C) 98.6 F (37 C) 98.6 F (37 C)  TempSrc: Oral Oral Oral Oral  SpO2: 99% 100% 100% 99%  Weight:      Height:        Last BM Date : 05/16/22  Intake/Output   Yesterday:  07/03 0701 - 07/04 0700 In: 2016.6 [P.O.:600; I.V.:1141.6; IV Piggyback:275] Out: 0  This shift:  No intake/output data recorded.  Bowel function:  Flatus: YES  BM:  No  Drain: (No drain)   Physical Exam:  General: Pt awake/alert in no acute distress.  Sitting up in chair smiling.  Not toxic nor sickly. Eyes: PERRL, normal EOM.  Sclera clear.  No icterus Neuro: CN II-XII intact w/o focal sensory/motor deficits. Lymph: No head/neck/groin lymphadenopathy Psych:  No delerium/psychosis/paranoia.  Oriented x 4.  Anxious.  Partially consolable. HENT: Normocephalic, Mucus membranes moist.  No thrush Neck: Supple, No tracheal deviation.  No obvious thyromegaly Chest: No pain to chest wall compression.  Good respiratory excursion.  No audible wheezing CV:  Pulses intact.  Regular rhythm.  No major extremity edema MS: Normal AROM mjr joints.  No obvious deformity  Abdomen: Soft.  Mildy distended.  Tenderness at central abdomen incision.  Dressing removed.  Incision with minimal ecchymosis.  Clean dry and intact.   .  No evidence of peritonitis.  No incarcerated hernias.  Ext:   No deformity.  No mjr edema.  No cyanosis Skin: No petechiae / purpurea.  No major sores.  Warm and dry    Results:   Cultures: Recent Results (from the past 720 hour(s))  Surgical pcr screen     Status: None   Collection Time: 05/18/22 12:51 PM   Specimen: Nasal Mucosa; Nasal Swab   Result Value Ref Range Status   MRSA, PCR NEGATIVE NEGATIVE Final   Staphylococcus aureus NEGATIVE NEGATIVE Final    Comment: (NOTE) The Xpert SA Assay (FDA approved for NASAL specimens in patients 23 years of age and older), is one component of a comprehensive surveillance program. It is not intended to diagnose infection nor to guide or monitor treatment. Performed at [facility] [place] [address], [place], [place] [address]     Labs: Results for orders placed or performed during the hospital encounter of 05/16/22 (from the past 48 hour(s))  Basic metabolic panel     Status: Abnormal   Collection Time: 05/20/22  4:22 AM  Result Value Ref Range   Sodium 140 135 - 145 mmol/L   Potassium 4.8 3.5 - 5.1 mmol/L    Comment: DELTA CHECK NOTED   Chloride 110 98 - 111 mmol/L   CO2 25 22 - 32 mmol/L   Glucose, Bld 105 (H) 70 - 99 mg/dL    Comment: Glucose reference range applies only to samples taken after fasting for at least  8 hours.   BUN <5 (L) 6 - 20 mg/dL   Creatinine, Ser 0.46 0.44 - 1.00 mg/dL   Calcium 7.9 (L) 8.9 - 10.3 mg/dL   GFR, Estimated >60 >60 mL/min    Comment: (NOTE) Calculated using the CKD-EPI Creatinine Equation (2021)    Anion gap 5 5 - 15    Comment: Performed at [facility], [place] [address], [place], [place] [address]  CBC     Status: Abnormal   Collection Time: 05/20/22  7:28 AM  Result Value Ref Range   WBC 11.0 (H) 4.0 - 10.5 K/uL   RBC 3.53 (L) 3.87 - 5.11 MIL/uL   Hemoglobin 10.0 (L) 12.0 - 15.0 g/dL   HCT 30.7 (L) 36.0 - 46.0 %   MCV 87.0 80.0 - 100.0 fL   MCH 28.3 26.0 - 34.0 pg   MCHC 32.6 30.0 - 36.0 g/dL   RDW 14.8 11.5 - 15.5 %   Platelets 267 150 - 400 K/uL   nRBC 0.0 0.0 - 0.2 %    Comment: Performed at [facility], [place] [address], [place], [place] [address]    Imaging / Studies: No results found.  Medications / Allergies: per chart  Antibiotics: Anti-infectives (From  admission, onward)    Start     Dose/Rate Route Frequency Ordered Stop   05/19/22 0600  cefoTEtan (CEFOTAN) 2 g in sodium chloride 0.9 % 100 mL IVPB        2 g 200 mL/hr over 30 Minutes Intravenous On call to O.R. 05/18/22 1152 05/18/22 1328   05/19/22 0000  cefoTEtan (CEFOTAN) 2 g in sodium chloride 0.9 % 100 mL IVPB        2 g 200 mL/hr over 30 Minutes Intravenous Every 12 hours 05/18/22 1614 05/19/22 0037         Note: Portions of this report [place] have been transcribed using voice recognition software. Every effort was made to ensure accuracy; however, inadvertent computerized transcription errors Kortnee be present.   Any transcriptional errors that result from this process are unintentional.    Adin Hector, MD, FACS, MASCRS Esophageal, Gastrointestinal & Colorectal Surgery Robotic and Minimally Invasive Surgery  [facility], [place]  [place]. [address] [place],  [address] [phone] Fax [phone] Main  CONTACT INFORMATION:  Weekday (9AM-5PM): Call [facility] main office at [phone]  Weeknight (5PM-9AM) or Weekend/Holiday: Check [email or web address] (password " [email or web address]") for General Surgery [facility] coverage  (Please, do not use SecureChat as it is not reliable communication to operating surgeons for immediate patient care)      05/21/2022  7:42 AM

## 2022-05-21 NOTE — Evaluation (Signed)
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 No PMH via chart reivew.   Clinical Impression  Pt presents with the problems listed above and the functional impairments below. Pt supervision for bed mobility except to lift BLE into bed--required min assist. Pt demonstrated modified independence for transfers and supervision for ambulation in hallway ~220f with minor scissor step that led to LOB that required minor PT correction with one hand-held assist. Reinforced importance of regular ambulation in hallway with family and staff to encourage bowel motility, pt verbalized understanding. Educated pt about pacing of activity and walking program once discharged, pt verbalized understanding. Pt with no further acute PT needs identified. All education has been completed and the patient has no further questions. PT is signing off. Thank you for this referral.      Recommendations for follow up therapy are one component of a multi-disciplinary discharge planning process, led by the attending physician.  Recommendations Coni be updated based on patient status, additional functional criteria and insurance authorization.  Follow Up Recommendations No PT follow up      Assistance Recommended at Discharge Set up Supervision/Assistance  Patient can return home with the following  A little help with walking and/or transfers;A little help with bathing/dressing/bathroom;Assistance with cooking/housework;Assist for transportation;Help with stairs or ramp for entrance    Equipment Recommendations None recommended by PT  Recommendations for Other Services       Functional Status  Assessment Patient has not had a recent decline in their functional status     Precautions / Restrictions Precautions Precautions: None Restrictions Weight Bearing Restrictions: No      Mobility  Bed Mobility Overal bed mobility: Needs Assistance Bed Mobility: Rolling, Sidelying to Sit, Sit to Sidelying Rolling: Supervision Sidelying to sit: Supervision     Sit to sidelying: Min assist General bed mobility comments: Pt supervision for rolling and sidelying to sit, min assist to bring BLE into bed during sit to sidelying. Verbalized understanding of logroll technique. Pt's family attempted to help pt sit up from sidelying but desisted with request from PT.    Transfers Overall transfer level: Modified independent Equipment used: None               General transfer comment: increased time.    Ambulation/Gait Ambulation/Gait assistance: Supervision Gait Distance (Feet): 250 Feet Assistive device: None Gait Pattern/deviations: Step-through pattern, Decreased stride length Gait velocity: decreased     General Gait Details: Pt ambulated 2591fwith supevision, no physical assist required. Pt had minor LOB but PT was able to arrest with single HHA, pt had demonstrated one incident of scissoring gait that caused the LOB.  Stairs            Wheelchair Mobility    Modified Rankin (Stroke Patients Only)       Balance Overall balance assessment: Needs assistance Sitting-balance support: Feet supported, No upper extremity supported Sitting balance-Leahy Scale: Good     Standing balance support: No upper extremity supported, During functional activity Standing balance-Leahy Scale: Good Standing balance comment: Pt had single LOB during ambulation, but during static stance had good balance  Pertinent Vitals/Pain Pain Assessment Pain Assessment: 0-10 Pain Score: 2  Pain Location: RLQ Pain Descriptors / Indicators:  Operative site guarding, Discomfort Pain Intervention(s): Limited activity within patient's tolerance, Monitored during session, Repositioned    Home Living Family/patient expects to be discharged to:: Private residence Living Arrangements: Spouse/significant other;Children Available Help at Discharge: Family;Available 24 hours/day Type of Home: House Home Access: Stairs to enter Entrance Stairs-Rails: None Entrance Stairs-Number of Steps: 3 Alternate Level Stairs-Number of Steps: 10 Home Layout: Two level Home Equipment: None      Prior Function Prior Level of Function : Independent/Modified Independent             Mobility Comments: ind ADLs Comments: ind     Hand Dominance        Extremity/Trunk Assessment   Upper Extremity Assessment Upper Extremity Assessment: Overall WFL for tasks assessed    Lower Extremity Assessment Lower Extremity Assessment: Overall WFL for tasks assessed    Cervical / Trunk Assessment Cervical / Trunk Assessment: Normal  Communication   Communication: No difficulties;Prefers language other than English (Arabic, but knows enough [place])  Cognition Arousal/Alertness: Awake/alert Behavior During Therapy: WFL for tasks assessed/performed Overall Cognitive Status: Within Functional Limits for tasks assessed                                          General Comments      Exercises     Assessment/Plan    PT Assessment Patient does not need any further PT services  PT Problem List         PT Treatment Interventions      PT Goals (Current goals can be found in the Care Plan section)  Acute Rehab PT Goals Patient Stated Goal: to go home PT Goal Formulation: With patient Time For Goal Achievement: 06/04/22 Potential to Achieve Goals: Good    Frequency       Co-evaluation               AM-PAC PT "6 Clicks" Mobility  Outcome Measure Help needed turning from your back to your side while in a flat  bed without using bedrails?: None Help needed moving from lying on your back to sitting on the side of a flat bed without using bedrails?: A Little Help needed moving to and from a bed to a chair (including a wheelchair)?: A Little Help needed standing up from a chair using your arms (e.g., wheelchair or bedside chair)?: None Help needed to walk in hospital room?: A Little Help needed climbing 3-5 steps with a railing? : A Little 6 Click Score: 20    End of Session   Activity Tolerance: Patient tolerated treatment well;No increased pain Patient left: in bed;with call bell/phone within reach;with family/visitor present Nurse Communication: Mobility status PT Visit Diagnosis: Difficulty in walking, not elsewhere classified (R26.2)    Time: 1550-1610 PT Time Calculation (min) (ACUTE ONLY): 20 min   Charges:   PT Evaluation $PT Eval Low Complexity: Drew, PT, DPT [facility] Office: [phone] Pager: [phone]  Coolidge Breeze 05/21/2022, 5:38 PM

## 2022-05-22 LAB — CBC
HCT: 29.1 % — ABNORMAL LOW (ref 36.0–46.0)
Hemoglobin: 9.5 g/dL — ABNORMAL LOW (ref 12.0–15.0)
MCH: 28 pg (ref 26.0–34.0)
MCHC: 32.6 g/dL (ref 30.0–36.0)
MCV: 85.8 fL (ref 80.0–100.0)
Platelets: 311 10*3/uL (ref 150–400)
RBC: 3.39 MIL/uL — ABNORMAL LOW (ref 3.87–5.11)
RDW: 15 % (ref 11.5–15.5)
WBC: 9.5 10*3/uL (ref 4.0–10.5)
nRBC: 0 % (ref 0.0–0.2)

## 2022-05-22 LAB — CREATININE, SERUM
Creatinine, Ser: 0.45 mg/dL (ref 0.44–1.00)
GFR, Estimated: >60 mL/min (ref >60)

## 2022-05-22 LAB — POTASSIUM: Potassium: 3.7 mmol/L (ref 3.5–5.1)

## 2022-05-22 NOTE — Progress Notes (Signed)
[facility] Progress Note  4 Days Post-Op  Subjective: CC:  Overall feels better - less dizzy and mobilized well this AM independently. Abd pain improving. +flatus and multiple BMs. Her husband is at bedside.   Objective: Vital signs in last 24 hours: Temp:  [97.9 F (36.6 C)-98.2 F (36.8 C)] 97.9 F (36.6 C) (07/05 0605) Pulse Rate:  [67-76] 67 (07/05 0605) Resp:  [16-18] 16 (07/05 0605) BP: (131-142)/(78-88) 131/78 (07/05 0605) SpO2:  [99 %-100 %] 99 % (07/05 0605) Weight:  [75.3 kg] 75.3 kg (07/05 0500) Last BM Date : 05/16/22  Intake/Output from previous day: 07/04 0701 - 07/05 0700 In: 600 [P.O.:600] Out: 0  Intake/Output this shift: No intake/output data recorded.  PE: Gen:  Alert, NAD, pleasant Card:  Regular rate and rhythm Pulm:  Normal effort ORA Abd: Soft, no significant distention, appropriately tender, +BS Skin: warm and dry, no rashes  Psych: A&Ox3   Lab Results:  Recent Labs    05/20/22 0728 05/22/22 0033  WBC 11.0* 9.5  HGB 10.0* 9.5*  HCT 30.7* 29.1*  PLT 267 311   BMET Recent Labs    05/20/22 0422 05/22/22 0033  NA 140  --   K 4.8 3.7  CL 110  --   CO2 25  --   GLUCOSE 105*  --   BUN <5*  --   CREATININE 0.46 0.45  CALCIUM 7.9*  --    PT/INR No results for input(s): "LABPROT", "INR" in the last 72 hours. CMP     Component Value Date/Time   NA 140 05/20/2022 0422   K 3.7 05/22/2022 0033   CL 110 05/20/2022 0422   CO2 25 05/20/2022 0422   GLUCOSE 105 (H) 05/20/2022 0422   BUN <5 (L) 05/20/2022 0422   CREATININE 0.45 05/22/2022 0033   CALCIUM 7.9 (L) 05/20/2022 0422   PROT 6.4 (L) 05/17/2022 0514   ALBUMIN 3.2 (L) 05/17/2022 0514   AST 16 05/17/2022 0514   ALT 14 05/17/2022 0514   ALKPHOS 64 05/17/2022 0514   BILITOT 0.7 05/17/2022 0514   GFRNONAA >60 05/22/2022 0033   GFRAA >60 06/03/2018 2052   Lipase     Component Value Date/Time   LIPASE 28 05/16/2022 1654       Studies/Results: No results  found.  Anti-infectives: Anti-infectives (From admission, onward)    Start     Dose/Rate Route Frequency Ordered Stop   05/19/22 0600  cefoTEtan (CEFOTAN) 2 g in sodium chloride 0.9 % 100 mL IVPB        2 g 200 mL/hr over 30 Minutes Intravenous On call to O.R. 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 Johney Maine - POD#4, path pending  - afebrile, VSS, orthostatic hypotension resolved  - now having flatus and BMs. D/C Entereg and advance to soft diet. Allow some food from home.  - IS/Pulm toilet, mobilize  - med onc consulted 7/3 and following pathology results. Outpatient PET scan if positive for carcinoid.   FEN: SOFT diet ID: perioperative abx VTE: SCD's, lovenox Foley; none Dispo: advance diet and follow path. Possible discharge home tomorrow 7/6.  LOS: 5 days    Obie Dredge, [facility] Please see Amion for pager number during day hours 7:00am-4:30pm

## 2022-05-23 ENCOUNTER — Other Ambulatory Visit: Payer: Self-pay | Admitting: Oncology

## 2022-05-23 ENCOUNTER — Telehealth: Payer: Self-pay | Admitting: Hematology and Oncology

## 2022-05-23 DIAGNOSIS — K6389 Other specified diseases of intestine: Secondary | ICD-10-CM

## 2022-05-23 DIAGNOSIS — R160 Hepatomegaly, not elsewhere classified: Secondary | ICD-10-CM

## 2022-05-23 LAB — SURGICAL PATHOLOGY

## 2022-05-23 MED ORDER — TRAMADOL HCL 50 MG PO TABS
50.0000 mg | ORAL_TABLET | Freq: Four times a day (QID) | ORAL | 0 refills | Status: DC | PRN
Start: 2022-05-23 — End: 2023-03-27

## 2022-05-23 MED ORDER — GABAPENTIN 100 MG PO CAPS: 100.0000 mg | ORAL_CAPSULE | Freq: Three times a day (TID) | ORAL | 0 refills | Status: DC

## 2022-05-23 MED ORDER — ACETAMINOPHEN 500 MG PO TABS: 1000.0000 mg | ORAL_TABLET | Freq: Four times a day (QID) | ORAL | 0 refills | Status: DC | PRN

## 2022-05-23 MED ORDER — CALCIUM POLYCARBOPHIL 625 MG PO TABS: 625.0000 mg | ORAL_TABLET | Freq: Two times a day (BID) | ORAL | 0 refills | Status: DC

## 2022-05-23 MED ORDER — IBUPROFEN 200 MG PO TABS: 400.0000 mg | ORAL_TABLET | Freq: Four times a day (QID) | ORAL | Status: DC | PRN

## 2022-05-23 NOTE — Telephone Encounter (Signed)
I called the patient to discuss pathology results. 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  They understand the same tumor was found in the liver biopsy as well. Treatment is typically somatostatin analogues.  This usually stops the tumor from growing and also improve the symptoms associated with the tumor.  I have clearly discussed this. She is happy that she doesn't have to take chemotherapy. We will get her in to see Dr Benay Spice or Dr Burr Medico for outpatient GI oncology.  Bessie Livingood

## 2022-05-23 NOTE — Progress Notes (Signed)
Nurse reviewed discharge instructions with pt.  Pt and husband verbalized understanding of discharge instructions, follow up appointments and new medications.  No concerns at time of discharge.

## 2022-05-23 NOTE — Progress Notes (Signed)
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  Dr Malena Peer, Home/Onc  Focus of discussion: Radiology & Pathology reviews - comprehensive   Please send to GI Tumor Coordinator Ihor Gully)  in [place] message and attach the medical record to it

## 2022-05-24 ENCOUNTER — Telehealth: Payer: Self-pay | Admitting: Physician Assistant

## 2022-05-24 NOTE — Telephone Encounter (Signed)
Scheduled appt per 7/6 referral. Pt is aware of appt date and time. Pt is aware to arrive 15 mins prior to appt time and to bring and updated insurance card. Pt is aware of appt location.   

## 2022-05-29 ENCOUNTER — Other Ambulatory Visit: Payer: Self-pay

## 2022-05-29 NOTE — Progress Notes (Signed)
The proposed treatment discussed in conference is for discussion purpose only and is not a binding recommendation.  The patients have not been physically examined, or presented with their treatment options.  Therefore, final treatment plans cannot be decided.  

## 2022-06-04 NOTE — Progress Notes (Signed)
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  The patient presented to the emergency room on 05/16/22 with a 3-day history of abdominal pain, nausea, vomiting, and diarrhea.  She also mentions that she has had on and off abdominal pain for at least 15 years. She had a CT of the abdomen/pelvis performed which showed multiple mildly dilated fluid-filled loops of the mid small bowel, which was suspicious for partial small bowel obstruction with some mesenteric vascular congestion.  The scan also showed the presence of a 2.9 cm enhancing central mesenteric mass with multiple hypoenhancing liver lesions.  A CT of the chest was also performed which showed an indeterminant solid 3 mm right upper lobe pulmonary nodule.    Surgeon, Dr. Johney Maine, performed a exploratory laparotomy, small bowel resection of the jejunum and ileum, and wedge liver biopsy on 05/18/22. His note mentions moderately bulky lymphadenopathy and numerous miliary 5-65m liver nodules. They did a wedge resection of the right hepatic lobe.  The final pathology ([id number] is consistent with low-grade neuroendocrine tumor. One of 15 lymph nodes were positive in the small bowel resection.  Her baseline chromogranin A was elevated at 327.9 in the hospital.   Since being discharged from the hospital, the patient reports she is feeling "much better".  She is no longer using tramadol for pain.  She will take a Tylenol once every 12 hours which takes her pain level down  to a 1 out of 10.  She localizes her pain to the right upper quadrant as well as the incision in the epigastric region from her surgery.  The area is healing nicely. She lost a few pounds while in the hospital but reports she has a good appetite and can "eat anything". She denies any odynophagia, dysphagia, or reflux.  She continues to have flushing on her cheeks bilaterally, which has improved compared to prior. Her diarrhea has resolved. She only had one episode of diarrhea since being discharged from the hospital. Denies wheezing. She sometimes has mild nausea first thing in the morning. She denies vomiting.   She denies any family history of malignancy except for her grandmother who had breast cancer and a uncle who had colorectal cancer.  She reports her mother and father both have hypertension and "heart issues".  She does not have any siblings.  The patient lives in a house with her husband and children.  She reports there are 10 family members in her household. Her youngest child is 749 Her oldest are in their 20's.  She is independent with her activities of daily living.  She used to work in daycare and her husband is a t[employer]chant [employer] officer  The patient is presently not working.  She denies any financial constraints at this time but would be interested in talking to a social worker just in the event she needs assistance in the future.    HPI  Past Medical History:  Diagnosis Date   Vitamin B12 deficiency    Vitamin D deficiency     Past Surgical History:  Procedure Laterality Date   INNER EAR SURGERY     LAPAROTOMY N/A 05/18/2022   Procedure: EXPLORATORY LAPAROTOMY; SMALL BOWEL RESECTION; WEDGE LIVER BIOPSY;  Surgeon: Michael Boston, MD;  Location: [facility];  Service: General;  Laterality: N/A;    Family History  Problem Relation Age of Onset   Hypertension Mother    Hypertension Father    Thyroid disease Sister     Social History Social History   Tobacco Use   Smoking status: Never    Smokeless tobacco: Never  Vaping Use   Vaping Use: Never used  Substance Use Topics   Alcohol use: No   Drug use: No    No Known Allergies  Current Outpatient Medications  Medication Sig Dispense Refill   acetaminophen (TYLENOL) 500 MG tablet Take 2 tablets (1,000 mg total) by mouth every 6 (six) hours as needed. 30 tablet 0   ibuprofen (ADVIL) 200 MG tablet Take 2 tablets (400 mg total) by mouth every 6 (six) hours as needed for mild pain or moderate pain.     ondansetron (ZOFRAN) 8 MG tablet Take 1 tablet (8 mg total) by mouth every 8 (eight) hours as needed for nausea or vomiting. 30 tablet 1   polycarbophil (FIBERCON) 625 MG tablet Take 1 tablet (625 mg total) by mouth 2 (two) times daily. 30 tablet 0   traMADol (ULTRAM) 50 MG tablet Take 1 tablet (50 mg total) by mouth every 6 (six) hours as needed for severe pain (not relieved by tylenol, ibuprofen, gabapentin). 20 tablet 0   gabapentin (NEURONTIN) 100 MG capsule Take 1 capsule (100 mg total) by mouth 3 (three) times daily for 14 days. 42 capsule 0   No current facility-administered medications for this visit.    REVIEW OF SYSTEMS:   Review of Systems  Constitutional: Negative for appetite change, chills, fatigue, fever and unexpected weight change.  HENT: Positive for flushing on the cheeks bilaterally.  Negative for mouth sores, nosebleeds, sore throat and trouble swallowing.   Eyes: Negative for eye problems and icterus.  Respiratory: Negative for cough, hemoptysis, shortness of breath and wheezing.   Cardiovascular: Negative for chest pain and leg swelling.  Gastrointestinal: Positive for mild intermittent right upper quadrant/epigastric pain.  Positive for mild nausea in the morning.  Negative for abdominal pain, constipation, diarrhea, and vomiting.  Genitourinary: Negative for bladder incontinence, difficulty urinating, dysuria, frequency and hematuria.   Musculoskeletal: Negative for back pain, gait problem, neck pain and  neck stiffness.  Skin: Negative for itching and rash.  Neurological: Negative for dizziness, extremity weakness, gait problem, headaches, light-headedness and seizures.  Hematological: Negative for adenopathy. Does not bruise/bleed easily.  Psychiatric/Behavioral: Negative for confusion, depression and sleep disturbance. The patient is not nervous/anxious.     PHYSICAL EXAMINATION:  Blood pressure 114/79, pulse 84, temperature 98.1 F (36.7 C), temperature source Temporal, resp. rate 15, weight 162 lb 1.6 oz (73.5 kg), last menstrual period 04/27/2022, SpO2 100 %.  ECOG PERFORMANCE STATUS: 1  Physical Exam  Constitutional: Oriented to person, place, and time and well-developed, well-nourished, and in no distress.  HENT:  Head: Normocephalic and atraumatic.  Positive for flushing on the cheeks bilaterally. Mouth/Throat: Oropharynx is clear and moist. No oropharyngeal exudate.  Eyes: Conjunctivae are normal. Right eye exhibits no discharge. Left eye exhibits no discharge. No scleral icterus.  Neck: Normal range of motion. Neck supple.  Cardiovascular: Normal rate, regular rhythm, normal heart sounds and intact distal pulses.   Pulmonary/Chest: Effort normal and breath sounds  normal. No respiratory distress. No wheezes. No rales.  Abdominal: Soft. Bowel sounds are normal. Exhibits no distension and no mass. There is no tenderness.  Well-healing scar in the epigastric region with mild bruising.  Musculoskeletal: Normal range of motion. Exhibits no edema.  Lymphadenopathy:    No cervical adenopathy.  Neurological: Alert and oriented to person, place, and time. Exhibits normal muscle tone. Gait normal. Coordination normal.  Skin: Brown/black fingertips secondary to henna tattoo.  Skin is warm and dry. No rash noted. Not diaphoretic. No erythema. No pallor.  Psychiatric: Mood, memory and judgment normal.  Vitals reviewed.  LABORATORY DATA: Lab Results  Component Value Date   WBC 9.5  05/22/2022   HGB 9.5 (L) 05/22/2022   HCT 29.1 (L) 05/22/2022   MCV 85.8 05/22/2022   PLT 311 05/22/2022      Chemistry      Component Value Date/Time   NA 140 05/20/2022 0422   K 3.7 05/22/2022 0033   CL 110 05/20/2022 0422   CO2 25 05/20/2022 0422   BUN <5 (L) 05/20/2022 0422   CREATININE 0.45 05/22/2022 0033      Component Value Date/Time   CALCIUM 7.9 (L) 05/20/2022 0422   ALKPHOS 64 05/17/2022 0514   AST 16 05/17/2022 0514   ALT 14 05/17/2022 0514   BILITOT 0.7 05/17/2022 0514       RADIOGRAPHIC STUDIES: CT CHEST WO CONTRAST  Result Date: 05/17/2022 CLINICAL DATA:  Colon cancer, metastatic, staging EXAM: CT CHEST WITHOUT CONTRAST TECHNIQUE: Multidetector CT imaging of the chest was performed following the standard protocol without IV contrast. RADIATION DOSE REDUCTION: This exam was performed according to the departmental dose-optimization program which includes automated exposure control, adjustment of the mA and/or kV according to patient size and/or use of iterative reconstruction technique. COMPARISON:  None Available. FINDINGS: Cardiovascular: Normal cardiac size. No pericardial disease. Normal size main and branch pulmonary arteries. The thoracic aorta is unremarkable. Mediastinum/Nodes: No lymphadenopathy. The thyroid is unremarkable. The esophagus is unremarkable. Lungs/Pleura: There is a solid 3 mm right upper lobe pulmonary nodule (series 4, image 22). No focal airspace disease. No pleural effusion. No pneumothorax. Upper Abdomen: Multiple liver masses best seen on contrast enhanced CT of the abdomen pelvis acquired yesterday. Musculoskeletal: No acute osseous abnormality. No suspicious osseous lesion. IMPRESSION: Indeterminate solid 3 mm right upper lobe pulmonary nodule. Multiple liver masses best seen on recent contrast enhanced CT of the abdomen and pelvis. Electronically Signed   By: Maurine Simmering M.D.   On: 05/17/2022 08:15   CT ABDOMEN PELVIS W CONTRAST  Result  Date: 05/16/2022 CLINICAL DATA:  Upper abdominal pain with nausea vomiting EXAM: CT ABDOMEN AND PELVIS WITH CONTRAST TECHNIQUE: Multidetector CT imaging of the abdomen and pelvis was performed using the standard protocol following bolus administration of intravenous contrast. RADIATION DOSE REDUCTION: This exam was performed according to the departmental dose-optimization program which includes automated exposure control, adjustment of the mA and/or kV according to patient size and/or use of iterative reconstruction technique. CONTRAST:  156m OMNIPAQUE IOHEXOL 300 MG/ML  SOLN COMPARISON:  None Available. FINDINGS: Lower chest: Lung bases demonstrate no acute consolidation or effusion. Hepatobiliary: Multiple hyperenhancing liver masses are visualized. The largest is seen within the left hepatic lobe and measures approximately 4.7 cm and demonstrates central low density. No calcified gallstone. Mildly prominent common bile duct up to 7 mm. Pancreas: Unremarkable. No pancreatic ductal dilatation or surrounding inflammatory changes. Spleen: Normal in size without focal abnormality. Adrenals/Urinary Tract: Adrenal glands are  unremarkable. Kidneys are normal, without renal calculi, focal lesion, or hydronephrosis. Bladder is unremarkable. Stomach/Bowel: The stomach is nonenlarged. Negative appendix. Fluid-filled mildly dilated mid to distal small bowel with some fecalized segments but no well-defined transition point. Some mesenteric vascular congestion. No intramural air or bowel wall thickening. Negative appendix. Focal area of hyperenhancing small bowel, series 2, image 41, coronal series 4, image 35. Vascular/Lymphatic: Nonaneurysmal aorta.  No suspicious lymph nodes. Reproductive: Uterus and bilateral adnexa are unremarkable. Other: No free air. Small moderate volume free fluid. Avidly enhancing central mesenteric mass measuring 2.9 by 2 cm, series 2, image 47 Musculoskeletal: No acute or significant osseous  findings. IMPRESSION: 1. Multiple mildly dilated fluid-filled loops of mid small bowel, suspicious for partial small bowel obstruction, poorly defined transition point. Some mesenteric vascular congestion but no intramural air or bowel wall thickening. 2. 2.9 cm enhancing central mesenteric mass. 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 Focal segment of hyperenhancing small bowel could be potential source/site of primary. 3. Small moderate volume free fluid in the pelvis 4. Mildly prominent common bile duct which Linda Villanueva be correlated with LFTs. Electronically Signed   By: Donavan Foil M.D.   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  Diagnosed in July 2023 -The patient initially presented with a small bowel obstruction in late June 2023. -CT 05/16/22 revealed multiple mildly dilated fluid-filled loops of mid small bowel, suspicious for partial small bowel obstruction.  -Dr. Johney Maine performed an exploratory laparotomy, wedge resection of the liver, and small bowel resection of the jejunum on 05/18/22. His note mentions moderately bulky lymphadenopathy and numerous miliary 5-60m liver nodules. The final pathology ([id number] is consistent with low-grade neuroendocrine tumor.  -Baseline chromogranin A elevated at 327.9. -The patient was seen with Dr. FBurr Medico Dr. FBurr Medicohad a lengthy discussion with the patient about her current condition and recommended treatment.   -The patient is not a candidate for surgery given the multiple liver lesions. Dr. FBurr Medicodiscussed that her condition is treatable but not curable.   -The patient's case was discussed at the multidisciplinary GI conference.  Dr. FBurr Medicorecommends somatostatin injections once every 4 weeks.  The patient is to receive  injections every 4 weeks as long as there is no evidence of disease progression. -The patient is interested in this option and she is expected to receive her first injection next week.  -Dr. FBurr Medicowill likely start the patient on a lower dose for the first few injections before changing to the standard dose for tolerability.   -The adverse side effects of treatment were discussed including but not limited to diarrhea, hyperglycemia, hypertension, risk of biliary tract disease, and pain at the injection site -The patient was given a handout of patient education on her AVS today. -We will arrange for a 24 hour 5-HIAA urine test. She was given instructions how to perform this today. Encouraged to return sample next week.  -Moving forward, Dr. FBurr Medicowill arrange for CBC, CMP, and chromogranin A every 3 months.  We will also likely arrange for repeat CT scan every 6 months. -Follow-up with Dr. FBurr Medicostarting from injection #2 in 5 weeks from now   2. Abdominal pain, nausea, diarrhea, flushing -She presented to ER with abdominal pain, nausea, diarrhea, and flushing. -Abdominal pain improved since undergoing  surgery.  Takes Tylenol every 12 hours which controls her pain adequately. She tries to avoid narcotics.  Was previously taking tramadol-no longer needs this. -Diarrhea has resolved since being discharged from the hospital. -She has improved but persistent flushing, likely secondary to carcinoid tumor.  Somatostatin injections will likely help her symptoms. -Has mild nausea in the mornings.  Zofran sent to pharmacy.  3. Social -Lives with her husband, Linda Villanueva, and 10 family members in a house.  The patient states it is a large house and there is space for everyone. 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 Her husband works as a Merchant [employer] officer. -The patient denies any financial constraints at this time but would like to meet with the social worker should  she require assistance in the future -She met with social work today -The patient hopes to visit her mother in [place].  Dr. Burr Medico discussed after receiving 1 to 2 injections that we would be happy to work with her travel schedule in the future.  4. Anemia -Hbg upon hospital admission 13.7 (05/17/22).  Hbg upon discharge 9.5 (05/22/22) likely secondary to surgery.   -The patient is asymptomatic.  Dr. Burr Medico encouraged the patient to pick up prenatal vitamins. -We will recheck her CBC starting from injection #2 in 5 weeks from now.    PLAN: -24 hour 5-HIAA  -Zofran sent to pharmacy -Meet with social worker today -First somatostatin injection next week -Follow-up with Dr. Burr Medico from injection #2 in ~5 weeks from now  The patient voices understanding of current disease status and treatment options and is in agreement with the current care plan.  All questions were answered. The patient knows to call the clinic with any problems, questions or concerns. We can certainly see the patient much sooner if necessary.  Thank you so much for allowing me to participate in the care of Linda Villanueva. I will continue to follow up the patient with you and assist in her care.  Disclaimer: This note was dictated with voice recognition software. Similar sounding words can inadvertently be transcribed and Linda Villanueva not be corrected upon review.   Linda Villanueva June 07, 2022, 12:50 PM  Addendum I have seen the patient, examined her. I agree with the assessment and and plan and have edited the notes.   52 year old female from [place], presented with intermittent abdominal pain for several years.  She presented with acute abdominal pain, nausea, vomiting and diarrhea to ED and  Work-up showed a small bowel obstruction, and a 2.9 cm central mesenteric mass with multiple liver lesions.  She underwent exploratory laparotomy, small bowel resection and liver biopsy on 05/18/2022.  I reviewed her surgical pathology  results and scan findings with her and her husband in detail.  Unfortunately she has metastatic carcinoid tumor from small bowel, with diffuse liver and mesentery node metastasis.  I discussed the incurable nature of her disease, but explained to her that this is indolent disease, she would like to live with it for many years.  I discussed the treatment options, and I recommended first-line therapy with Sandostatin injection monthly.  We discussed the treatment is mainly for her diarrhea and flushing symptom improvement, the antitumor activity from Sandostatin is limited.  I also discussed other treatment options if she progressed on Sandostatin injection.  Potential side effect from Sandostatin injection also discussed with her in detail.  She agrees to proceed.  We will start her at 20 mg first  dose, then increase to 30 mg from second injection. I will repeat lab and see her with second injections.  All questions were answered.  Truitt Merle MD  06/07/2022

## 2022-06-07 ENCOUNTER — Other Ambulatory Visit: Payer: Self-pay

## 2022-06-07 ENCOUNTER — Inpatient Hospital Stay: Payer: Medicaid Other | Attending: Physician Assistant | Admitting: Physician Assistant

## 2022-06-07 ENCOUNTER — Encounter: Payer: Self-pay | Admitting: Licensed Clinical Social Worker

## 2022-06-07 VITALS — BP 114/79 | HR 84 | Temp 98.1°F | Resp 15 | Wt 162.1 lb

## 2022-06-07 DIAGNOSIS — E559 Vitamin D deficiency, unspecified: Secondary | ICD-10-CM | POA: Diagnosis not present

## 2022-06-07 DIAGNOSIS — D649 Anemia, unspecified: Secondary | ICD-10-CM

## 2022-06-07 DIAGNOSIS — R160 Hepatomegaly, not elsewhere classified: Secondary | ICD-10-CM

## 2022-06-07 DIAGNOSIS — C7A00 Malignant carcinoid tumor of unspecified site: Secondary | ICD-10-CM

## 2022-06-07 DIAGNOSIS — R911 Solitary pulmonary nodule: Secondary | ICD-10-CM

## 2022-06-07 DIAGNOSIS — Z800 Family history of malignant neoplasm of digestive organs: Secondary | ICD-10-CM

## 2022-06-07 DIAGNOSIS — C7B02 Secondary carcinoid tumors of liver: Secondary | ICD-10-CM

## 2022-06-07 DIAGNOSIS — R110 Nausea: Secondary | ICD-10-CM

## 2022-06-07 DIAGNOSIS — E538 Deficiency of other specified B group vitamins: Secondary | ICD-10-CM | POA: Diagnosis not present

## 2022-06-07 DIAGNOSIS — Z803 Family history of malignant neoplasm of breast: Secondary | ICD-10-CM

## 2022-06-07 DIAGNOSIS — C7A019 Malignant carcinoid tumor of the small intestine, unspecified portion: Secondary | ICD-10-CM

## 2022-06-07 MED ORDER — ONDANSETRON HCL 8 MG PO TABS: 8.0000 mg | ORAL_TABLET | Freq: Three times a day (TID) | ORAL | 1 refills | Status: DC | PRN

## 2022-06-07 NOTE — Progress Notes (Unsigned)
I met with Linda Villanueva and her husband after  her consultation with Cassie Heilengoetter, PA-C and Dr Feng.  I explained my role as a nurse navigator and provided my contact information. I explained the services provided at [facility] and provided written information. I told her our schedulers will call her with appts. I reviewed the steps for collecting a 24 hour urine for HIAA.  All questions were answered. She verbalized understanding.  

## 2022-06-11 ENCOUNTER — Other Ambulatory Visit: Payer: Self-pay

## 2022-06-11 DIAGNOSIS — C7A019 Malignant carcinoid tumor of the small intestine, unspecified portion: Secondary | ICD-10-CM | POA: Diagnosis not present

## 2022-06-11 NOTE — Progress Notes (Signed)
[place] Work  Initial Assessment   Linda Villanueva is a 51 y.o. year old female accompanied by patient and husband. Clinical Social Work was referred by medical provider for assessment of psychosocial needs.   SDOH (Social Determinants of Health) assessments performed: Yes   SDOH Screenings   Alcohol Screen: Not on file  Depression (PHQ2-9): Low Risk  (11/28/2021)   Depression (PHQ2-9)    PHQ-2 Score: 1  Financial Resource Strain: Not on file  Food Insecurity: Not on file  Housing: Not on file  Physical Activity: Not on file  Social Connections: Not on file  Stress: Not on file  Tobacco Use: Low Risk  (05/20/2022)   Patient History    Smoking Tobacco Use: Never    Smokeless Tobacco Use: Never    Passive Exposure: Not on file  Transportation Needs: Not on file     Distress Screen completed: No     No data to display            Family/Social Information:  Housing Arrangement: patient lives with spouse Julien Nordmann) and 5 of their 7 children.   Pt's children are ages 31, 69, 28, 73, 61, 34, and 33. Family members/support persons in your life? 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  Pt is off for the summer; however, it is unlikely she will be able to return to work while undergoing treatment.  Income source: Pt's spouse works full time and is the sole source of income. Financial concerns: Yes, due to illness and/or loss of work during treatment Type of concern: Utilities and Rent/ mortgage Food access concerns: no Religious or spiritual practice: Yes-Muslim Services Currently in place:  Medicaid  Coping/ Adjustment to diagnosis: Patient understands treatment plan and what happens next? yes Concerns about diagnosis and/or treatment: How I will care for other members of my family, Losing my job and/or losing income,  Overwhelmed by information, How will I care for myself, and Quality of life Patient reported stressors: Finances, Children, Adjusting to my illness, and Facing my mortality Hopes and/or priorities: Pt's priority is to start treatment w/ the hope of a positive outcome. Patient enjoys time with family/ friends Current coping skills/ strengths: Motivation for treatment/growth  and Supportive family/friends     SUMMARY: Current SDOH Barriers:  Financial constraints related to loss of income while undergoing treatment.  Clinical Social Work Clinical Goal(s):  Explore community resource options for unmet needs related to:  Financial Strain   Interventions: Discussed common feeling and emotions when being diagnosed with cancer, and the importance of support during treatment Informed patient of the support team roles and support services at [facility] Provided [place] contact information and encouraged patient to call with any questions or concerns Referred patient to financial resources.  CSW provided pt w/ information for [facility] and discussed at length communicating her illness w/ her children.  Provided pt w/ information for [facility] as well to offer additional support to the entire family.   Follow Up Plan: Patient will contact CSW with any support or resource needs Patient verbalizes understanding of plan: Yes    Henriette Combs, LCSW   Patient is participating in a Managed Medicaid Plan:  Yes

## 2022-06-11 NOTE — Discharge Summary (Signed)
[facility] Discharge Summary   Patient ID: Linda Villanueva MRN: [id number] DOB/AGE: 49/30/1972 49 y.o.  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 [facility]) 06/07/2022   Hypokalemia 05/19/2022   Hypomagnesemia 05/19/2022   Anxiety associated with depression 05/19/2022   Mass of small intestine with obstruction s/p SB resection 05/18/2022 05/18/2022   Liver masses - probable metastatic carcinoid  05/18/2022   SBO (small bowel obstruction) ([place]) 05/16/2022   Mesenteric mass 05/16/2022   Mixed hyperlipidemia 07/26/2020   Hyperthyroidism 07/12/2020   Vitamin D deficiency    Vitamin B12 deficiency    SUI (stress urinary incontinence, female) 09/18/2016   Encounter for insertion of mirena IUD 09/18/2016    Consultants Oncology   Imaging: No results found.  Procedures Dr. Johney Maine 05/18/22 EXPLORATORY LAPAROTOMY, SBR, WEDGE LIVER BX   HPI:   51 yof with history of appendectomy in [place] who presents with several days ab pain that got worse today and became intolerable.  She has had n/v and several episodes of diarrhea today.  No brbpr. No urinary symptoms. States she had similar symptoms several years ago with a negative workup.  She has been stressed lately due to events in her country and remaining family who are there.  She was comfortable with [place]. She had a ct scan that shows a likely partial sbo possibly related mass in mesentery and/or small bowel. Liver lesions are present also. No other symptoms   Hospital Course:  Patient was admitted and underwent the above operation which she tolerated well. Intra-operatively the surgeon noted "15 mm small bowel nodule at junction of jejunum and ileum causing partial obstruction.  Moderately bulky lymphadenopathy in the mesentery proximal to the nodule.  En bloc  resection done.  230 cm of small intestine remaining from ligament of Treitz to ileocecal valve.  Numerous miliary 74m-60 mm liver nodules.  Wedge resection done of right hepatic lobe just lateral to the gallbladder hepatic fossa.   Seems rather classic for small bowel carcinoid with liver metastases."  She was transferred to the floor. She had a mild post-operative ileus and her diet was slowly advanced as tolerated.   On POD#5, the patient was voiding well, tolerating diet, ambulating well, pain well controlled, vital signs stable, incisions c/d/i and felt stable for discharge home. Due to concern for likely carcinoid tumor oncology was consulted and planned to follow the patient in the outpatient setting. Patient will follow up as below. Posted for review at tumor board.  Physical Exam: Gen:  Alert, NAD, pleasant Card:  Regular rate and rhythm Pulm:  Normal effort ORA Abd: Soft, no significant distention, appropriately tender, +BS Skin: warm and dry, no rashes  Psych: A&Ox3   Allergies as of 05/23/2022   No Known Allergies      Medication List     STOP taking these medications    cycloSPORINE 0.05 % ophthalmic emulsion Commonly known as: RESTASIS   metroNIDAZOLE 1 % gel Commonly known as: METROGEL       TAKE these medications    acetaminophen 500 MG tablet Commonly known as: TYLENOL Take 2 tablets (1,000 mg total) by mouth every 6 (six) hours as needed.   gabapentin 100 MG capsule Commonly known as: NEURONTIN Take 1 capsule (100 mg total) by mouth 3 (three) times daily for 14 days.   ibuprofen 200 MG  tablet Commonly known as: ADVIL Take 2 tablets (400 mg total) by mouth every 6 (six) hours as needed for mild pain or moderate pain.   polycarbophil 625 MG tablet Commonly known as: FIBERCON Take 1 tablet (625 mg total) by mouth 2 (two) times daily.   traMADol 50 MG tablet Commonly known as: ULTRAM Take 1 tablet (50 mg total) by mouth every 6 (six) hours as needed  for severe pain (not relieved by tylenol, ibuprofen, gabapentin).          Follow-up Information     Michael Boston, MD. Go on 06/18/2022.   Specialties: General Surgery, Colon and Rectal Surgery Why: at 11:30 AM for post-operative follow up. please arrive 30 minutes early. Contact information: [address] [place] [address] [phone]                 Signed: Obie Dredge, [facility] 06/11/2022, 2:11 PM

## 2022-06-14 ENCOUNTER — Other Ambulatory Visit: Payer: Self-pay

## 2022-06-14 ENCOUNTER — Inpatient Hospital Stay: Payer: Medicaid Other

## 2022-06-14 ENCOUNTER — Other Ambulatory Visit: Payer: Self-pay | Admitting: Hematology

## 2022-06-14 VITALS — BP 115/79 | HR 88 | Temp 98.5°F | Resp 16

## 2022-06-14 DIAGNOSIS — C7A00 Malignant carcinoid tumor of unspecified site: Secondary | ICD-10-CM

## 2022-06-14 DIAGNOSIS — C7A019 Malignant carcinoid tumor of the small intestine, unspecified portion: Secondary | ICD-10-CM | POA: Diagnosis not present

## 2022-06-14 LAB — 5 HIAA, QUANTITATIVE, URINE, 24 HOUR
5-HIAA, Ur: 77.1 mg/L
5-HIAA,Quant.,24 Hr Urine: 81 mg/24 hr — ABNORMAL HIGH (ref 0.0–14.9)
Total Volume: 1150

## 2022-06-14 MED ORDER — OCTREOTIDE ACETATE 20 MG IM KIT
20.0000 mg | PACK | Freq: Once | INTRAMUSCULAR | Status: AC
  Administered 2022-06-14: 20 mg via INTRAMUSCULAR
  Filled 2022-06-14: qty 1

## 2022-07-11 ENCOUNTER — Other Ambulatory Visit: Payer: Self-pay

## 2022-07-11 ENCOUNTER — Inpatient Hospital Stay: Payer: Medicaid Other

## 2022-07-11 ENCOUNTER — Encounter: Payer: Self-pay | Admitting: Hematology

## 2022-07-11 ENCOUNTER — Inpatient Hospital Stay (HOSPITAL_BASED_OUTPATIENT_CLINIC_OR_DEPARTMENT_OTHER): Payer: Medicaid Other | Admitting: Hematology

## 2022-07-11 ENCOUNTER — Inpatient Hospital Stay: Payer: Medicaid Other | Attending: Physician Assistant

## 2022-07-11 VITALS — BP 117/73 | HR 69 | Temp 98.5°F | Resp 18 | Ht 67.0 in | Wt 162.2 lb

## 2022-07-11 DIAGNOSIS — C7A019 Malignant carcinoid tumor of the small intestine, unspecified portion: Secondary | ICD-10-CM | POA: Diagnosis not present

## 2022-07-11 DIAGNOSIS — R232 Flushing: Secondary | ICD-10-CM | POA: Insufficient documentation

## 2022-07-11 DIAGNOSIS — Z9049 Acquired absence of other specified parts of digestive tract: Secondary | ICD-10-CM | POA: Diagnosis not present

## 2022-07-11 DIAGNOSIS — C787 Secondary malignant neoplasm of liver and intrahepatic bile duct: Secondary | ICD-10-CM | POA: Diagnosis not present

## 2022-07-11 DIAGNOSIS — R002 Palpitations: Secondary | ICD-10-CM | POA: Insufficient documentation

## 2022-07-11 DIAGNOSIS — R109 Unspecified abdominal pain: Secondary | ICD-10-CM | POA: Diagnosis not present

## 2022-07-11 DIAGNOSIS — C7A012 Malignant carcinoid tumor of the ileum: Secondary | ICD-10-CM

## 2022-07-11 DIAGNOSIS — Z79899 Other long term (current) drug therapy: Secondary | ICD-10-CM | POA: Diagnosis not present

## 2022-07-11 DIAGNOSIS — D649 Anemia, unspecified: Secondary | ICD-10-CM | POA: Insufficient documentation

## 2022-07-11 DIAGNOSIS — R110 Nausea: Secondary | ICD-10-CM | POA: Insufficient documentation

## 2022-07-11 DIAGNOSIS — R160 Hepatomegaly, not elsewhere classified: Secondary | ICD-10-CM

## 2022-07-11 DIAGNOSIS — R197 Diarrhea, unspecified: Secondary | ICD-10-CM | POA: Insufficient documentation

## 2022-07-11 LAB — CBC WITH DIFFERENTIAL/PLATELET
Abs Immature Granulocytes: 0.01 10*3/uL (ref 0.00–0.07)
Basophils Absolute: 0 10*3/uL (ref 0.0–0.1)
Basophils Relative: 1 %
Eosinophils Absolute: 0.1 10*3/uL (ref 0.0–0.5)
Eosinophils Relative: 2 %
HCT: 33.9 % — ABNORMAL LOW (ref 36.0–46.0)
Hemoglobin: 11.2 g/dL — ABNORMAL LOW (ref 12.0–15.0)
Immature Granulocytes: 0 %
Lymphocytes Relative: 45 %
Lymphs Abs: 2.7 10*3/uL (ref 0.7–4.0)
MCH: 26.9 pg (ref 26.0–34.0)
MCHC: 33 g/dL (ref 30.0–36.0)
MCV: 81.5 fL (ref 80.0–100.0)
Monocytes Absolute: 0.4 10*3/uL (ref 0.1–1.0)
Monocytes Relative: 7 %
Neutro Abs: 2.7 10*3/uL (ref 1.7–7.7)
Neutrophils Relative %: 45 %
Platelets: 329 10*3/uL (ref 150–400)
RBC: 4.16 MIL/uL (ref 3.87–5.11)
RDW: 14.6 % (ref 11.5–15.5)
WBC: 5.9 10*3/uL (ref 4.0–10.5)
nRBC: 0 % (ref 0.0–0.2)

## 2022-07-11 LAB — COMPREHENSIVE METABOLIC PANEL
ALT: 9 U/L (ref 0–44)
AST: 12 U/L — ABNORMAL LOW (ref 15–41)
Albumin: 4.1 g/dL (ref 3.5–5.0)
Alkaline Phosphatase: 76 U/L (ref 38–126)
Anion gap: 3 — ABNORMAL LOW (ref 5–15)
BUN: 10 mg/dL (ref 6–20)
CO2: 26 mmol/L (ref 22–32)
Calcium: 8.9 mg/dL (ref 8.9–10.3)
Chloride: 108 mmol/L (ref 98–111)
Creatinine, Ser: 0.47 mg/dL (ref 0.44–1.00)
GFR, Estimated: >60 mL/min (ref >60)
Glucose, Bld: 97 mg/dL (ref 70–99)
Potassium: 3.5 mmol/L (ref 3.5–5.1)
Sodium: 137 mmol/L (ref 135–145)
Total Bilirubin: 0.5 mg/dL (ref 0.3–1.2)
Total Protein: 7 g/dL (ref 6.5–8.1)

## 2022-07-11 MED ORDER — ONDANSETRON HCL 8 MG PO TABS: 8.0000 mg | ORAL_TABLET | Freq: Three times a day (TID) | ORAL | 1 refills | Status: DC | PRN

## 2022-07-11 MED ORDER — OCTREOTIDE ACETATE 20 MG IM KIT
20.0000 mg | PACK | Freq: Once | INTRAMUSCULAR | Status: AC
  Administered 2022-07-11: 20 mg via INTRAMUSCULAR
  Filled 2022-07-11: qty 1

## 2022-07-11 NOTE — Progress Notes (Addendum)
[place]   Telephone:([phone] Fax:(336) [phone]   Clinic Follow up Note   Patient Care Team: Billie Ruddy, MD as PCP - General (Family Medicine) Michael Boston, MD as Consulting Physician (General Surgery) Truitt Merle, MD as Consulting Physician (Oncology)  Date of Service:  07/11/2022  CHIEF COMPLAINT: f/u of metastatic carcinoid tumor  CURRENT THERAPY:  Sandostatin injections, q28d, started on 06/14/2022  ASSESSMENT & PLAN:  Linda Villanueva is a 51 y.o. female with   1. 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   -presented with upper abdominal pain with nausea and vomiting. CT 05/16/22 revealed: 2.9 cm mesenteric mass; multiple liver lesions; focal segment of hyperenhancing small bowel; findings suspicious for partial small bowel obstruction.  -S/p emergent resection on 05/18/22 by Dr. Johney Maine. Small bowel mass pathology showed 1 cm well-differentiated NET, grade 1, infiltrating serosa, and an associated 3.2 cm mesenteric mass. Margins negative, 1/15 positive lymph nodes. Liver pathology confirmed metastatic NET. -Baseline chromogranin A on 05/17/22 elevated at 327.9. -24hr urine obtained 06/11/22 showed elevated 5-HIAA. -she is scheduled to start sandostatin injections today, 07/11/22. Labs reviewed, overall improved since hospital discharge. Her chromogranin A is still pending, hopefully it dropped with surgery. Will proceed with sandostatin today and continue every 4 weeks    2. Abdominal pain, nausea, diarrhea, flushing -She presented to ER with abdominal pain, nausea, diarrhea, and flushing. -she has occasional abdominal pain now, more manageable. -she notes continued diarrhea, 3 BM/day. -She has improved but persistent flushing, likely secondary to carcinoid tumor.    3. Social -Lives with her husband, Linda Villanueva, and 10 family members in a house.  The patient states it is a large house and  there is space for everyone. 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 Her husband works as a [employer] officer. -She met with social work  -The patient hopes to visit her mother in [place]. We discussed again today-- I recommend waiting until after receiving 2-3 injections that we would be happy to work with her travel schedule in the future.   4. Anemia -likely secondary to surgery.   -hgb improved to 11.2 today (07/11/22), will monitor on treatment     PLAN: -proceed with sandostatin injection at 82m today, and continue every 4 weeks, will increase to 385mfrom next injection  -lab and f/u on 10/19 before injection -echo in 2-3 weeks   No problem-specific Assessment & Plan notes found for this encounter.   SUMMARY OF ONCOLOGIC HISTORY: Oncology History  Malignant carcinoid tumor of small intestine ([facility] 05/16/2022 Imaging   CT ABDOMEN PELVIS W CONTRAST    IMPRESSION: 1. Multiple mildly dilated fluid-filled loops of mid small bowel, suspicious for partial small bowel obstruction, poorly defined transition point. Some mesenteric vascular congestion but no intramural air or bowel wall thickening. 2. 2.9 cm enhancing central mesenteric mass. 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 Focal segment of hyperenhancing small bowel could be potential source/site of primary. 3. Small moderate volume free fluid in the pelvis 4. Mildly prominent common bile duct which Linda Villanueva be correlated with LFTs.    05/17/2022 Imaging   CT CHEST WO CONTRAST  IMPRESSION: Indeterminate solid 3 mm right upper lobe pulmonary nodule.   Multiple liver masses best seen on recent contrast enhanced CT of the abdomen and pelvis.   05/17/2022 Tumor  Marker   Patient's tumor was tested for the following markers: Chromogranin A. Results of the tumor marker test revealed elevation at  327.9.   05/18/2022 Procedure   POST-OPERATIVE DIAGNOSIS:   JEJUNAL MASS - PARTIALLY OBSTRUCTING LIVER MASSES PROBABLE METASTATIC CARCINOID    PROCEDURE:   EXPLORATORY LAPAROTOMY SMALL BOWEL RESECTION WEDGE LIVER BIOPSY   SURGEON:  Adin Hector, MD   OR FINDINGS: 15 mm small bowel nodule at junction of jejunum and ileum causing partial obstruction.  Moderately bulky lymphadenopathy in the mesentery proximal to the nodule.  En bloc resection done.  230 cm of small intestine remaining from ligament of Treitz to ileocecal valve.  Numerous miliary 64m-60 mm liver nodules.  Wedge resection done of right hepatic lobe just lateral to the gallbladder hepatic fossa.   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       05/18/2022 Pathology Results   SURGICAL PATHOLOGY  CASE: [id number] PATIENT: Linda Villanueva  Surgical Pathology Report   FINAL MICROSCOPIC DIAGNOSIS:   A. SMALL BOWEL, MASS, RESECTION:  Well-differentiated neuroendocrine tumor, G1  Tumor measures 1.0 x 0.9 x 0.5 cm  Tumor infiltrates into the serosa (pT3)  Margins free  One of 15 lymph nodes with metastatic tumor and an associated mesenteric  mass measuring 3.2 cm (pN2) (see comment within template)  Perineural invasion present   B. 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 She was last seen by me with PA  Cassie on 06/07/22. She presents to the clinic alone. STRATUS interpreter was used. She reports her symptoms are the same. She reports occasional abdominal pain and continued diarrhea with at least 3 BM a day. She reports flushing most of the time; her face is red today in the office. She also notes occasional palpitations.   All other systems were reviewed with the patient and are negative.  MEDICAL HISTORY:  Past Medical History:  Diagnosis Date   Vitamin B12 deficiency    Vitamin D deficiency     SURGICAL HISTORY: Past Surgical History:  Procedure Laterality Date   INNER EAR SURGERY     LAPAROTOMY N/A 05/18/2022   Procedure: EXPLORATORY LAPAROTOMY; SMALL BOWEL RESECTION; WEDGE LIVER BIOPSY;  Surgeon: GMichael Boston MD;  Location: [facility];  Service: General;  Laterality: N/A;    I have reviewed the social history and family history with the patient and they are unchanged from previous note.  ALLERGIES:  has No Known Allergies.  MEDICATIONS:  Current Outpatient Medications  Medication Sig Dispense Refill   acetaminophen (TYLENOL) 500 MG tablet Take 2 tablets (1,000 mg total) by mouth every 6 (six) hours as needed. 30 tablet 0   gabapentin (NEURONTIN) 100 MG capsule Take 1 capsule (100 mg total) by mouth 3 (three) times daily for 14 days. 42 capsule 0   ibuprofen (ADVIL) 200 MG tablet Take 2 tablets (400 mg total) by  mouth every 6 (six) hours as needed for mild pain or moderate pain.     ondansetron (ZOFRAN) 8 MG tablet Take 1 tablet (8 mg total) by mouth every 8 (eight) hours as needed for nausea or vomiting. 30 tablet 1   polycarbophil (FIBERCON) 625 MG tablet Take 1 tablet (625 mg total) by mouth 2 (two) times daily. 30 tablet 0   traMADol (ULTRAM) 50 MG tablet Take 1 tablet (50 mg total) by mouth every 6 (six) hours as needed for severe pain (not relieved by tylenol, ibuprofen, gabapentin). 20 tablet 0   No current facility-administered medications for this visit.    PHYSICAL  EXAMINATION: ECOG PERFORMANCE STATUS: 1 - Symptomatic but completely ambulatory  Vitals:   07/11/22 1340  BP: 117/73  Pulse: 69  Resp: 18  Temp: 98.5 F (36.9 C)  SpO2: 100%   Wt Readings from Last 3 Encounters:  07/11/22 162 lb 3.2 oz (73.6 kg)  06/07/22 162 lb 1.6 oz (73.5 kg)  05/22/22 166 lb 0.1 oz (75.3 kg)     GENERAL:alert, no distress and comfortable SKIN: no rashes or significant lesions, (+) diffuse flushing to face EYES: normal, Conjunctiva are pink and non-injected, sclera clear  NEURO: alert & oriented x 3 with fluent speech  LABORATORY DATA:  I have reviewed the data as listed    Latest Ref Rng & Units 07/11/2022    1:21 PM 05/22/2022   12:33 AM 05/20/2022    7:28 AM  CBC  WBC 4.0 - 10.5 K/uL 5.9  9.5  11.0   Hemoglobin 12.0 - 15.0 g/dL 11.2  9.5  10.0   Hematocrit 36.0 - 46.0 % 33.9  29.1  30.7   Platelets 150 - 400 K/uL 329  311  267         Latest Ref Rng & Units 07/11/2022    1:21 PM 05/22/2022   12:33 AM 05/20/2022    4:22 AM  CMP  Glucose 70 - 99 mg/dL 97   105   BUN 6 - 20 mg/dL 10   <5   Creatinine 0.44 - 1.00 mg/dL 0.47  0.45  0.46   Sodium 135 - 145 mmol/L 137   140   Potassium 3.5 - 5.1 mmol/L 3.5  3.7  4.8   Chloride 98 - 111 mmol/L 108   110   CO2 22 - 32 mmol/L 26   25   Calcium 8.9 - 10.3 mg/dL 8.9   7.9   Total Protein 6.5 - 8.1 g/dL 7.0     Total Bilirubin 0.3 - 1.2 mg/dL 0.5     Alkaline Phos 38 - 126 U/L 76     AST 15 - 41 U/L 12     ALT 0 - 44 U/L 9         RADIOGRAPHIC STUDIES: I have personally reviewed the radiological images as listed and agreed with the findings in the report. No results found.    Orders Placed This Encounter  Procedures   Ferritin    Standing Status:   Future    Standing Expiration Date:   07/12/2023   Vitamin B12    Standing Status:   Standing    Number of Occurrences:   3    Standing Expiration Date:   07/12/2023   ECHOCARDIOGRAM COMPLETE    Metastatic neuroendocrine tumor, palpitation, evaluate  heart structure and function    Standing Status:   Future    Standing Expiration Date:   07/12/2023  Order Specific Question:   Where should this test be performed    Answer:   [facility]    Order Specific Question:   Perflutren DEFINITY (image enhancing agent) should be administered unless hypersensitivity or allergy exist    Answer:   Administer Perflutren    Order Specific Question:   Is a special reader required? (athlete or structural heart)    Answer:   No    Order Specific Question:   Does this study need to be read by the Structural team/Level 3 readers?    Answer:   No    Order Specific Question:   Reason for exam-Echo    Answer:   Dyspnea  R06.00   All questions were answered. The patient knows to call the clinic with any problems, questions or concerns. No barriers to learning was detected. The total time spent in the appointment was 30 minutes.     Truitt Merle, MD 07/11/2022   I, Wilburn Mylar, am acting as scribe for Truitt Merle, MD.   I have reviewed the above documentation for accuracy and completeness, and I agree with the above.

## 2022-07-11 NOTE — Patient Instructions (Signed)
Octreotide Injection Solution What is this medication? OCTREOTIDE (ok TREE oh tide) treats high levels of growth hormone (acromegaly). It works by reducing the amount of growth hormone your body makes. This reduces symptoms and the risk of health problems caused by too much growth hormone, such as diabetes and heart disease. It Corda also be used to treat diarrhea caused by neuroendocrine tumors. It works by slowing down the release of serotonin from the tumor cells. This reduces the number of bowel movements you have. This medicine Fallyn be used for other purposes; ask your health care provider or pharmacist if you have questions. COMMON BRAND NAME(S): Bynfezia, Sandostatin What should I tell my care team before I take this medication? They need to know if you have any of these conditions: Diabetes Gallbladder disease Kidney disease Liver disease Thyroid disease An unusual or allergic reaction to octreotide, other medications, foods, dyes, or preservatives Pregnant or trying to get pregnant Breast-feeding How should I use this medication? This medication is injected under the skin or into a vein. It is usually given by your care team in a hospital or clinic setting. If you get this medication at home, you will be taught how to prepare and give it. Use exactly as directed. Take it as directed on the prescription label at the same time every day. Keep taking it unless your care team tells you to stop. Allow the injection solution to come to room temperature before use. Do not warm it artificially. It is important that you put your used needles and syringes in a special sharps container. Do not put them in a trash can. If you do not have a sharps container, call your pharmacist or care team to get one. Talk to your care team about the use of this medication in children. Special care Chaz be needed. Overdosage: If you think you have taken too much of this medicine contact a poison control center or  emergency room at once. NOTE: This medicine is only for you. Do not share this medicine with others. What if I miss a dose? If you miss a dose, take it as soon as you can. If it is almost time for your next dose, take only that dose. Do not take double or extra doses. What Kytzia interact with this medication? Bromocriptine Certain medications for blood pressure, heart disease, irregular heartbeat Cyclosporine Diuretics Medications for diabetes, including insulin Quinidine This list Erla not describe all possible interactions. Give your health care provider a list of all the medicines, herbs, non-prescription drugs, or dietary supplements you use. Also tell them if you smoke, drink alcohol, or use illegal drugs. Some items Trianna interact with your medicine. What should I watch for while using this medication? Visit your care team for regular checks on your progress. Tell your care team if your symptoms do not start to get better or if they get worse. To help reduce irritation at the injection site, use a different site for each injection and make sure the solution is at room temperature before use. This medication Lillie cause decreases in blood sugar. Signs of low blood sugar include chills, cool, pale skin or cold sweats, drowsiness, extreme hunger, fast heartbeat, headache, nausea, nervousness or anxiety, shakiness, trembling, unsteadiness, tiredness, or weakness. Contact your care team right away if you experience any of these symptoms. This medication Lindsay increase blood sugar. The risk Oneika be higher in patients who already have diabetes. Ask your care team what you can do to lower your   risk of diabetes while taking this medication. You should make sure you get enough vitamin B12 while you are taking this medication. Discuss the foods you eat and the vitamins you take with your care team. What side effects Tonjua I notice from receiving this medication? Side effects that you should report to your care  team as soon as possible: Allergic reactions--skin rash, itching, hives, swelling of the face, lips, tongue, or throat Gallbladder problems--severe stomach pain, nausea, vomiting, fever Heart rhythm changes--fast or irregular heartbeat, dizziness, feeling faint or lightheaded, chest pain, trouble breathing High blood sugar (hyperglycemia)--increased thirst or amount of urine, unusual weakness or fatigue, blurry vision Low blood sugar (hypoglycemia)--tremors or shaking, anxiety, sweating, cold or clammy skin, confusion, dizziness, rapid heartbeat Low thyroid levels (hypothyroidism)--unusual weakness or fatigue, increased sensitivity to cold, constipation, hair loss, dry skin, weight gain, feelings of depression Low vitamin B12 level--pain, tingling, or numbness in the hands or feet, muscle weakness, dizziness, confusion, trouble concentrating Pancreatitis--severe stomach pain that spreads to your back or gets worse after eating or when touched, fever, nausea, vomiting Side effects that usually do not require medical attention (report to your care team if they continue or are bothersome): Diarrhea Dizziness Gas Headache Pain, redness, or irritation at injection site Stomach pain This list Coty not describe all possible side effects. Call your doctor for medical advice about side effects. You Jett report side effects to FDA at 1-800-FDA-1088. Where should I keep my medication? Keep out of the reach of children and pets. Store in the refrigerator. Protect from light. Allow to come to room temperature naturally. Do not use artificial heat. If protected from light, the injection Ruhee be stored between 20 and 30 degrees C (70 and 86 degrees F) for 14 days. After the initial use, throw away any unused portion of a multiple dose vial after 14 days. Get rid of any unused portions of the ampules after use. To get rid of medications that are no longer needed or have expired: Take the medication to a medication  take-back program. Ask your pharmacy or law enforcement to find a location. If you cannot return the medication, ask your pharmacist or care team how to get rid of the medication safely. NOTE: This sheet is a summary. It Boston not cover all possible information. If you have questions about this medicine, talk to your doctor, pharmacist, or health care provider.  2023 Elsevier/Gold Standard (2007-12-26 00:00:00)  

## 2022-07-12 ENCOUNTER — Other Ambulatory Visit: Payer: Self-pay

## 2022-07-12 LAB — CHROMOGRANIN A: Chromogranin A (ng/mL): 286.2 ng/mL — ABNORMAL HIGH (ref 0.0–101.8)

## 2022-07-16 ENCOUNTER — Telehealth: Payer: Self-pay | Admitting: Hematology

## 2022-07-16 NOTE — Telephone Encounter (Signed)
Scheduled follow-up appointment per 8/24 los. Patient is aware. 

## 2022-07-17 ENCOUNTER — Inpatient Hospital Stay (HOSPITAL_COMMUNITY): Admission: RE | Admit: 2022-07-17 | Payer: Medicaid Other | Source: Ambulatory Visit

## 2022-07-17 ENCOUNTER — Ambulatory Visit (HOSPITAL_COMMUNITY)
Admission: RE | Admit: 2022-07-17 | Discharge: 2022-07-17 | Disposition: A | Discharge status: Home / Self Care | Payer: Medicaid Other | Source: Ambulatory Visit | Attending: Hematology | Admitting: Hematology

## 2022-07-17 DIAGNOSIS — C7A012 Malignant carcinoid tumor of the ileum: Secondary | ICD-10-CM | POA: Insufficient documentation

## 2022-07-17 DIAGNOSIS — R0600 Dyspnea, unspecified: Secondary | ICD-10-CM | POA: Diagnosis present

## 2022-07-17 DIAGNOSIS — Z0189 Encounter for other specified special examinations: Secondary | ICD-10-CM | POA: Diagnosis not present

## 2022-07-17 LAB — ECHOCARDIOGRAM COMPLETE
Area-P 1/2: 4.12 cm2
S' Lateral: 3.2 cm

## 2022-07-17 NOTE — Progress Notes (Signed)
  Echocardiogram 2D Echocardiogram has been performed.  Darlina Sicilian M 07/17/2022, 11:22 AM

## 2022-07-19 DIAGNOSIS — C801 Malignant (primary) neoplasm, unspecified: Secondary | ICD-10-CM

## 2022-07-19 HISTORY — DX: Malignant (primary) neoplasm, unspecified: C80.1

## 2022-08-08 ENCOUNTER — Inpatient Hospital Stay: Payer: Medicaid Other | Attending: Physician Assistant

## 2022-08-08 ENCOUNTER — Other Ambulatory Visit: Payer: Self-pay

## 2022-08-08 VITALS — BP 126/76 | HR 69 | Temp 98.3°F | Resp 18

## 2022-08-08 DIAGNOSIS — C7A012 Malignant carcinoid tumor of the ileum: Secondary | ICD-10-CM | POA: Insufficient documentation

## 2022-08-08 DIAGNOSIS — C7B02 Secondary carcinoid tumors of liver: Secondary | ICD-10-CM | POA: Diagnosis not present

## 2022-08-08 DIAGNOSIS — D649 Anemia, unspecified: Secondary | ICD-10-CM | POA: Diagnosis not present

## 2022-08-08 DIAGNOSIS — R232 Flushing: Secondary | ICD-10-CM | POA: Insufficient documentation

## 2022-08-08 DIAGNOSIS — R197 Diarrhea, unspecified: Secondary | ICD-10-CM | POA: Insufficient documentation

## 2022-08-08 DIAGNOSIS — R109 Unspecified abdominal pain: Secondary | ICD-10-CM | POA: Diagnosis not present

## 2022-08-08 DIAGNOSIS — Z79899 Other long term (current) drug therapy: Secondary | ICD-10-CM | POA: Insufficient documentation

## 2022-08-08 DIAGNOSIS — R110 Nausea: Secondary | ICD-10-CM | POA: Insufficient documentation

## 2022-08-08 MED ORDER — OCTREOTIDE ACETATE 30 MG IM KIT
30.0000 mg | PACK | Freq: Once | INTRAMUSCULAR | Status: AC
  Administered 2022-08-08: 30 mg via INTRAMUSCULAR
  Filled 2022-08-08: qty 1

## 2022-08-21 ENCOUNTER — Ambulatory Visit: Payer: Medicaid Other | Admitting: Family Medicine

## 2022-09-04 ENCOUNTER — Other Ambulatory Visit: Payer: Self-pay

## 2022-09-04 NOTE — Progress Notes (Unsigned)
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 Multiple mildly dilated fluid-filled loops of mid small bowel, suspicious for partial small bowel obstruction, poorly defined transition point. Some mesenteric vascular congestion but no intramural air or bowel wall thickening. 2. 2.9 cm enhancing central mesenteric mass. 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 Focal segment of hyperenhancing small bowel could be potential source/site of primary. 3. Small moderate volume free fluid in the pelvis 4. Mildly prominent common bile duct which Jodye be correlated with LFTs.    05/17/2022 Imaging   CT CHEST WO CONTRAST  IMPRESSION: Indeterminate solid 3 mm right upper lobe pulmonary nodule.   Multiple liver masses best seen on recent contrast enhanced CT of the abdomen and pelvis.   05/17/2022 Tumor Marker   Patient's tumor was tested for the following markers: Chromogranin A. Results of the tumor marker test revealed elevation at 327.9.   05/18/2022 Procedure   POST-OPERATIVE DIAGNOSIS:   JEJUNAL MASS - PARTIALLY OBSTRUCTING LIVER MASSES PROBABLE METASTATIC CARCINOID    PROCEDURE:   EXPLORATORY LAPAROTOMY SMALL BOWEL RESECTION WEDGE LIVER BIOPSY   SURGEON:  Adin Hector, MD   OR FINDINGS: 15 mm small bowel nodule at junction of jejunum and ileum causing partial obstruction.  Moderately bulky lymphadenopathy in the mesentery  proximal to the nodule.  En bloc resection done.  230 cm of small intestine remaining from ligament of Treitz to ileocecal valve.  Numerous miliary 88m-60 mm liver nodules.  Wedge resection done of right hepatic lobe just lateral to the gallbladder hepatic fossa.   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       05/18/2022 Pathology Results   SURGICAL PATHOLOGY  CASE: [id number] PATIENT: Sanvi Pollina  Surgical Pathology Report   FINAL MICROSCOPIC DIAGNOSIS:   A. SMALL BOWEL, MASS, RESECTION:  Well-differentiated neuroendocrine tumor, G1  Tumor measures 1.0 x 0.9 x 0.5 cm  Tumor infiltrates into the serosa (pT3)  Margins free  One of 15 lymph nodes with metastatic tumor and an associated mesenteric  mass measuring 3.2 cm (pN2) (see comment within template)  Perineural invasion present   B. LIVER, WEDGE RESECTION:  Metastatic well differentiated neuroendocrine tumor, 5 foci (ranging  from 7 mm to 0.35 mm)   Pathologic Stage Classification (pTNM, AJCC 8th Edition): pT3, pN2    05/18/2022 Cancer Staging   Staging form: Small Intestine - Other Histologies, AJCC 8th Edition - Pathologic stage from 05/18/2022: pT3, pN2, cM1 - Signed by FTruitt Merle MD on 07/11/2022 Histologic grade (G): G1 Histologic grading system: 4 grade system   06/07/2022 Initial Diagnosis   Malignant carcinoid tumor of unknown primary site ([facility]     CURRENT THERAPY: Sandostatin 20 mg injections q28 days, started 06/14/22, increased to 30 mg in 07/2022  INTERVAL HISTORY: Ms. YPrincipatoreturns with her spouse for follow up as scheduled. Last seen by Dr. FBurr Medico8/24/23. She continues monthly sandostatin, dose increased in September.  On the side of the injection she has pain in  her neck and that upper extremity for the 3-5 days.  She has abdominal cramping for 3 days and diarrhea starting on day 2 that lasts 4-5 days then gets much better and BMs become almost normal.  Flushing has improved on  treatment.  Overall her carcinoid symptoms have improved on treatment.  She eats very healthy and continues to walk 1-2 miles daily.  She saw her dentist yesterday for an infection and needs the tooth extracted.  All other systems were reviewed with the patient and are negative.  MEDICAL HISTORY:  Past Medical History:  Diagnosis Date   Vitamin B12 deficiency    Vitamin D deficiency     SURGICAL HISTORY: Past Surgical History:  Procedure Laterality Date   INNER EAR SURGERY     LAPAROTOMY N/A 05/18/2022   Procedure: EXPLORATORY LAPAROTOMY; SMALL BOWEL RESECTION; WEDGE LIVER BIOPSY;  Surgeon: Michael Boston, MD;  Location: [facility];  Service: General;  Laterality: N/A;    I have reviewed the social history and family history with the patient and they are unchanged from previous note.  ALLERGIES:  has No Known Allergies.  MEDICATIONS:  Current Outpatient Medications  Medication Sig Dispense Refill   acetaminophen (TYLENOL) 500 MG tablet Take 2 tablets (1,000 mg total) by mouth every 6 (six) hours as needed. 30 tablet 0   gabapentin (NEURONTIN) 100 MG capsule Take 1 capsule (100 mg total) by mouth 3 (three) times daily for 14 days. 42 capsule 0   ibuprofen (ADVIL) 200 MG tablet Take 2 tablets (400 mg total) by mouth every 6 (six) hours as needed for mild pain or moderate pain.     ondansetron (ZOFRAN) 8 MG tablet Take 1 tablet (8 mg total) by mouth every 8 (eight) hours as needed for nausea or vomiting. 30 tablet 1   polycarbophil (FIBERCON) 625 MG tablet Take 1 tablet (625 mg total) by mouth 2 (two) times daily. 30 tablet 0   traMADol (ULTRAM) 50 MG tablet Take 1 tablet (50 mg total) by mouth every 6 (six) hours as needed for severe pain (not relieved by tylenol, ibuprofen, gabapentin). 20 tablet 0   No current facility-administered medications for this visit.    PHYSICAL EXAMINATION: ECOG PERFORMANCE STATUS: 1 - Symptomatic but completely ambulatory  Vitals:   09/05/22 1210  BP:  124/69  Pulse: 66  Resp: 18  Temp: 98.2 F (36.8 C)  SpO2: 100%   Filed Weights   09/05/22 1210  Weight: 159 lb 9.6 oz (72.4 kg)    GENERAL:alert, no distress and comfortable SKIN: No rash EYES: sclera clear OROPHARYNX: Gingival erythema at the left lateral maxilla LUNGS: clear with normal breathing effort HEART: regular rate & rhythm, no lower extremity edema ABDOMEN:abdomen soft, non-tender and normal bowel sounds NEURO: alert & oriented x 3 with fluent speech, no focal motor/sensory deficits  LABORATORY DATA:  I have reviewed the data as listed    Latest Ref Rng & Units 07/11/2022    1:21 PM 05/22/2022   12:33 AM 05/20/2022    7:28 AM  CBC  WBC 4.0 - 10.5 K/uL 5.9  9.5  11.0   Hemoglobin 12.0 - 15.0 g/dL 11.2  9.5  10.0   Hematocrit 36.0 - 46.0 % 33.9  29.1  30.7   Platelets 150 - 400 K/uL 329  311  267         Latest Ref Rng & Units 07/11/2022    1:21 PM 05/22/2022   12:33 AM 05/20/2022    4:22 AM  CMP  Glucose 70 - 99 mg/dL 97   105   BUN 6 - 20 mg/dL 10   <5   Creatinine 0.44 - 1.00 mg/dL 0.47  0.45  0.46   Sodium 135 - 145 mmol/L 137   140   Potassium 3.5 - 5.1 mmol/L 3.5  3.7  4.8   Chloride 98 - 111 mmol/L 108   110   CO2 22 - 32 mmol/L 26   25   Calcium 8.9 - 10.3 mg/dL 8.9   7.9   Total Protein 6.5 - 8.1 g/dL 7.0     Total Bilirubin 0.3 - 1.2 mg/dL 0.5     Alkaline Phos 38 - 126 U/L 76     AST 15 - 41 U/L 12     ALT 0 - 44 U/L 9         RADIOGRAPHIC STUDIES: I have personally reviewed the radiological images as listed and agreed with the findings in the report. No results found.   ASSESSMENT & PLAN: Cicilia Gladis Soley is a 51 y.o. female with    1. 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   -presented with upper abdominal pain with nausea and vomiting. CT 05/16/22 revealed: 2.9 cm mesenteric mass; multiple liver lesions; focal segment of hyperenhancing small bowel; findings suspicious  for partial small bowel obstruction.  -S/p emergent resection on 05/18/22 by Dr. Johney Maine. Small bowel mass pathology showed 1 cm well-differentiated NET, grade 1, infiltrating serosa, and an associated 3.2 cm mesenteric mass. Margins negative, 1/15 positive lymph nodes. Liver pathology confirmed metastatic NET. -Baseline chromogranin A on 05/17/22 elevated at 327.9.  This decreased to 286 after starting treatment -24hr urine obtained 06/11/22 showed elevated 5-HIAA. -she began first-line sandostatin injections 07/11/22, increased to full dose in September. -Ms. Heinecke appears stable, tolerating monthly Sandostatin with injection site and body pain. Cramping and diarrhea peak from days 2-7 then bowel movements become almost normal.  Overall the NET symptoms have improved since starting treatment -She will proceed with Sandostatin today as planned, and continue q. 28 days.  We will check labs today  -Follow-up in 3 months -I completed clearance letter for dental work and returned the form to patient  2. Abdominal pain, nausea, diarrhea, flushing -She presented to ER with abdominal pain, nausea, diarrhea, and flushing. -she has occasional abdominal pain, more manageable. -Cramping and diarrhea peak 2-7 days after treatment but overall have improved since starting Sandostatin  -She has improved on treatment  3. Social -Lives with her husband, [name], and 10 family members in a house.  The patient states it is a large house and there is space for everyone. 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 Her husband works as a [employer] officer. -She met with social work  -The patient hopes to visit her mother in [place]. We discussed again today-- I recommend waiting until after receiving 2-3 injections that we would be happy to work with her travel schedule in the future.   4. Anemia -likely secondary to surgery.  Improving -CBC and B12 are pending from  today   Plan: -Proceed with Sandostatin today as planned, continue q. 28 days -Labs today, will call with results -Clearance letter for dental work given completed and given to patient, to be done 2 weeks after today's treatment -Follow-up in 3 months, or sooner if needed   All questions were answered. The  patient knows to call the clinic with any problems, questions or concerns. No barriers to learning was detected. I spent 20 minutes counseling the patient face to face. The total time spent in the appointment was 30 minutes and more than 50% was on counseling and review of test results.     Alla Feeling, NP 09/05/22

## 2022-09-05 ENCOUNTER — Inpatient Hospital Stay: Payer: Medicaid Other

## 2022-09-05 ENCOUNTER — Encounter: Payer: Self-pay | Admitting: Nurse Practitioner

## 2022-09-05 ENCOUNTER — Other Ambulatory Visit: Payer: Self-pay

## 2022-09-05 ENCOUNTER — Inpatient Hospital Stay: Payer: Medicaid Other | Attending: Physician Assistant

## 2022-09-05 ENCOUNTER — Inpatient Hospital Stay (HOSPITAL_BASED_OUTPATIENT_CLINIC_OR_DEPARTMENT_OTHER): Payer: Medicaid Other | Admitting: Nurse Practitioner

## 2022-09-05 VITALS — BP 124/69 | HR 66 | Temp 98.2°F | Resp 18 | Ht 67.0 in | Wt 159.6 lb

## 2022-09-05 DIAGNOSIS — M79603 Pain in arm, unspecified: Secondary | ICD-10-CM | POA: Diagnosis not present

## 2022-09-05 DIAGNOSIS — C7B02 Secondary carcinoid tumors of liver: Secondary | ICD-10-CM | POA: Insufficient documentation

## 2022-09-05 DIAGNOSIS — C7A012 Malignant carcinoid tumor of the ileum: Secondary | ICD-10-CM

## 2022-09-05 DIAGNOSIS — R110 Nausea: Secondary | ICD-10-CM | POA: Insufficient documentation

## 2022-09-05 DIAGNOSIS — Z79899 Other long term (current) drug therapy: Secondary | ICD-10-CM | POA: Insufficient documentation

## 2022-09-05 DIAGNOSIS — R109 Unspecified abdominal pain: Secondary | ICD-10-CM | POA: Diagnosis not present

## 2022-09-05 DIAGNOSIS — D649 Anemia, unspecified: Secondary | ICD-10-CM | POA: Insufficient documentation

## 2022-09-05 DIAGNOSIS — Z9049 Acquired absence of other specified parts of digestive tract: Secondary | ICD-10-CM | POA: Insufficient documentation

## 2022-09-05 DIAGNOSIS — M542 Cervicalgia: Secondary | ICD-10-CM | POA: Diagnosis not present

## 2022-09-05 DIAGNOSIS — R232 Flushing: Secondary | ICD-10-CM | POA: Diagnosis not present

## 2022-09-05 DIAGNOSIS — R197 Diarrhea, unspecified: Secondary | ICD-10-CM | POA: Diagnosis not present

## 2022-09-05 DIAGNOSIS — R160 Hepatomegaly, not elsewhere classified: Secondary | ICD-10-CM

## 2022-09-05 LAB — CBC WITH DIFFERENTIAL/PLATELET
Abs Immature Granulocytes: 0.02 10*3/uL (ref 0.00–0.07)
Basophils Absolute: 0.1 10*3/uL (ref 0.0–0.1)
Basophils Relative: 1 %
Eosinophils Absolute: 0.1 10*3/uL (ref 0.0–0.5)
Eosinophils Relative: 2 %
HCT: 34.8 % — ABNORMAL LOW (ref 36.0–46.0)
Hemoglobin: 11.3 g/dL — ABNORMAL LOW (ref 12.0–15.0)
Immature Granulocytes: 0 %
Lymphocytes Relative: 49 %
Lymphs Abs: 3.1 10*3/uL (ref 0.7–4.0)
MCH: 25.9 pg — ABNORMAL LOW (ref 26.0–34.0)
MCHC: 32.5 g/dL (ref 30.0–36.0)
MCV: 79.8 fL — ABNORMAL LOW (ref 80.0–100.0)
Monocytes Absolute: 0.4 10*3/uL (ref 0.1–1.0)
Monocytes Relative: 7 %
Neutro Abs: 2.6 10*3/uL (ref 1.7–7.7)
Neutrophils Relative %: 41 %
Platelets: 349 10*3/uL (ref 150–400)
RBC: 4.36 MIL/uL (ref 3.87–5.11)
RDW: 15.4 % (ref 11.5–15.5)
WBC: 6.3 10*3/uL (ref 4.0–10.5)
nRBC: 0 % (ref 0.0–0.2)

## 2022-09-05 LAB — COMPREHENSIVE METABOLIC PANEL
ALT: 8 U/L (ref 0–44)
AST: 11 U/L — ABNORMAL LOW (ref 15–41)
Albumin: 3.8 g/dL (ref 3.5–5.0)
Alkaline Phosphatase: 73 U/L (ref 38–126)
Anion gap: 6 (ref 5–15)
BUN: 10 mg/dL (ref 6–20)
CO2: 27 mmol/L (ref 22–32)
Calcium: 8.5 mg/dL — ABNORMAL LOW (ref 8.9–10.3)
Chloride: 107 mmol/L (ref 98–111)
Creatinine, Ser: 0.5 mg/dL (ref 0.44–1.00)
GFR, Estimated: >60 mL/min (ref >60)
Glucose, Bld: 101 mg/dL — ABNORMAL HIGH (ref 70–99)
Potassium: 3.6 mmol/L (ref 3.5–5.1)
Sodium: 140 mmol/L (ref 135–145)
Total Bilirubin: 0.4 mg/dL (ref 0.3–1.2)
Total Protein: 6.8 g/dL (ref 6.5–8.1)

## 2022-09-05 LAB — VITAMIN B12: Vitamin B-12: 1082 pg/mL — ABNORMAL HIGH (ref 180–914)

## 2022-09-05 MED ORDER — OCTREOTIDE ACETATE 30 MG IM KIT
30.0000 mg | PACK | Freq: Once | INTRAMUSCULAR | Status: AC
  Administered 2022-09-05: 30 mg via INTRAMUSCULAR
  Filled 2022-09-05: qty 1

## 2022-09-09 LAB — CHROMOGRANIN A: Chromogranin A (ng/mL): 271.4 ng/mL — ABNORMAL HIGH (ref 0.0–101.8)

## 2022-09-11 ENCOUNTER — Inpatient Hospital Stay: Payer: Medicaid Other | Admitting: Licensed Clinical Social Worker

## 2022-09-11 DIAGNOSIS — C7A012 Malignant carcinoid tumor of the ileum: Secondary | ICD-10-CM

## 2022-09-11 NOTE — Progress Notes (Signed)
[facility] CSW Progress Note  Clinical Education officer, museum  informed by Gwinda Maine pt did not submit financial paperwork while undergoing active treatment; however,   pt continues to experience side affects from treatment and her husband has been let go from his job.  Pt has been approved to submit financial paperwork to utilize [employer] if she qualifies.   CSW spoke w/ pt over the phone and invited family to participate in the Giving Tree over the Holiday to which pt verbalized agreement.  CSW to continue to follow as appropriate.     Henriette Combs, LCSW    Patient is participating in a Managed Medicaid Plan:  Yes

## 2022-09-12 ENCOUNTER — Ambulatory Visit (INDEPENDENT_AMBULATORY_CARE_PROVIDER_SITE_OTHER): Payer: Medicaid Other

## 2022-09-12 ENCOUNTER — Ambulatory Visit (INDEPENDENT_AMBULATORY_CARE_PROVIDER_SITE_OTHER): Payer: Medicaid Other | Admitting: Family Medicine

## 2022-09-12 VITALS — BP 122/78 | HR 74 | Temp 98.2°F | Wt 161.0 lb

## 2022-09-12 DIAGNOSIS — R160 Hepatomegaly, not elsewhere classified: Secondary | ICD-10-CM | POA: Diagnosis not present

## 2022-09-12 DIAGNOSIS — M545 Low back pain, unspecified: Secondary | ICD-10-CM

## 2022-09-12 DIAGNOSIS — C7A012 Malignant carcinoid tumor of the ileum: Secondary | ICD-10-CM

## 2022-09-12 MED ORDER — GABAPENTIN 100 MG PO CAPS: 100.0000 mg | ORAL_CAPSULE | Freq: Three times a day (TID) | ORAL | 1 refills | Status: DC

## 2022-09-12 NOTE — Progress Notes (Signed)
Subjective:    Patient ID: Linda Villanueva, female    DOB: 1970/11/26, 51 y.o.   MRN: [id number]  Chief Complaint  Patient presents with   Back Pain    Low back pain since last Thursday, in the middle.took tylenol 1000 mg,but tramadol for the last days. No urinary freq.  Is taking abx from dentist.   Patient accompanied by her husband.  HPI Patient is a 51 year old female with pmh sig for metastatic carcinoid tumor of small intestine, hypothyroidism, HLD, vitamin b12 def, vitamin D def who was seen today for acute concern.  Pt with R sided low back pain x 1 wk.  Started after octreotide injection last wk.  Pt states pain is a 10/10, sharp, electric-like sensation in back.  Pt states she is unable to walk immediately after standing from seated position.  Has to lean forward for a while before being able to stand up straight.  Tramadol does nothing for the pain.  Tylenol 1000 mg helps some.  Past Medical History:  Diagnosis Date   Vitamin B12 deficiency    Vitamin D deficiency     No Known Allergies  ROS General: Denies fever, chills, night sweats, changes in weight, changes in appetite HEENT: Denies headaches, ear pain, changes in vision, rhinorrhea, sore throat CV: Denies CP, palpitations, SOB, orthopnea Pulm: Denies SOB, cough, wheezing GI: Denies abdominal pain, nausea, vomiting, diarrhea, constipation GU: Denies dysuria, hematuria, frequency, vaginal discharge Msk: Denies muscle cramps, joint pains  + right-sided low back pain Neuro: Denies weakness, numbness, tingling Skin: Denies rashes, bruising Psych: Denies depression, anxiety, hallucinations    Objective:    Blood pressure 122/78, pulse 74, temperature 98.2 F (36.8 C), temperature source Oral, weight 161 lb (73 kg), SpO2 98 %.  Gen. Pleasant, well-nourished, in no distress, normal affect   HEENT: [place]/AT, face symmetric, conjunctiva clear, no scleral icterus, PERRLA, EOMI, nares patent without drainage Lungs: no  accessory muscle use, CTAB, no wheezes or rales Cardiovascular: RRR, no m/r/g, no peripheral edema Musculoskeletal: No cervical, thoracic, or midline TTP.  TTP of right-sided lumbar paraspinal muscles.  No sciatic nerve tenderness.  Normal strength in bilateral LEs.  No deformities, no cyanosis or clubbing, normal tone Neuro:  A&Ox3, CN II-XII intact, normal gait Skin:  Warm, no lesions/ rash   Wt Readings from Last 3 Encounters:  09/12/22 161 lb (73 kg)  09/05/22 159 lb 9.6 oz (72.4 kg)  07/11/22 162 lb 3.2 oz (73.6 kg)    Lab Results  Component Value Date   WBC 6.3 09/05/2022   HGB 11.3 (L) 09/05/2022   HCT 34.8 (L) 09/05/2022   PLT 349 09/05/2022   GLUCOSE 101 (H) 09/05/2022   CHOL 215 (H) 07/26/2020   TRIG 88 07/26/2020   HDL 62 07/26/2020   LDLCALC 134 (H) 07/26/2020   ALT 8 09/05/2022   AST 11 (L) 09/05/2022   NA 140 09/05/2022   K 3.6 09/05/2022   CL 107 09/05/2022   CREATININE 0.50 09/05/2022   BUN 10 09/05/2022   CO2 27 09/05/2022   TSH 0.30 (L) 11/28/2021   HGBA1C 5.4 03/16/2020    Assessment/Plan:  Acute right-sided low back pain without sciatica -new problem -Likely nerve pain based on description.  Also consider arthritis, malalignment of spine, spinal stenosis, impingement due to bone spurs, etc. Also consider mets. -will obtain imaging -Discussed supportive care including topical analgesics, heat, massage, stretching. -Discussed trying gabapentin 100 mg.  Reviewed r/b/a. -For continued symptoms or based on imaging  consider PT versus Ortho referral.  - Plan: DG Lumbar Spine Complete, gabapentin (NEURONTIN) 100 MG capsule  Malignant carcinoid tumor of ileum ([place]) -s/p small bowel resection and wedge liver bxp 05/18/22 -Continue octreotide -continue f/u with Oncology  Liver masses - probable metastatic carcinoid  -Continue follow-up with oncology/surgery  F/u prn  Grier Mitts, MD

## 2022-09-19 ENCOUNTER — Telehealth: Payer: Self-pay | Admitting: Family Medicine

## 2022-09-19 NOTE — Telephone Encounter (Signed)
Husband call to state that patient had Octreotide injection on 09/05/22 and has had lower/mid back pain since that time. Patient has taken Tylenol and Ultram that she has been prescribed in the past with no relief.  Patient presented to PCP on 10/26 and was prescribed Neurontin. DG Lumbar Spine completed on 10/26 23 with negative impression. Patient rates pain at 8/10.   Routed to Dr. Burr Medico

## 2022-09-19 NOTE — Telephone Encounter (Signed)
Pt's spouse called to say they are still waiting for a prescription for pain medication for Pt's back.   Spouse states Pt is in a lot of pain and they really need something sent to:  [facility] [place], [place] Phone: [phone]     As soon as possible.  LOV:  09/12/22

## 2022-09-20 ENCOUNTER — Other Ambulatory Visit: Payer: Self-pay

## 2022-09-20 ENCOUNTER — Inpatient Hospital Stay: Payer: Medicaid Other | Attending: Physician Assistant | Admitting: Physician Assistant

## 2022-09-20 VITALS — BP 129/78 | HR 64 | Temp 98.1°F | Resp 14 | Wt 157.0 lb

## 2022-09-20 DIAGNOSIS — G8929 Other chronic pain: Secondary | ICD-10-CM | POA: Insufficient documentation

## 2022-09-20 DIAGNOSIS — D649 Anemia, unspecified: Secondary | ICD-10-CM | POA: Diagnosis not present

## 2022-09-20 DIAGNOSIS — Z9049 Acquired absence of other specified parts of digestive tract: Secondary | ICD-10-CM | POA: Insufficient documentation

## 2022-09-20 DIAGNOSIS — C7B02 Secondary carcinoid tumors of liver: Secondary | ICD-10-CM | POA: Insufficient documentation

## 2022-09-20 DIAGNOSIS — R160 Hepatomegaly, not elsewhere classified: Secondary | ICD-10-CM

## 2022-09-20 DIAGNOSIS — M545 Low back pain, unspecified: Secondary | ICD-10-CM

## 2022-09-20 DIAGNOSIS — M549 Dorsalgia, unspecified: Secondary | ICD-10-CM | POA: Insufficient documentation

## 2022-09-20 DIAGNOSIS — R109 Unspecified abdominal pain: Secondary | ICD-10-CM | POA: Diagnosis not present

## 2022-09-20 DIAGNOSIS — M47819 Spondylosis without myelopathy or radiculopathy, site unspecified: Secondary | ICD-10-CM | POA: Diagnosis not present

## 2022-09-20 DIAGNOSIS — Z79899 Other long term (current) drug therapy: Secondary | ICD-10-CM | POA: Insufficient documentation

## 2022-09-20 DIAGNOSIS — C7A012 Malignant carcinoid tumor of the ileum: Secondary | ICD-10-CM

## 2022-09-20 DIAGNOSIS — M2550 Pain in unspecified joint: Secondary | ICD-10-CM | POA: Diagnosis not present

## 2022-09-20 DIAGNOSIS — R112 Nausea with vomiting, unspecified: Secondary | ICD-10-CM | POA: Insufficient documentation

## 2022-09-20 DIAGNOSIS — R197 Diarrhea, unspecified: Secondary | ICD-10-CM | POA: Insufficient documentation

## 2022-09-20 NOTE — Progress Notes (Signed)
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 Multiple mildly dilated fluid-filled loops of mid small bowel, suspicious for partial small bowel obstruction, poorly defined transition point. Some mesenteric vascular congestion but no intramural air or bowel wall thickening. 2. 2.9 cm enhancing central mesenteric mass. 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 Focal segment of hyperenhancing small bowel could be potential source/site of primary. 3. Small moderate volume free fluid in the pelvis 4. Mildly prominent common bile duct which Mallory be correlated with LFTs.    05/17/2022 Imaging   CT CHEST WO CONTRAST  IMPRESSION: Indeterminate solid 3 mm right upper lobe pulmonary nodule.   Multiple liver masses best seen on recent contrast enhanced CT of the abdomen and pelvis.   05/17/2022 Tumor Marker   Patient's tumor was tested for the following markers: Chromogranin A. Results of the tumor marker test revealed elevation at 327.9.   05/18/2022 Procedure   POST-OPERATIVE DIAGNOSIS:   JEJUNAL MASS - PARTIALLY OBSTRUCTING LIVER MASSES PROBABLE METASTATIC CARCINOID    PROCEDURE:   EXPLORATORY LAPAROTOMY SMALL BOWEL RESECTION WEDGE LIVER BIOPSY   SURGEON:  Adin Hector, MD   OR FINDINGS: 15 mm small bowel nodule at junction of jejunum and ileum causing partial obstruction.  Moderately bulky lymphadenopathy in the mesentery proximal to the nodule.  En bloc resection done.  230 cm of small intestine remaining from ligament of Treitz to ileocecal valve.  Numerous miliary 14m-60 mm liver nodules.  Wedge resection done of right hepatic lobe just  lateral to the gallbladder hepatic fossa.   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       05/18/2022 Pathology Results   SURGICAL PATHOLOGY  CASE: [id number] PATIENT: Linda Villanueva  Surgical Pathology Report   FINAL MICROSCOPIC DIAGNOSIS:   A. SMALL BOWEL, MASS, RESECTION:  Well-differentiated neuroendocrine tumor, G1  Tumor measures 1.0 x 0.9 x 0.5 cm  Tumor infiltrates into the serosa (pT3)  Margins free  One of 15 lymph nodes with metastatic tumor and an associated mesenteric  mass measuring 3.2 cm (pN2) (see comment within template)  Perineural invasion present   B. LIVER, WEDGE RESECTION:  Metastatic well differentiated neuroendocrine tumor, 5 foci (ranging  from 7 mm to 0.35 mm)   Pathologic Stage Classification (pTNM, AJCC 8th Edition): pT3, pN2    05/18/2022 Cancer Staging   Staging form: Small Intestine - Other Histologies, AJCC 8th Edition - Pathologic stage from 05/18/2022: pT3, pN2, cM1 - Signed by FTruitt Merle MD on 07/11/2022 Histologic grade (G): G1 Histologic grading system: 4 grade system   06/07/2022 Initial Diagnosis   Malignant carcinoid tumor of unknown primary site ([facility]      CURRENT THERAPY: Sandostatin 20 mg injections q28 days, started 06/14/22, increased to 30 mg in 07/2022. Status post 4 cycles.   INTERVAL HISTORY: Linda Villanueva 51y.o. female returns to the clinic for an acute visit accompanied by her husband.  The patient was last seen in clinic on 09/05/2022.  She underwent a octreotide injection that day in the left gluteal region and developed low back pain that day. She believes her injection was given a little higher than usual with her  last injection. The patient does have a history of chronic back pain for which did have a MRI of the lumbar spine without contrast on 04/21/2022 which showed shallow central disc protrusion at L4/L5 with resultant mid bilateral subarticular stenosis. She states her pain at this time is  more severe than her chronic back pain and feels different. Previously, she could function without needing to take any medications. She previously used to have some radiating pain down her leg. She does not like taking medications. Even after her abdominal surgery over the summer, she rarely took any of the tramadol prescribed. She also is usually very active and walks several miles per day, however, her activity is significantly limited and she cannot stand for long periods before needing to sit down.   She saw her PCP on 09/12/22 for her worsening back pain. She had a Xray which did not show any acute process. She was prescribed Neurontin. She has not picked this up yet.   Since the onset of her recent episode of back pain on 09/05/22, her symptoms have been constant, although she has some mild-moderate improvement with tylenol. While octreotide can cause arthralgias in 1-26% of patients receiving this medication, her symptoms have not improved since onset. She denies any recent falls or injuries. Denies lifting any heavy boxes. Her pain is midline in the low back. Her pain is exacerbated by laying down, extension of her spine, and standing. She reports she has woken up at night screaming in pain. She characterizes her pain as electric/sharp. Her pain is improved with flexion of her spine (leaning forward). She took 1000 mg of tylenol this morning. With tramadol and tylenol, her pain goes from a 10/10 to a 5/10. Her current pain is not radiating down her legs like prior. She denies weakness in her lower extremities. Denies changes with her bowel or bladder habits. Denies hematuria, current diarrhea, constipation, dysuria, or cloudy urine. She was treated a few weeks ago for a dental infection with amoxicillin. Denies systemic symptoms such as fever, chills, or night sweats. She denies any changes in her RUQ pain to palpation since her abdominal surgery. Denies changes in appetite from baseline. She denies  nausea except sometimes she feels mild nausea secondary to pain.   She does not have an orthopedic provider.   Regarding her treatment with octreotide, she typically has changes in bowel habits for about 3-4 days after her treatment. Besides the back pain as listed above, she denies any other changes in her health.  She has received 4 doses. She does have metastatic disease to the liver.   MEDICAL HISTORY: Past Medical History:  Diagnosis Date   Vitamin B12 deficiency    Vitamin D deficiency     ALLERGIES:  has No Known Allergies.  MEDICATIONS:  Current Outpatient Medications  Medication Sig Dispense Refill   acetaminophen (TYLENOL) 500 MG tablet Take 2 tablets (1,000 mg total) by mouth every 6 (six) hours as needed. 30 tablet 0   traMADol (ULTRAM) 50 MG tablet Take 1 tablet (50 mg total) by mouth every 6 (six) hours as needed for severe pain (not relieved by tylenol, ibuprofen, gabapentin). 20 tablet 0   gabapentin (NEURONTIN) 100 MG capsule Take 1 capsule (100 mg total) by mouth 3 (three) times daily. (Patient not taking: Reported on 09/20/2022) 90 capsule 1   ibuprofen (ADVIL) 200 MG tablet Take 2 tablets (400 mg total) by mouth every 6 (six) hours as needed for mild pain or moderate pain. (Patient  not taking: Reported on 09/12/2022)     ondansetron (ZOFRAN) 8 MG tablet Take 1 tablet (8 mg total) by mouth every 8 (eight) hours as needed for nausea or vomiting. (Patient not taking: Reported on 09/12/2022) 30 tablet 1   polycarbophil (FIBERCON) 625 MG tablet Take 1 tablet (625 mg total) by mouth 2 (two) times daily. (Patient not taking: Reported on 09/12/2022) 30 tablet 0   No current facility-administered medications for this visit.    SURGICAL HISTORY:  Past Surgical History:  Procedure Laterality Date   INNER EAR SURGERY     LAPAROTOMY N/A 05/18/2022   Procedure: EXPLORATORY LAPAROTOMY; SMALL BOWEL RESECTION; WEDGE LIVER BIOPSY;  Surgeon: Michael Boston, MD;  Location: [facility];   Service: General;  Laterality: N/A;    REVIEW OF SYSTEMS:   Review of Systems  Constitutional: Negative for appetite change, chills, fatigue, fever and unexpected weight change.  HENT: Negative for mouth sores, nosebleeds, sore throat and trouble swallowing.   Eyes: Negative for eye problems and icterus.  Respiratory: Negative for cough, hemoptysis, shortness of breath and wheezing.   Cardiovascular: Negative for chest pain and leg swelling.  Gastrointestinal: Negative for abdominal pain, constipation, diarrhea, nausea and vomiting.  Genitourinary: Negative for bladder incontinence, difficulty urinating, dysuria, frequency and hematuria.   Musculoskeletal: Positive for acute on chronic low back pain (midline). Negative for gait problem, neck pain and neck stiffness.  Skin: Negative for itching and rash.  Neurological: Negative for dizziness, extremity weakness, gait problem, headaches, light-headedness and seizures.  Hematological: Negative for adenopathy. Does not bruise/bleed easily.  Psychiatric/Behavioral: Negative for confusion, depression and sleep disturbance. The patient is not nervous/anxious.     PHYSICAL EXAMINATION:  Blood pressure 129/78, pulse 64, temperature 98.1 F (36.7 C), temperature source Oral, resp. rate 14, weight 157 lb (71.2 kg), SpO2 100 %.  ECOG PERFORMANCE STATUS: 1  Physical Exam  Constitutional: Oriented to person, place, and time and well-developed, well-nourished, and in no distress.  HENT:  Head: Normocephalic and atraumatic.  Mouth/Throat: Oropharynx is clear and moist. No oropharyngeal exudate.  Eyes: Conjunctivae are normal. Right eye exhibits no discharge. Left eye exhibits no discharge. No scleral icterus.  Neck: Normal range of motion. Neck supple.  Cardiovascular: Normal rate, regular rhythm, normal heart sounds and intact distal pulses.   Pulmonary/Chest: Effort normal and breath sounds normal. No respiratory distress. No wheezes. No rales.   Abdominal: Soft. Mild tenderness to deep palpation in RUQ from prior surgery. Bowel sounds are normal. Exhibits no distension and no mass.  Musculoskeletal: Her ROM of her lumbar spine is limited secondary to pain. Increased pain and limited ROM with extension and twisting of her lumber spine. Pain improved with flexion although ROM was limited. Tolerates some degree of lateral bending.  Exhibits no edema. Unable to get on the exam table for more extensive PE/special testing of low back.  Lymphadenopathy:    No cervical adenopathy.  Neurological: Alert and oriented to person, place, and time. Exhibits normal muscle tone. Gait normal. Coordination normal. Strength 5/5 bilateral in lower extremities.  Skin: Skin is warm and dry. No rash noted. Not diaphoretic. No erythema. No pallor.  Psychiatric: Mood, memory and judgment normal.  Vitals reviewed.  LABORATORY DATA: Lab Results  Component Value Date   WBC 6.3 09/05/2022   HGB 11.3 (L) 09/05/2022   HCT 34.8 (L) 09/05/2022   MCV 79.8 (L) 09/05/2022   PLT 349 09/05/2022      Chemistry      Component Value  Date/Time   NA 140 09/05/2022 1350   K 3.6 09/05/2022 1350   CL 107 09/05/2022 1350   CO2 27 09/05/2022 1350   BUN 10 09/05/2022 1350   CREATININE 0.50 09/05/2022 1350      Component Value Date/Time   CALCIUM 8.5 (L) 09/05/2022 1350   ALKPHOS 73 09/05/2022 1350   AST 11 (L) 09/05/2022 1350   ALT 8 09/05/2022 1350   BILITOT 0.4 09/05/2022 1350       RADIOGRAPHIC STUDIES:  DG Lumbar Spine Complete  Result Date: 09/12/2022 CLINICAL DATA:  Back pain EXAM: LUMBAR SPINE - COMPLETE 4 VIEW COMPARISON:  None Available. FINDINGS: There is no evidence of lumbar spine fracture. Alignment is normal. Intervertebral disc spaces are maintained. IMPRESSION: Negative. Electronically Signed   By: Yetta Glassman M.D.   On: 09/12/2022 14:49     ASSESSMENT/PLAN:   Linda Villanueva is a 51 y.o. female with    1. 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   -presented with upper abdominal pain with nausea and vomiting. CT 05/16/22 revealed: 2.9 cm mesenteric mass; multiple liver lesions; focal segment of hyperenhancing small bowel; findings suspicious for partial small bowel obstruction.  -S/p emergent resection on 05/18/22 by Dr. Johney Maine. Small bowel mass pathology showed 1 cm well-differentiated NET, grade 1, infiltrating serosa, and an associated 3.2 cm mesenteric mass. Margins negative, 1/15 positive lymph nodes. Liver pathology confirmed metastatic NET. -Baseline chromogranin A on 05/17/22 elevated at 327.9.  This decreased to 286 after starting treatment -24hr urine obtained 06/11/22 showed elevated 5-HIAA. -she began first-line sandostatin injections 07/11/22, increased to full dose in September. -She received her last injection on 09/05/22. tolerating monthly Sandostatin with injection site and body pain. Pain worsened after most recent injection on 09/05/22. Cramping and diarrhea peak from days 2-7 then bowel movements become almost normal.  Overall the NET symptoms have improved since starting treatment -Since last being seen, the patient is endorsing significant low back pain which started on 09/05/2022 (see below #2) -Reviewed with Dr. Burr Medico, since patient has had 4 doses of treatment, Dr. Burr Medico recommends repeat CT of the abdomen pelvis this month.  I have placed the order.  Once this is scheduled, I will send a scheduling message to make a follow-up visit a few days later to review the results. -Her next injection is scheduled on 10/03/22.  Of note, the patient reports that she prefers female staff members.  I have placed a note in the injection appointments that she prefers this to be administered by a female staff member.   2. Arthralgia/Low back pain -She has a history of tolerable chronic low back pain for which she had an MRI on 04/21/2022 showing shallow  central disc protrusion at L4/L5 with resultant mild bilateral subarticular stenosis.  There is mild bilateral facet hypertrophy at L5 and S1 without stenosis -Since ejection on 09/05/2022, developed constant, electric/sharp, midline, lumbar back pain.  No associated urinary, bowel, or weakness in her lower extremities.  Denies systemic symptoms -X-ray of the lumbar spine performed by PCPs office on 09/12/2022 without any clear etiology.  Neurontin prescribed but she has not been taking this -Today (09/20/22), I can tell that the pain is significant for the patient.  She typically is active with daily walks and avoids taking any medications.  She appears uncomfortable when standing or ambulating.  She had decreased range of motion of the lumbar spine, particularly with  extension.  -From her exam and history, I feel this is likely an orthopedic etiology such as spinal stenosis or herniated disc.  -Since her pain is severely limiting her usual day-to-day activities and causing her to wake up at night screaming in pain, I do not want to delay her getting the proper care.  Therefore, we gave the patient the information for the [facility] urgent care, to see if she can be evaluated today.  I will call her later today to ensure that she was evaluated.  -If she is unable to be seen by an orthopedic provider today, then I discussed I will send prescription strength NSAIDs to the pharmacy to alternate with her Tylenol.  I would also consider sending her Lidoderm patches and place an urgent referral to Ortho. -Of note, from a cancer standpoint we will obtain a repeat CT scan of the abdomen pelvis this month to ensure no malignant etiology of her pain.  Her chromogranin A has been downtrending since starting treatment and she also is having approved mention of her symptoms related to her malignancy since diagnosed.    3. Abdominal pain, nausea, diarrhea, flushing -She presented to ER with abdominal pain, nausea,  diarrhea, and flushing. -she has occasional abdominal pain, more manageable.  Today(09/20/22), this is stable and she denies any changes with this. -Cramping and diarrhea peak 2-7 days after treatment but overall have improved since starting Sandostatin  -She has improved on treatment.  Denies any cramping or diarrhea at this time.   4. Social -Lives with her husband, [name], and 10 family members in a house.  The patient states it is a large house and there is space for everyone. 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 Her husband works as a [employer]chant [employer] officer. -She met with social work  -The patient hopes to visit her mother in [place]. We discussed again today-- Dr. Burr Medico previously recommend waiting until after receiving 2-3 injections that we would be happy to work with her travel schedule in the future.   4. Anemia -likely secondary to surgery.  Improving      Plan: -Structured to go to [facility] urgent care. -We will call her later today to ensure she was able to be seen.  If she is not able to be seen, I will send prescription NSAIDs, Lidoderm patches, and place an urgent referral to orthopedics.  I gave the patient a copy of her recent blood work showing she has normal kidney function, recent chest x-ray results, and prior MRI of the spine results to share with the orthopedic provider in case they do not have access to these records. -Repeat CT of the abdomen pelvis this month to restage her disease. -Scheduled for her next injection on 11/16.  I will make a follow-up visit after her repeat CT scan to review the results.  I will keep a look out for when her CT scan is scheduled. -Reviewed the plan with Dr. Burr Medico who is in agreement.  Follow up: I called and spoke to the patient's husband, who stated that she was given two prescriptions and has a follow up scheduled with ortho to assess for improvement or to see if further workup is needed.  He was appreciative of the call.    Orders Placed This Encounter  Procedures   CT Abdomen Pelvis W Contrast    Standing Status:   Future    Standing Expiration Date:   09/20/2023  Order Specific Question:   If indicated for the ordered procedure, I authorize the administration of contrast media per Radiology protocol    Answer:   Yes    Order Specific Question:   Is patient pregnant?    Answer:   No    Order Specific Question:   Preferred imaging location?    Answer:   [facility]    Order Specific Question:   Is Oral Contrast requested for this exam?    Answer:   Yes, Per Radiology protocol     The total time spent in the appointment was 20-29 minutes  [place], PA-C 09/20/22

## 2022-09-23 ENCOUNTER — Inpatient Hospital Stay: Payer: Medicaid Other | Admitting: Licensed Clinical Social Worker

## 2022-09-23 DIAGNOSIS — C7A012 Malignant carcinoid tumor of the ileum: Secondary | ICD-10-CM

## 2022-09-23 NOTE — Progress Notes (Signed)
[place] CSW Progress Note  Holiday representative  spoke w/ pt over the phone regarding the Giving [employer].    Pt emailed list to [place] which has been added to the Giving Tree.  CSW to contact pt once items have been purchased for pick up.     Henriette Combs, LCSW    Patient is participating in a Managed Medicaid Plan:  Yes

## 2022-09-24 ENCOUNTER — Encounter: Payer: Self-pay | Admitting: Family Medicine

## 2022-09-24 ENCOUNTER — Encounter: Payer: Self-pay | Admitting: Hematology

## 2022-09-24 NOTE — Progress Notes (Signed)
I called pt to discuss the [employer].  Pt would like to apply and since her and her husband are not receiving income at this time she will provide a letter of support.  Once received I will approve her for the grant.

## 2022-09-24 NOTE — Progress Notes (Signed)
Pt is approved for the $1000 [employer] grant.  

## 2022-10-03 ENCOUNTER — Other Ambulatory Visit: Payer: Self-pay

## 2022-10-03 ENCOUNTER — Inpatient Hospital Stay: Payer: Medicaid Other

## 2022-10-03 VITALS — BP 126/82 | HR 70 | Temp 98.5°F | Resp 18

## 2022-10-03 DIAGNOSIS — C7A012 Malignant carcinoid tumor of the ileum: Secondary | ICD-10-CM

## 2022-10-03 MED ORDER — OCTREOTIDE ACETATE 30 MG IM KIT
30.0000 mg | PACK | Freq: Once | INTRAMUSCULAR | Status: AC
  Administered 2022-10-03: 30 mg via INTRAMUSCULAR
  Filled 2022-10-03: qty 1

## 2022-10-08 ENCOUNTER — Ambulatory Visit (HOSPITAL_COMMUNITY)
Admission: RE | Admit: 2022-10-08 | Discharge: 2022-10-08 | Disposition: A | Discharge status: Home / Self Care | Payer: Medicaid Other | Source: Ambulatory Visit | Attending: Physician Assistant | Admitting: Physician Assistant

## 2022-10-08 DIAGNOSIS — Z9049 Acquired absence of other specified parts of digestive tract: Secondary | ICD-10-CM | POA: Diagnosis not present

## 2022-10-08 DIAGNOSIS — C7A1 Malignant poorly differentiated neuroendocrine tumors: Secondary | ICD-10-CM | POA: Diagnosis not present

## 2022-10-08 DIAGNOSIS — C7A012 Malignant carcinoid tumor of the ileum: Secondary | ICD-10-CM | POA: Diagnosis present

## 2022-10-08 DIAGNOSIS — Z8603 Personal history of neoplasm of uncertain behavior: Secondary | ICD-10-CM | POA: Diagnosis not present

## 2022-10-08 DIAGNOSIS — R160 Hepatomegaly, not elsewhere classified: Secondary | ICD-10-CM | POA: Diagnosis present

## 2022-10-08 DIAGNOSIS — C787 Secondary malignant neoplasm of liver and intrahepatic bile duct: Secondary | ICD-10-CM | POA: Diagnosis not present

## 2022-10-08 MED ORDER — IOHEXOL 300 MG/ML  SOLN
100.0000 mL | Freq: Once | INTRAMUSCULAR | Status: AC | PRN
  Administered 2022-10-08: 100 mL via INTRAVENOUS

## 2022-10-09 DIAGNOSIS — M5451 Vertebrogenic low back pain: Secondary | ICD-10-CM | POA: Diagnosis not present

## 2022-10-15 ENCOUNTER — Inpatient Hospital Stay (HOSPITAL_BASED_OUTPATIENT_CLINIC_OR_DEPARTMENT_OTHER): Payer: Medicaid Other | Admitting: Hematology

## 2022-10-15 ENCOUNTER — Other Ambulatory Visit: Payer: Self-pay

## 2022-10-15 VITALS — BP 140/71 | HR 63 | Temp 97.9°F | Resp 16 | Ht 67.0 in | Wt 159.8 lb

## 2022-10-15 DIAGNOSIS — C7A012 Malignant carcinoid tumor of the ileum: Secondary | ICD-10-CM | POA: Diagnosis not present

## 2022-10-15 NOTE — Progress Notes (Unsigned)
[place]   [phone] [phone]   Clinic Follow up Note   Patient Care Team: Linda Ruddy, MD as PCP - General (Family Medicine) Linda Boston, MD as Consulting Physician (General Surgery) Linda Merle, MD as Consulting Physician (Oncology)  Date of Service:  10/15/2022  CHIEF COMPLAINT: f/u of  metastatic carcinoid tumor    CURRENT THERAPY:  Sandostatin 20 mg injections q28 days, started 06/14/22, increased to 30 mg in 07/2022. Status post 4 cycles.    ASSESSMENT:  Linda Villanueva is a 51 y.o. female with    1. 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   -presented with upper abdominal pain with nausea and vomiting. CT 05/16/22 revealed: 2.9 cm mesenteric mass; multiple liver lesions; focal segment of hyperenhancing small bowel; findings suspicious for partial small bowel obstruction.  -S/p emergent resection on 05/18/22 by Dr. Johney Villanueva. Small bowel mass pathology showed 1 cm well-differentiated NET, grade 1, infiltrating serosa, and an associated 3.2 cm mesenteric mass. Margins negative, 1/15 positive lymph nodes. Liver pathology confirmed metastatic NET. -Baseline chromogranin A on 05/17/22 elevated at 327.9. -24hr urine obtained 06/11/22 showed elevated 5-HIAA. -she is scheduled to start sandostatin injections today, 07/11/22. Labs reviewed, overall improved since hospital discharge. Her chromogranin A is still pending, hopefully it dropped with surgery. Will proceed with sandostatin today and continue every 4 weeks    2. Abdominal pain, nausea, diarrhea, flushing -She presented to ER with abdominal pain, nausea, diarrhea, and flushing. -she has occasional abdominal pain now, more manageable. -she notes continued diarrhea, 3 BM/day. -She has improved but persistent flushing, likely secondary to carcinoid tumor.    3. Social -Lives with her husband, Linda Villanueva, and 10 family members  in a house.  The patient states it is a large house and there is space for everyone. 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 Her husband works as a [employer] officer. -She met with social work  -The patient hopes to visit her mother in [place]. We discussed again today-- I recommend waiting until after receiving 2-3 injections that we would be happy to work with her travel schedule in the future.   4. Anemia -likely secondary to surgery.   -hgb improved to 11.2 today (07/11/22), will monitor on treatment  No problem-specific Assessment & Plan notes found for this encounter.     PLAN: -Discuss Ct Scan spots on liver are smaller -Injection q4 wks x3 11/28/2022 -Lab f/u with last schedule injection.     SUMMARY OF ONCOLOGIC HISTORY: Oncology History  Malignant carcinoid tumor of small intestine ([place])  05/16/2022 Imaging   CT ABDOMEN PELVIS W CONTRAST    IMPRESSION: 1. Multiple mildly dilated fluid-filled loops of mid small bowel, suspicious for partial small bowel obstruction, poorly defined transition point. Some mesenteric vascular congestion but no intramural air or bowel wall thickening. 2. 2.9 cm enhancing central mesenteric mass. 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 Focal segment of hyperenhancing small bowel could be potential source/site of primary. 3. Small moderate volume free fluid in the pelvis 4. Mildly prominent common bile duct which Linda Villanueva be correlated with LFTs.    05/17/2022 Imaging   CT CHEST WO CONTRAST  IMPRESSION: Indeterminate solid 3 mm right upper lobe pulmonary nodule.   Multiple liver masses best seen on recent contrast enhanced CT of the abdomen and  pelvis.   05/17/2022 Tumor Marker   Patient's tumor was tested for the following markers: Chromogranin A. Results of the tumor marker test revealed elevation at 327.9.    05/18/2022 Procedure   POST-OPERATIVE DIAGNOSIS:   JEJUNAL MASS - PARTIALLY OBSTRUCTING LIVER MASSES PROBABLE METASTATIC CARCINOID    PROCEDURE:   EXPLORATORY LAPAROTOMY SMALL BOWEL RESECTION WEDGE LIVER BIOPSY   SURGEON:  Linda Hector, MD   OR FINDINGS: 15 mm small bowel nodule at junction of jejunum and ileum causing partial obstruction.  Moderately bulky lymphadenopathy in the mesentery proximal to the nodule.  En bloc resection done.  230 cm of small intestine remaining from ligament of Treitz to ileocecal valve.  Numerous miliary 37m-60 mm liver nodules.  Wedge resection done of right hepatic lobe just lateral to the gallbladder hepatic fossa.   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       05/18/2022 Pathology Results   SURGICAL PATHOLOGY  CASE: [phone] PATIENT: Linda Villanueva  Surgical Pathology Report   FINAL MICROSCOPIC DIAGNOSIS:   A. SMALL BOWEL, MASS, RESECTION:  Well-differentiated neuroendocrine tumor, G1  Tumor measures 1.0 x 0.9 x 0.5 cm  Tumor infiltrates into the serosa (pT3)  Margins free  One of 15 lymph nodes with metastatic tumor and an associated mesenteric  mass measuring 3.2 cm (pN2) (see comment within template)  Perineural invasion present   B. LIVER, WEDGE RESECTION:  Metastatic well differentiated neuroendocrine tumor, 5 foci (ranging  from 7 mm to 0.35 mm)   Pathologic Stage Classification (pTNM, AJCC 8th Edition): pT3, pN2    05/18/2022 Cancer Staging   Staging form: Small Intestine - Other Histologies, AJCC 8th Edition - Pathologic stage from 05/18/2022: pT3, pN2, cM1 - Signed by FTruitt Merle MD on 07/11/2022 Histologic grade (G): G1 Histologic grading system: 4 grade system   06/07/2022 Initial Diagnosis   Malignant carcinoid tumor of unknown primary site ([facility]      INTERVAL HISTORY:  Linda Villanueva here for a follow up of  metastatic carcinoid tumor.She was last seen by me on 07/11/2022 She  presents to the clinic alone.Pt reports she feels well.Pt states after surgery she has no diarrhea.Pt report of having camps intermittent on the right side.Pt states the pain lasted between 30-40 mins. Pt states back pain is better, went to orthopedics continue f/u. and refer to physical therapy.Pt has mention that her weight is going up.   All other systems were reviewed with the patient and are negative.  MEDICAL HISTORY:  Past Medical History:  Diagnosis Date   Vitamin B12 deficiency    Vitamin D deficiency     SURGICAL HISTORY: Past Surgical History:  Procedure Laterality Date   INNER EAR SURGERY     LAPAROTOMY N/A 05/18/2022   Procedure: EXPLORATORY LAPAROTOMY; SMALL BOWEL RESECTION; WEDGE LIVER BIOPSY;  Surgeon: GMichael Boston MD;  Location: [facility];  Service: General;  Laterality: N/A;    I have reviewed the social history and family history with the patient and they are unchanged from previous note.  ALLERGIES:  has No Known Allergies.  MEDICATIONS:  Current Outpatient Medications  Medication Sig Dispense Refill   acetaminophen (TYLENOL) 500 MG tablet Take 2 tablets (1,000 mg total) by mouth every 6 (six) hours as needed. 30 tablet 0   gabapentin (NEURONTIN) 100 MG capsule Take 1 capsule (100 mg total) by mouth 3 (three) times daily. (Patient not taking: Reported on 09/20/2022) 90 capsule 1   ibuprofen (ADVIL)  200 MG tablet Take 2 tablets (400 mg total) by mouth every 6 (six) hours as needed for mild pain or moderate pain. (Patient not taking: Reported on 09/12/2022)     ondansetron (ZOFRAN) 8 MG tablet Take 1 tablet (8 mg total) by mouth every 8 (eight) hours as needed for nausea or vomiting. (Patient not taking: Reported on 09/12/2022) 30 tablet 1   polycarbophil (FIBERCON) 625 MG tablet Take 1 tablet (625 mg total) by mouth 2 (two) times daily. (Patient not taking: Reported on 09/12/2022) 30 tablet 0   traMADol (ULTRAM) 50 MG tablet Take 1 tablet (50 mg total) by mouth every 6  (six) hours as needed for severe pain (not relieved by tylenol, ibuprofen, gabapentin). 20 tablet 0   No current facility-administered medications for this visit.    PHYSICAL EXAMINATION: ECOG PERFORMANCE STATUS: {CHL ONC ECOG MW:[id number]}  Vitals:   10/15/22 1338  BP: (!) 140/71  Pulse: 63  Resp: 16  Temp: 97.9 F (36.6 C)  SpO2: 100%   Wt Readings from Last 3 Encounters:  10/15/22 159 lb 12.8 oz (72.5 kg)  09/20/22 157 lb (71.2 kg)  09/12/22 161 lb (73 kg)    {Only keep what was examined. If exam not performed, can use .CEXAM } GENERAL:alert, no distress and comfortable SKIN: skin color, texture, turgor are normal, no rashes or significant lesions EYES: normal, Conjunctiva are pink and non-injected, sclera clear {OROPHARYNX:no exudate, no erythema and lips, buccal mucosa, and tongue normal}  NECK: supple, thyroid normal size, non-tender, without nodularity LYMPH:  no palpable lymphadenopathy in the cervical, axillary {or inguinal} LUNGS: clear to auscultation and percussion with normal breathing effort HEART: regular rate & rhythm and no murmurs and no lower extremity edema ABDOMEN:abdomen soft, non-tender and normal bowel sounds Musculoskeletal:no cyanosis of digits and no clubbing  NEURO: alert & oriented x 3 with fluent speech, no focal motor/sensory deficits  LABORATORY DATA:  I have reviewed the data as listed    Latest Ref Rng & Units 09/05/2022    1:50 PM 07/11/2022    1:21 PM 05/22/2022   12:33 AM  CBC  WBC 4.0 - 10.5 K/uL 6.3  5.9  9.5   Hemoglobin 12.0 - 15.0 g/dL 11.3  11.2  9.5   Hematocrit 36.0 - 46.0 % 34.8  33.9  29.1   Platelets 150 - 400 K/uL 349  329  311         Latest Ref Rng & Units 09/05/2022    1:50 PM 07/11/2022    1:21 PM 05/22/2022   12:33 AM  CMP  Glucose 70 - 99 mg/dL 101  97    BUN 6 - 20 mg/dL 10  10    Creatinine 0.44 - 1.00 mg/dL 0.50  0.47  0.45   Sodium 135 - 145 mmol/L 140  137    Potassium 3.5 - 5.1 mmol/L 3.6  3.5  3.7    Chloride 98 - 111 mmol/L 107  108    CO2 22 - 32 mmol/L 27  26    Calcium 8.9 - 10.3 mg/dL 8.5  8.9    Total Protein 6.5 - 8.1 g/dL 6.8  7.0    Total Bilirubin 0.3 - 1.2 mg/dL 0.4  0.5    Alkaline Phos 38 - 126 U/L 73  76    AST 15 - 41 U/L 11  12    ALT 0 - 44 U/L 8  9        RADIOGRAPHIC STUDIES: I have  personally reviewed the radiological images as listed and agreed with the findings in the report. No results found.    No orders of the defined types were placed in this encounter.  All questions were answered. The patient knows to call the clinic with any problems, questions or concerns. No barriers to learning was detected. The total time spent in the appointment was {CHL ONC TIME VISIT - OMQTT:[id number]}.     Baldemar Friday, CMA 10/15/2022   I, Audry Riles, CMA, am acting as scribe for Linda Merle, MD.   {Add scribe attestation statement}

## 2022-10-16 ENCOUNTER — Encounter: Payer: Self-pay | Admitting: Hematology

## 2022-10-16 ENCOUNTER — Telehealth: Payer: Self-pay | Admitting: Hematology

## 2022-10-16 NOTE — Telephone Encounter (Signed)
Called patient and left voicemail of upcoming appointments.

## 2022-10-23 DIAGNOSIS — M5451 Vertebrogenic low back pain: Secondary | ICD-10-CM | POA: Diagnosis not present

## 2022-10-31 ENCOUNTER — Inpatient Hospital Stay: Payer: Medicaid Other | Attending: Physician Assistant

## 2022-10-31 ENCOUNTER — Other Ambulatory Visit: Payer: Self-pay

## 2022-10-31 VITALS — BP 141/72 | HR 70 | Temp 98.0°F | Resp 16

## 2022-10-31 DIAGNOSIS — C7A012 Malignant carcinoid tumor of the ileum: Secondary | ICD-10-CM | POA: Insufficient documentation

## 2022-10-31 DIAGNOSIS — C7B02 Secondary carcinoid tumors of liver: Secondary | ICD-10-CM | POA: Insufficient documentation

## 2022-10-31 DIAGNOSIS — R232 Flushing: Secondary | ICD-10-CM | POA: Insufficient documentation

## 2022-10-31 DIAGNOSIS — R197 Diarrhea, unspecified: Secondary | ICD-10-CM | POA: Diagnosis not present

## 2022-10-31 MED ORDER — OCTREOTIDE ACETATE 30 MG IM KIT
30.0000 mg | PACK | Freq: Once | INTRAMUSCULAR | Status: AC
  Administered 2022-10-31: 30 mg via INTRAMUSCULAR
  Filled 2022-10-31: qty 1

## 2022-11-01 DIAGNOSIS — M5451 Vertebrogenic low back pain: Secondary | ICD-10-CM | POA: Diagnosis not present

## 2022-11-28 ENCOUNTER — Inpatient Hospital Stay: Payer: Medicaid Other | Attending: Physician Assistant

## 2022-11-28 ENCOUNTER — Other Ambulatory Visit: Payer: Self-pay

## 2022-11-28 VITALS — BP 125/78 | HR 70 | Temp 98.6°F | Resp 18

## 2022-11-28 DIAGNOSIS — C7B02 Secondary carcinoid tumors of liver: Secondary | ICD-10-CM | POA: Diagnosis present

## 2022-11-28 DIAGNOSIS — C7A012 Malignant carcinoid tumor of the ileum: Secondary | ICD-10-CM | POA: Insufficient documentation

## 2022-11-28 MED ORDER — OCTREOTIDE ACETATE 30 MG IM KIT
30.0000 mg | PACK | Freq: Once | INTRAMUSCULAR | Status: AC
  Administered 2022-11-28: 30 mg via INTRAMUSCULAR
  Filled 2022-11-28: qty 1

## 2022-12-26 ENCOUNTER — Other Ambulatory Visit: Payer: Self-pay

## 2022-12-26 ENCOUNTER — Inpatient Hospital Stay: Payer: Medicaid Other | Attending: Physician Assistant

## 2022-12-26 VITALS — BP 148/90 | HR 79 | Temp 98.4°F | Resp 16

## 2022-12-26 DIAGNOSIS — C7A012 Malignant carcinoid tumor of the ileum: Secondary | ICD-10-CM | POA: Insufficient documentation

## 2022-12-26 DIAGNOSIS — C7B02 Secondary carcinoid tumors of liver: Secondary | ICD-10-CM | POA: Diagnosis present

## 2022-12-26 MED ORDER — OCTREOTIDE ACETATE 30 MG IM KIT
30.0000 mg | PACK | Freq: Once | INTRAMUSCULAR | Status: AC
  Administered 2022-12-26: 30 mg via INTRAMUSCULAR
  Filled 2022-12-26: qty 1

## 2023-01-07 ENCOUNTER — Telehealth: Payer: Self-pay | Admitting: Hematology

## 2023-01-07 NOTE — Telephone Encounter (Signed)
Contacted patient to scheduled appointments. Patient is aware of appointments that are scheduled.   

## 2023-01-22 NOTE — Progress Notes (Unsigned)
Patient Care Team: Billie Ruddy, MD as PCP - General (Family Medicine) Michael Boston, MD as Consulting Physician (General Surgery) Truitt Merle, MD as Consulting Physician (Oncology)   CHIEF COMPLAINT: Follow-up carcinoid tumor of the small bowel  Oncology History  Malignant carcinoid tumor of small intestine ([place])  05/16/2022 Imaging   CT ABDOMEN PELVIS W CONTRAST    IMPRESSION: 1. Multiple mildly dilated fluid-filled loops of mid small bowel, suspicious for partial small bowel obstruction, poorly defined transition point. Some mesenteric vascular congestion but no intramural air or bowel wall thickening. 2. 2.9 cm enhancing central mesenteric mass. 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 Focal segment of hyperenhancing small bowel could be potential source/site of primary. 3. Small moderate volume free fluid in the pelvis 4. Mildly prominent common bile duct which Hugh be correlated with LFTs.    05/17/2022 Imaging   CT CHEST WO CONTRAST  IMPRESSION: Indeterminate solid 3 mm right upper lobe pulmonary nodule.   Multiple liver masses best seen on recent contrast enhanced CT of the abdomen and pelvis.   05/17/2022 Tumor Marker   Patient's tumor was tested for the following markers: Chromogranin A. Results of the tumor marker test revealed elevation at 327.9.   05/18/2022 Procedure   POST-OPERATIVE DIAGNOSIS:   JEJUNAL MASS - PARTIALLY OBSTRUCTING LIVER MASSES PROBABLE METASTATIC CARCINOID    PROCEDURE:   EXPLORATORY LAPAROTOMY SMALL BOWEL RESECTION WEDGE LIVER BIOPSY   SURGEON:  Adin Hector, MD   OR FINDINGS: 15 mm small bowel nodule at junction of jejunum and ileum causing partial obstruction.  Moderately bulky lymphadenopathy in the mesentery proximal to the nodule.  En bloc resection done.  230 cm of small intestine remaining from ligament of Treitz to ileocecal  valve.  Numerous miliary 63m-60 mm liver nodules.  Wedge resection done of right hepatic lobe just lateral to the gallbladder hepatic fossa.   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       05/18/2022 Pathology Results   SURGICAL PATHOLOGY  CASE: [id number] PATIENT: Linda Villanueva  Surgical Pathology Report   FINAL MICROSCOPIC DIAGNOSIS:   A. SMALL BOWEL, MASS, RESECTION:  Well-differentiated neuroendocrine tumor, G1  Tumor measures 1.0 x 0.9 x 0.5 cm  Tumor infiltrates into the serosa (pT3)  Margins free  One of 15 lymph nodes with metastatic tumor and an associated mesenteric  mass measuring 3.2 cm (pN2) (see comment within template)  Perineural invasion present   B. LIVER, WEDGE RESECTION:  Metastatic well differentiated neuroendocrine tumor, 5 foci (ranging  from 7 mm to 0.35 mm)   Pathologic Stage Classification (pTNM, AJCC 8th Edition): pT3, pN2    05/18/2022 Cancer Staging   Staging form: Small Intestine - Other Histologies, AJCC 8th Edition - Pathologic stage from 05/18/2022: pT3, pN2, cM1 - Signed by FTruitt Merle MD on 07/11/2022 Histologic grade (G): G1 Histologic grading system: 4 grade system   06/07/2022 Initial Diagnosis   Malignant carcinoid tumor of unknown primary site ([facility]      CURRENT THERAPY: Sandostatin 20 mg injections q28 days, started 06/14/22, increased to 30 mg in 07/2022. Status post 4 cycles.     INTERVAL HISTORY Linda YLehlreturns for follow-up and treatment as scheduled, last seen by Dr. FBurr Medico11/28/2023.  Continues monthly Sandostatin, last dose 12/26/2022  ROS   Past Medical History:  Diagnosis Date   Vitamin B12 deficiency    Vitamin D deficiency  Past Surgical History:  Procedure Laterality Date   INNER EAR SURGERY     LAPAROTOMY N/A 05/18/2022   Procedure: EXPLORATORY LAPAROTOMY; SMALL BOWEL RESECTION; WEDGE LIVER BIOPSY;  Surgeon: Michael Boston, MD;  Location: [facility];  Service: General;  Laterality: N/A;      Outpatient Encounter Medications as of 01/23/2023  Medication Sig   acetaminophen (TYLENOL) 500 MG tablet Take 2 tablets (1,000 mg total) by mouth every 6 (six) hours as needed.   gabapentin (NEURONTIN) 100 MG capsule Take 1 capsule (100 mg total) by mouth 3 (three) times daily. (Patient not taking: Reported on 09/20/2022)   ibuprofen (ADVIL) 200 MG tablet Take 2 tablets (400 mg total) by mouth every 6 (six) hours as needed for mild pain or moderate pain. (Patient not taking: Reported on 09/12/2022)   ondansetron (ZOFRAN) 8 MG tablet Take 1 tablet (8 mg total) by mouth every 8 (eight) hours as needed for nausea or vomiting. (Patient not taking: Reported on 09/12/2022)   polycarbophil (FIBERCON) 625 MG tablet Take 1 tablet (625 mg total) by mouth 2 (two) times daily. (Patient not taking: Reported on 09/12/2022)   traMADol (ULTRAM) 50 MG tablet Take 1 tablet (50 mg total) by mouth every 6 (six) hours as needed for severe pain (not relieved by tylenol, ibuprofen, gabapentin).   No facility-administered encounter medications on file as of 01/23/2023.     There were no vitals filed for this visit. There is no height or weight on file to calculate BMI.   PHYSICAL EXAM GENERAL:alert, no distress and comfortable SKIN: no rash  EYES: sclera clear NECK: without mass LYMPH:  no palpable cervical or supraclavicular lymphadenopathy  LUNGS: clear with normal breathing effort HEART: regular rate & rhythm, no lower extremity edema ABDOMEN: abdomen soft, non-tender and normal bowel sounds NEURO: alert & oriented x 3 with fluent speech, no focal motor/sensory deficits Breast exam:  PAC without erythema    CBC    Component Value Date/Time   WBC 6.3 09/05/2022 1350   RBC 4.36 09/05/2022 1350   HGB 11.3 (L) 09/05/2022 1350   HCT 34.8 (L) 09/05/2022 1350   PLT 349 09/05/2022 1350   MCV 79.8 (L) 09/05/2022 1350   MCH 25.9 (L) 09/05/2022 1350   MCHC 32.5 09/05/2022 1350   RDW 15.4 09/05/2022 1350    LYMPHSABS 3.1 09/05/2022 1350   MONOABS 0.4 09/05/2022 1350   EOSABS 0.1 09/05/2022 1350   BASOSABS 0.1 09/05/2022 1350     CMP     Component Value Date/Time   NA 140 09/05/2022 1350   K 3.6 09/05/2022 1350   CL 107 09/05/2022 1350   CO2 27 09/05/2022 1350   GLUCOSE 101 (H) 09/05/2022 1350   BUN 10 09/05/2022 1350   CREATININE 0.50 09/05/2022 1350   CALCIUM 8.5 (L) 09/05/2022 1350   PROT 6.8 09/05/2022 1350   ALBUMIN 3.8 09/05/2022 1350   AST 11 (L) 09/05/2022 1350   ALT 8 09/05/2022 1350   ALKPHOS 73 09/05/2022 1350   BILITOT 0.4 09/05/2022 1350   GFRNONAA >60 09/05/2022 1350   GFRAA >60 06/03/2018 2052     ASSESSMENT & PLAN:  PLAN:  No orders of the defined types were placed in this encounter.     All questions were answered. The patient knows to call the clinic with any problems, questions or concerns. No barriers to learning were detected. I spent *** counseling the patient face to face. The total time spent in the appointment was *** and more  than 50% was on counseling, review of test results, and coordination of care.   Cira Rue, NP-C '@DATE'$ @

## 2023-01-23 ENCOUNTER — Inpatient Hospital Stay: Payer: Medicaid Other

## 2023-01-23 ENCOUNTER — Encounter: Payer: Self-pay | Admitting: Nurse Practitioner

## 2023-01-23 ENCOUNTER — Inpatient Hospital Stay (HOSPITAL_BASED_OUTPATIENT_CLINIC_OR_DEPARTMENT_OTHER): Payer: Medicaid Other | Admitting: Nurse Practitioner

## 2023-01-23 ENCOUNTER — Other Ambulatory Visit: Payer: Self-pay

## 2023-01-23 ENCOUNTER — Inpatient Hospital Stay: Payer: Medicaid Other | Attending: Physician Assistant

## 2023-01-23 VITALS — BP 141/93 | HR 73 | Temp 98.8°F | Resp 18 | Wt 157.7 lb

## 2023-01-23 DIAGNOSIS — R5383 Other fatigue: Secondary | ICD-10-CM | POA: Insufficient documentation

## 2023-01-23 DIAGNOSIS — D649 Anemia, unspecified: Secondary | ICD-10-CM | POA: Insufficient documentation

## 2023-01-23 DIAGNOSIS — N920 Excessive and frequent menstruation with regular cycle: Secondary | ICD-10-CM | POA: Diagnosis not present

## 2023-01-23 DIAGNOSIS — R232 Flushing: Secondary | ICD-10-CM | POA: Diagnosis not present

## 2023-01-23 DIAGNOSIS — Z79899 Other long term (current) drug therapy: Secondary | ICD-10-CM | POA: Insufficient documentation

## 2023-01-23 DIAGNOSIS — C7A012 Malignant carcinoid tumor of the ileum: Secondary | ICD-10-CM | POA: Insufficient documentation

## 2023-01-23 DIAGNOSIS — R160 Hepatomegaly, not elsewhere classified: Secondary | ICD-10-CM

## 2023-01-23 DIAGNOSIS — Z8249 Family history of ischemic heart disease and other diseases of the circulatory system: Secondary | ICD-10-CM | POA: Insufficient documentation

## 2023-01-23 DIAGNOSIS — C7B02 Secondary carcinoid tumors of liver: Secondary | ICD-10-CM | POA: Insufficient documentation

## 2023-01-23 DIAGNOSIS — R197 Diarrhea, unspecified: Secondary | ICD-10-CM | POA: Diagnosis not present

## 2023-01-23 DIAGNOSIS — Z9049 Acquired absence of other specified parts of digestive tract: Secondary | ICD-10-CM | POA: Insufficient documentation

## 2023-01-23 DIAGNOSIS — E538 Deficiency of other specified B group vitamins: Secondary | ICD-10-CM | POA: Insufficient documentation

## 2023-01-23 LAB — CBC WITH DIFFERENTIAL/PLATELET
Abs Immature Granulocytes: 0.01 10*3/uL (ref 0.00–0.07)
Basophils Absolute: 0.1 10*3/uL (ref 0.0–0.1)
Basophils Relative: 1 %
Eosinophils Absolute: 0.2 10*3/uL (ref 0.0–0.5)
Eosinophils Relative: 3 %
HCT: 36.3 % (ref 36.0–46.0)
Hemoglobin: 11.8 g/dL — ABNORMAL LOW (ref 12.0–15.0)
Immature Granulocytes: 0 %
Lymphocytes Relative: 55 %
Lymphs Abs: 3.7 10*3/uL (ref 0.7–4.0)
MCH: 26.2 pg (ref 26.0–34.0)
MCHC: 32.5 g/dL (ref 30.0–36.0)
MCV: 80.7 fL (ref 80.0–100.0)
Monocytes Absolute: 0.4 10*3/uL (ref 0.1–1.0)
Monocytes Relative: 6 %
Neutro Abs: 2.3 10*3/uL (ref 1.7–7.7)
Neutrophils Relative %: 35 %
Platelets: 363 10*3/uL (ref 150–400)
RBC: 4.5 MIL/uL (ref 3.87–5.11)
RDW: 15.9 % — ABNORMAL HIGH (ref 11.5–15.5)
WBC: 6.7 10*3/uL (ref 4.0–10.5)
nRBC: 0 % (ref 0.0–0.2)

## 2023-01-23 LAB — COMPREHENSIVE METABOLIC PANEL
ALT: 9 U/L (ref 0–44)
AST: 13 U/L — ABNORMAL LOW (ref 15–41)
Albumin: 4 g/dL (ref 3.5–5.0)
Alkaline Phosphatase: 89 U/L (ref 38–126)
Anion gap: 6 (ref 5–15)
BUN: 8 mg/dL (ref 6–20)
CO2: 27 mmol/L (ref 22–32)
Calcium: 8.8 mg/dL — ABNORMAL LOW (ref 8.9–10.3)
Chloride: 105 mmol/L (ref 98–111)
Creatinine, Ser: 0.59 mg/dL (ref 0.44–1.00)
GFR, Estimated: >60 mL/min (ref >60)
Glucose, Bld: 129 mg/dL — ABNORMAL HIGH (ref 70–99)
Potassium: 3.9 mmol/L (ref 3.5–5.1)
Sodium: 138 mmol/L (ref 135–145)
Total Bilirubin: 0.5 mg/dL (ref 0.3–1.2)
Total Protein: 7.3 g/dL (ref 6.5–8.1)

## 2023-01-23 LAB — VITAMIN B12: Vitamin B-12: 2276 pg/mL — ABNORMAL HIGH (ref 180–914)

## 2023-01-23 MED ORDER — OCTREOTIDE ACETATE 30 MG IM KIT
30.0000 mg | PACK | Freq: Once | INTRAMUSCULAR | Status: AC
  Administered 2023-01-23: 30 mg via INTRAMUSCULAR
  Filled 2023-01-23: qty 1

## 2023-01-24 ENCOUNTER — Telehealth: Payer: Self-pay | Admitting: Nurse Practitioner

## 2023-01-24 NOTE — Telephone Encounter (Signed)
Contacted patient to scheduled appointments. Left message with appointment details and a call back number if patient had any questions or could not accommodate the time we provided.   

## 2023-01-28 LAB — CHROMOGRANIN A: Chromogranin A (ng/mL): 178.6 ng/mL — ABNORMAL HIGH (ref 0.0–101.8)

## 2023-02-17 ENCOUNTER — Telehealth: Payer: Self-pay | Admitting: Hematology

## 2023-02-17 ENCOUNTER — Other Ambulatory Visit: Payer: Self-pay

## 2023-02-17 NOTE — Telephone Encounter (Signed)
Patient called to reschedule her 5/9 and June injection, patient leaving the country 5/6 and wont be back until 6/26. Waiting for nurse response and will follow up with patient.

## 2023-02-17 NOTE — Telephone Encounter (Signed)
Per Tammi Sou moved patients Kyiah injection to [facility] and cancelled June injection since patient will be out of the country, patient is going to keep the July injection and just pick up then.

## 2023-02-27 ENCOUNTER — Other Ambulatory Visit: Payer: Self-pay

## 2023-02-27 ENCOUNTER — Inpatient Hospital Stay: Payer: Medicaid Other | Attending: Physician Assistant

## 2023-02-27 VITALS — BP 149/89 | HR 73 | Temp 98.2°F | Resp 18

## 2023-02-27 DIAGNOSIS — C7A012 Malignant carcinoid tumor of the ileum: Secondary | ICD-10-CM | POA: Insufficient documentation

## 2023-02-27 DIAGNOSIS — C7B02 Secondary carcinoid tumors of liver: Secondary | ICD-10-CM | POA: Insufficient documentation

## 2023-02-27 DIAGNOSIS — Z79899 Other long term (current) drug therapy: Secondary | ICD-10-CM | POA: Diagnosis not present

## 2023-02-27 MED ORDER — OCTREOTIDE ACETATE 30 MG IM KIT
30.0000 mg | PACK | Freq: Once | INTRAMUSCULAR | Status: AC
  Administered 2023-02-27: 30 mg via INTRAMUSCULAR
  Filled 2023-02-27: qty 1

## 2023-02-28 ENCOUNTER — Ambulatory Visit: Payer: Self-pay

## 2023-03-19 DIAGNOSIS — H66002 Acute suppurative otitis media without spontaneous rupture of ear drum, left ear: Secondary | ICD-10-CM | POA: Diagnosis not present

## 2023-03-19 DIAGNOSIS — J069 Acute upper respiratory infection, unspecified: Secondary | ICD-10-CM | POA: Diagnosis not present

## 2023-03-19 DIAGNOSIS — H6123 Impacted cerumen, bilateral: Secondary | ICD-10-CM | POA: Diagnosis not present

## 2023-03-19 DIAGNOSIS — R059 Cough, unspecified: Secondary | ICD-10-CM | POA: Diagnosis not present

## 2023-03-19 DIAGNOSIS — Z6824 Body mass index (BMI) 24.0-24.9, adult: Secondary | ICD-10-CM | POA: Diagnosis not present

## 2023-03-21 ENCOUNTER — Other Ambulatory Visit: Payer: Self-pay

## 2023-03-21 ENCOUNTER — Inpatient Hospital Stay: Payer: Medicaid Other | Attending: Physician Assistant

## 2023-03-21 VITALS — BP 137/78 | HR 64 | Temp 98.5°F | Resp 18

## 2023-03-21 DIAGNOSIS — C7A012 Malignant carcinoid tumor of the ileum: Secondary | ICD-10-CM | POA: Insufficient documentation

## 2023-03-21 DIAGNOSIS — C7B02 Secondary carcinoid tumors of liver: Secondary | ICD-10-CM | POA: Insufficient documentation

## 2023-03-21 MED ORDER — OCTREOTIDE ACETATE 30 MG IM KIT
30.0000 mg | PACK | Freq: Once | INTRAMUSCULAR | Status: AC
  Administered 2023-03-21: 30 mg via INTRAMUSCULAR
  Filled 2023-03-21: qty 1

## 2023-03-27 ENCOUNTER — Ambulatory Visit (INDEPENDENT_AMBULATORY_CARE_PROVIDER_SITE_OTHER): Payer: Medicaid Other | Admitting: Student

## 2023-03-27 ENCOUNTER — Encounter: Payer: Self-pay | Admitting: Student

## 2023-03-27 ENCOUNTER — Inpatient Hospital Stay: Payer: Medicaid Other

## 2023-03-27 VITALS — BP 132/82 | HR 74 | Ht 67.0 in | Wt 156.0 lb

## 2023-03-27 DIAGNOSIS — Z1231 Encounter for screening mammogram for malignant neoplasm of breast: Secondary | ICD-10-CM

## 2023-03-27 DIAGNOSIS — R5383 Other fatigue: Secondary | ICD-10-CM

## 2023-03-27 DIAGNOSIS — C7A012 Malignant carcinoid tumor of the ileum: Secondary | ICD-10-CM | POA: Diagnosis not present

## 2023-03-27 DIAGNOSIS — E538 Deficiency of other specified B group vitamins: Secondary | ICD-10-CM

## 2023-03-27 LAB — POCT GLYCOSYLATED HEMOGLOBIN (HGB A1C): Hemoglobin A1C: 5.6 % (ref 4.0–5.6)

## 2023-03-27 NOTE — Patient Instructions (Addendum)
It was great to see you! Thank you for allowing me to participate in your care!   Our plans for today:  -I have referred you to gastroenterology -I am putting in a mammogram for you to obtain -I am getting some labs today and I will let you know what those show -please bring records of pap smear  Take care and seek immediate care sooner if you develop any concerns.  Levin Erp, MD

## 2023-03-27 NOTE — Progress Notes (Signed)
New Patient Office Visit  Subjective    Patient ID: Linda Villanueva, female    DOB: [birth year]  Age: 52 y.o. MRN: [id number]  CC:  Chief Complaint  Patient presents with   Establish Care   Arabic interpreter present during entire encounter  HPI Linda Villanueva presents to establish care PMH- hx of malignant carcinoid tumor small intestine, hx of SBO, hx of h pylori 2021, anemia Medicines: monthly injections (octreotide), B12 vitamin, vitamin D Surgeries- small bowel resection 2023, appendectomy in middle school Allergies-none Social- no alcohol, tobacco use, drug use FH- no heart attack or stroke, no diabetes, no other cancers in family  General fatigue-she would like to check some baseline labs today and discussed this at a future visit. Pap smear-2 years ago was normal-she will send records for [place] Mammogram was told she had 40% chance of getting breast cancer last mammogram 2 years ago?  Will obtain mammogram today Colonoscopy never? 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   cyanocobalamin (VITAMIN B12) 500 MCG tablet Take 500 mcg by mouth daily.   octreotide (SANDOSTATIN LAR) 30 MG injection Inject 30 mg into the muscle every 28 (twenty-eight) days.   [DISCONTINUED] acetaminophen (TYLENOL) 500 MG tablet Take 2 tablets (1,000 mg total) by mouth every 6 (six) hours as needed.   [DISCONTINUED] gabapentin (NEURONTIN) 100 MG capsule Take 1 capsule (100 mg total) by mouth 3 (three) times daily. (Patient not taking: Reported  on 09/20/2022)   [DISCONTINUED] ibuprofen (ADVIL) 200 MG tablet Take 2 tablets (400 mg total) by mouth every 6 (six) hours as needed for mild pain or moderate pain.   [DISCONTINUED] ondansetron (ZOFRAN) 8 MG tablet Take 1 tablet (8 mg total) by mouth every 8 (eight) hours as needed for nausea or vomiting.   [DISCONTINUED] polycarbophil (FIBERCON) 625 MG tablet Take 1 tablet (625 mg total) by mouth 2 (two) times daily. (Patient not taking: Reported on 09/12/2022)   [DISCONTINUED] traMADol (ULTRAM) 50 MG tablet Take 1 tablet (50 mg total) by mouth every 6 (six) hours as needed for severe pain (not relieved by tylenol, ibuprofen, gabapentin). (Patient not taking: Reported on 01/23/2023)   No facility-administered encounter medications on file as of 03/27/2023.    Past Medical History:  Diagnosis Date   Vitamin B12 deficiency    Vitamin D deficiency     Past Surgical History:  Procedure Laterality Date   INNER EAR SURGERY     LAPAROTOMY N/A 05/18/2022   Procedure: EXPLORATORY LAPAROTOMY; SMALL BOWEL RESECTION; WEDGE LIVER BIOPSY;  Surgeon: Linda Soda, MD;  Location: [facility];  Service: General;  Laterality: N/A;    Family History  Problem Relation Age of Onset   Hypertension Mother    CAD Mother    Hypertension Father    Thyroid disease Sister    Colon cancer Other 45 - 23    Social History   Socioeconomic History   Marital status: Married    Spouse name: Not on file   Number of children: Not on file   Years  of education: Not on file   Highest education level: Not on file  Occupational History   Not on file  Tobacco Use   Smoking status: Never   Smokeless tobacco: Never  Vaping Use   Vaping Use: Never used  Substance and Sexual Activity   Alcohol use: No   Drug use: No   Sexual activity: Yes    Partners: Male    Birth control/protection: I.U.D.  Other Topics Concern   Not on file  Social History Narrative   Family originally from [place]   Social Determinants of Health    Financial Resource Strain: High Risk (06/11/2022)   Overall Financial Resource Strain (CARDIA)    Difficulty of Paying Living Expenses: Hard  Food Insecurity: Not on file  Transportation Needs: Not on file  Physical Activity: Not on file  Stress: Not on file  Social Connections: Not on file  Intimate Partner Violence: Not on file   Objective    BP 132/82   Pulse 74   Ht 5\' 7"  (1.702 m)   Wt 156 lb (70.8 kg)   LMP 03/17/2023   SpO2 100%   BMI 24.43 kg/m   General: Well appearing, NAD, awake, alert, responsive to questions Head: Normocephalic atraumatic CV: Regular rate and rhythm no murmurs rubs or gallops Respiratory: Clear to ausculation bilaterally, no wheezes rales or crackles, chest rises symmetrically,  no increased work of breathing Abdomen: Soft, non-tender, non-distended, normoactive bowel sounds  Extremities: Moves upper and lower extremities freely, no edema in LE Neuro: No focal deficits Skin: No rashes or lesions visualized   Assessment & Plan:   Problem List Items Addressed This Visit       Digestive   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  Says she does not follow with the gastroenterologist -GI referral for potential additional screening      Relevant Orders   Ambulatory referral to Gastroenterology     Other   Vitamin B12 deficiency - Primary   Relevant Orders   Vitamin B12   Other Visit Diagnoses     Other fatigue       Relevant Orders   HgB A1c (Completed)   CBC with Differential   TSH Rfx on Abnormal to Free T4   Basic Metabolic Panel   Encounter for screening mammogram for malignant neoplasm of breast       Relevant Orders   MM 3D SCREENING MAMMOGRAM BILATERAL BREAST      Patient does mention that she has generalized fatigue.  Will check some basic labs today and discussed that she should follow-up in 1 week to go over this a little bit more.  She does have significant reasons for  fatigue including having disease to the liver.  Would like for her to discuss this a little bit more on a separate visit.  No follow-ups on file.   Levin Erp, MD

## 2023-03-27 NOTE — Assessment & Plan Note (Signed)
Followed closely by oncology.  Says she does not follow with the gastroenterologist -GI referral for potential additional screening

## 2023-03-28 ENCOUNTER — Encounter: Payer: Self-pay | Admitting: Student

## 2023-03-28 LAB — CBC WITH DIFFERENTIAL/PLATELET
Basophils Absolute: 0.1 10*3/uL (ref 0.0–0.2)
Basos: 1 %
EOS (ABSOLUTE): 0.2 10*3/uL (ref 0.0–0.4)
Eos: 3 %
Hematocrit: 34.9 % (ref 34.0–46.6)
Hemoglobin: 10.8 g/dL — ABNORMAL LOW (ref 11.1–15.9)
Immature Grans (Abs): 0 10*3/uL (ref 0.0–0.1)
Immature Granulocytes: 0 %
Lymphocytes Absolute: 3.3 10*3/uL — ABNORMAL HIGH (ref 0.7–3.1)
Lymphs: 54 %
MCH: 25.8 pg — ABNORMAL LOW (ref 26.6–33.0)
MCHC: 30.9 g/dL — ABNORMAL LOW (ref 31.5–35.7)
MCV: 84 fL (ref 79–97)
Monocytes Absolute: 0.3 10*3/uL (ref 0.1–0.9)
Monocytes: 6 %
Neutrophils Absolute: 2.2 10*3/uL (ref 1.4–7.0)
Neutrophils: 36 %
Platelets: 333 10*3/uL (ref 150–450)
RBC: 4.18 x10E6/uL (ref 3.77–5.28)
RDW: 14.9 % (ref 11.7–15.4)
WBC: 6.1 10*3/uL (ref 3.4–10.8)

## 2023-03-28 LAB — BASIC METABOLIC PANEL
BUN/Creatinine Ratio: 18 (ref 9–23)
BUN: 10 mg/dL (ref 6–24)
CO2: 20 mmol/L (ref 20–29)
Calcium: 9 mg/dL (ref 8.7–10.2)
Chloride: 101 mmol/L (ref 96–106)
Creatinine, Ser: 0.57 mg/dL (ref 0.57–1.00)
Glucose: 132 mg/dL — ABNORMAL HIGH (ref 70–99)
Potassium: 3.9 mmol/L (ref 3.5–5.2)
Sodium: 138 mmol/L (ref 134–144)
eGFR: 111 mL/min/{1.73_m2} (ref >59)

## 2023-03-28 LAB — VITAMIN B12: Vitamin B-12: 793 pg/mL (ref 232–1245)

## 2023-03-28 LAB — TSH RFX ON ABNORMAL TO FREE T4: TSH: 0.506 u[IU]/mL (ref 0.450–4.500)

## 2023-04-01 ENCOUNTER — Ambulatory Visit (INDEPENDENT_AMBULATORY_CARE_PROVIDER_SITE_OTHER): Payer: Medicaid Other | Admitting: Family Medicine

## 2023-04-01 ENCOUNTER — Other Ambulatory Visit: Payer: Self-pay

## 2023-04-01 ENCOUNTER — Encounter: Payer: Self-pay | Admitting: Family Medicine

## 2023-04-01 VITALS — BP 147/82 | HR 73 | Ht 67.0 in | Wt 155.6 lb

## 2023-04-01 DIAGNOSIS — R630 Anorexia: Secondary | ICD-10-CM | POA: Diagnosis not present

## 2023-04-01 DIAGNOSIS — I10 Essential (primary) hypertension: Secondary | ICD-10-CM | POA: Diagnosis not present

## 2023-04-01 MED ORDER — AMLODIPINE BESYLATE 2.5 MG PO TABS: 2.5000 mg | ORAL_TABLET | Freq: Every day | ORAL | 0 refills | Status: DC

## 2023-04-01 NOTE — Progress Notes (Signed)
    SUBJECTIVE:   CHIEF COMPLAINT / HPI:   Discuss Blood Pressure Patient here to discuss her blood pressure.  Reports her oncologist had wanted to start a low-dose of BP medication.  Per chart review, held off on starting due to Ramadan and fasting, but planned to start amlodipine afterwards.  No prior diagnosis of HTN.  Patient checks BP at home once daily.  Usually 130s-140s systolic, 70s-90s diastolic.  Decreased appetite Patient reports decreased appetite, sometimes associated with nausea and reflux sensation in her throat.  Present for several months.  She reports a history of H. pylori, and was positive when she was last tested a few years ago.  She would like repeat H. pylori testing as she wonders if this is contributing to her symptoms. 10-15lb weight loss over past year. Of note, patient with history of carcinoid tumor with liver mets, s/p resection and sandostatin, follows regularly with oncology.  Recently referred to GI as well.   PERTINENT  PMH / PSH: carcinoid tumor of small intestine with probable liver mets  OBJECTIVE:   BP (!) 147/82   Pulse 73   Ht 5\' 7"  (1.702 m)   Wt 155 lb 9.6 oz (70.6 kg)   LMP 03/17/2023   SpO2 100%   BMI 24.37 kg/m   General: NAD, pleasant, able to participate in exam CV: RRR, normal S1/S2 Respiratory: No respiratory distress GI: abd soft, nontender Skin: warm and dry, no rashes noted Psych: Normal affect and mood Neuro: grossly intact  ASSESSMENT/PLAN:   Hypertension BP elevated today x2.  History of elevated BP both at home and prior office visits.  Meets criteria for hypertension.  Will start amlodipine 2.5 mg daily.  Follow-up with PCP in 1 month  Decreased appetite Discussed that symptoms are more likely related to metastatic carcinoid s/p small bowel resection.  However, will check H. pylori breath test today given her history.  Recently referred to GI as well.   Maury Dus, MD [facility]

## 2023-04-01 NOTE — Assessment & Plan Note (Signed)
BP elevated today x2.  History of elevated BP both at home and prior office visits.  Meets criteria for hypertension.  Will start amlodipine 2.5 mg daily.  Follow-up with PCP in 1 month

## 2023-04-01 NOTE — Patient Instructions (Signed)
It was great to meet you!  Things we discussed at today's visit: -We will start a low-dose of blood pressure medication.  Take this once daily.  Continue monitoring your blood pressure at home and let us know if you have any concerns -Follow-up with Dr. Willene Hatchet in 1 month -I will send you a MyChart message with the results of your H. pylori testing  Take care and seek immediate care sooner if you develop any concerns.  Dr. [facility]

## 2023-04-03 DIAGNOSIS — Z23 Encounter for immunization: Secondary | ICD-10-CM | POA: Diagnosis not present

## 2023-04-03 LAB — H. PYLORI BREATH TEST: H pylori Breath Test: NEGATIVE

## 2023-04-24 ENCOUNTER — Inpatient Hospital Stay: Payer: Medicaid Other

## 2023-05-21 ENCOUNTER — Inpatient Hospital Stay: Payer: Medicaid Other

## 2023-05-21 ENCOUNTER — Other Ambulatory Visit: Payer: Self-pay

## 2023-05-21 ENCOUNTER — Encounter: Payer: Self-pay | Admitting: Hematology

## 2023-05-21 ENCOUNTER — Inpatient Hospital Stay: Payer: Medicaid Other | Attending: Physician Assistant | Admitting: Hematology

## 2023-05-21 VITALS — BP 132/83 | HR 83 | Temp 98.2°F | Resp 18 | Ht 67.0 in | Wt 159.4 lb

## 2023-05-21 DIAGNOSIS — R110 Nausea: Secondary | ICD-10-CM | POA: Insufficient documentation

## 2023-05-21 DIAGNOSIS — C7B02 Secondary carcinoid tumors of liver: Secondary | ICD-10-CM | POA: Diagnosis present

## 2023-05-21 DIAGNOSIS — C7A012 Malignant carcinoid tumor of the ileum: Secondary | ICD-10-CM | POA: Insufficient documentation

## 2023-05-21 DIAGNOSIS — Z79899 Other long term (current) drug therapy: Secondary | ICD-10-CM | POA: Diagnosis not present

## 2023-05-21 DIAGNOSIS — I10 Essential (primary) hypertension: Secondary | ICD-10-CM | POA: Insufficient documentation

## 2023-05-21 DIAGNOSIS — N920 Excessive and frequent menstruation with regular cycle: Secondary | ICD-10-CM | POA: Diagnosis not present

## 2023-05-21 DIAGNOSIS — Z9049 Acquired absence of other specified parts of digestive tract: Secondary | ICD-10-CM | POA: Insufficient documentation

## 2023-05-21 DIAGNOSIS — R232 Flushing: Secondary | ICD-10-CM | POA: Insufficient documentation

## 2023-05-21 DIAGNOSIS — D649 Anemia, unspecified: Secondary | ICD-10-CM | POA: Insufficient documentation

## 2023-05-21 DIAGNOSIS — R109 Unspecified abdominal pain: Secondary | ICD-10-CM | POA: Insufficient documentation

## 2023-05-21 DIAGNOSIS — R197 Diarrhea, unspecified: Secondary | ICD-10-CM | POA: Insufficient documentation

## 2023-05-21 DIAGNOSIS — R160 Hepatomegaly, not elsewhere classified: Secondary | ICD-10-CM

## 2023-05-21 LAB — CBC WITH DIFFERENTIAL/PLATELET
Abs Immature Granulocytes: 0.01 10*3/uL (ref 0.00–0.07)
Basophils Absolute: 0.1 10*3/uL (ref 0.0–0.1)
Basophils Relative: 1 %
Eosinophils Absolute: 0.1 10*3/uL (ref 0.0–0.5)
Eosinophils Relative: 2 %
HCT: 32.9 % — ABNORMAL LOW (ref 36.0–46.0)
Hemoglobin: 10.7 g/dL — ABNORMAL LOW (ref 12.0–15.0)
Immature Granulocytes: 0 %
Lymphocytes Relative: 39 %
Lymphs Abs: 2.6 10*3/uL (ref 0.7–4.0)
MCH: 26.8 pg (ref 26.0–34.0)
MCHC: 32.5 g/dL (ref 30.0–36.0)
MCV: 82.3 fL (ref 80.0–100.0)
Monocytes Absolute: 0.5 10*3/uL (ref 0.1–1.0)
Monocytes Relative: 7 %
Neutro Abs: 3.4 10*3/uL (ref 1.7–7.7)
Neutrophils Relative %: 51 %
Platelets: 415 10*3/uL — ABNORMAL HIGH (ref 150–400)
RBC: 4 MIL/uL (ref 3.87–5.11)
RDW: 16.3 % — ABNORMAL HIGH (ref 11.5–15.5)
WBC: 6.6 10*3/uL (ref 4.0–10.5)
nRBC: 0 % (ref 0.0–0.2)

## 2023-05-21 LAB — COMPREHENSIVE METABOLIC PANEL
ALT: 13 U/L (ref 0–44)
AST: 15 U/L (ref 15–41)
Albumin: 3.8 g/dL (ref 3.5–5.0)
Alkaline Phosphatase: 92 U/L (ref 38–126)
Anion gap: 7 (ref 5–15)
BUN: 9 mg/dL (ref 6–20)
CO2: 27 mmol/L (ref 22–32)
Calcium: 8.7 mg/dL — ABNORMAL LOW (ref 8.9–10.3)
Chloride: 105 mmol/L (ref 98–111)
Creatinine, Ser: 0.53 mg/dL (ref 0.44–1.00)
GFR, Estimated: >60 mL/min (ref >60)
Glucose, Bld: 119 mg/dL — ABNORMAL HIGH (ref 70–99)
Potassium: 3.4 mmol/L — ABNORMAL LOW (ref 3.5–5.1)
Sodium: 139 mmol/L (ref 135–145)
Total Bilirubin: 0.4 mg/dL (ref 0.3–1.2)
Total Protein: 6.7 g/dL (ref 6.5–8.1)

## 2023-05-21 LAB — VITAMIN B12: Vitamin B-12: 315 pg/mL (ref 180–914)

## 2023-05-21 MED ORDER — OCTREOTIDE ACETATE 30 MG IM KIT
30.0000 mg | PACK | Freq: Once | INTRAMUSCULAR | Status: AC
  Administered 2023-05-21: 30 mg via INTRAMUSCULAR
  Filled 2023-05-21: qty 1

## 2023-05-21 NOTE — Progress Notes (Unsigned)
[facility]   Telephone:([phone] Fax:([phone]) [phone]   Clinic Follow up Note   Patient Care Team: Linda Erp, MD as PCP - General (Family Medicine) Linda Soda, MD as Consulting Physician (General Surgery) Linda Mood, MD as Consulting Physician (Oncology)  Date of Service:  05/21/2023  CHIEF COMPLAINT: f/u of carcinoid tumor of the small bowel   CURRENT THERAPY:  Sandostatin 20 mg injections q28 days, started 06/14/22, increased to 30 mg in 07/2022.   ASSESSMENT:  Avalin Prisma Villanueva is a 52 y.o. female with     1. 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   -presented with upper abdominal pain with nausea and vomiting. CT 05/16/22 revealed: 2.9 cm mesenteric mass; multiple liver lesions; focal segment of hyperenhancing small bowel; findings suspicious for partial small bowel obstruction.  -S/p emergent resection on 05/18/22 by Dr. Michaell Villanueva. Small bowel mass pathology showed 1 cm well-differentiated NET, grade 1, infiltrating serosa, and an associated 3.2 cm mesenteric mass. Margins negative, 1/15 positive lymph nodes. Liver pathology confirmed metastatic NET. -Baseline chromogranin A on 05/17/22 elevated at 327.9, which has decreased on sandostatin  -24hr urine obtained 06/11/22 showed elevated 5-HIAA. -she began first-line sandostatin injections 7/23, increased to full dose in 07/2022. -Linda Villanueva appears stable. S/p 8 months of sandostatin, tolerating very well with moderate fatigue. NET symptoms have much improved on treatment, and chromogranin A has decreased. The level is pending from today -CT 09/2022 showed partial response -labs reviewed, adequate to proceed with sandostatin today as planned, continue monthly. Will start next injection after monthly cycle in the future -F/up in 4 months with restaging   2. Elevated BP -for past month or so, BP has been 140/90-100's at home. Asymptomatic but worrying  her -She is under stress, drinks caffeine, and has family h/o HTN. She walks 2 miles per day -We discussed medication, she is interested. Ramadan starts soon and she will be fasting for a month, I'd rather not start BP medication when not eating or drinking during the day -She will continue to monitor at home, will start amlodipine if BP still high after Ramadan -In the mean time she will reduce salt, caffeine, and try to get better sleep. Ok to try benadryl or melatonin   3. Abdominal pain, nausea, diarrhea, flushing -She presented to ER with abdominal pain, nausea, diarrhea, and flushing. -Much improved on treatment   4. Social -Lives with her husband, Linda Villanueva, and 10 family members in a house.  The patient states it is a large house and there is space for everyone. 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 Her husband works as a Pensions consultant. -She met with social work  -The patient hopes to visit her mother in [place]. We discussed again today-- I recommend waiting until after receiving 2-3 injections that we would be happy to work with her travel schedule in the future.   5. Anemia -likely secondary to surgery and treatment. 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 Multiple mildly dilated fluid-filled loops of mid small bowel, suspicious for partial small bowel obstruction, poorly defined transition point. Some mesenteric vascular congestion but no intramural air or bowel wall thickening. 2. 2.9 cm enhancing central mesenteric mass. 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 Focal segment of hyperenhancing small bowel could be potential source/site of primary. 3. Small moderate volume free fluid in the pelvis 4. Mildly prominent common bile duct which Linda Villanueva be correlated with LFTs.    05/17/2022 Imaging   CT CHEST WO CONTRAST  IMPRESSION: Indeterminate solid 3 mm right upper lobe pulmonary nodule.   Multiple liver masses best seen on recent contrast enhanced CT of the abdomen and pelvis.   05/17/2022 Tumor Marker   Patient's tumor was tested for the following markers: Chromogranin A. Results of the tumor marker test revealed elevation at 327.9.   05/18/2022 Procedure   POST-OPERATIVE DIAGNOSIS:   JEJUNAL MASS - PARTIALLY OBSTRUCTING LIVER MASSES PROBABLE METASTATIC CARCINOID    PROCEDURE:   EXPLORATORY LAPAROTOMY SMALL BOWEL RESECTION WEDGE LIVER BIOPSY   SURGEON:  Linda Sportsman, MD   OR FINDINGS: 15 mm small bowel nodule at junction of jejunum and ileum causing partial obstruction.  Moderately bulky lymphadenopathy in the mesentery proximal to the nodule.  En bloc resection done.  230 cm of small intestine remaining from ligament of Treitz to ileocecal valve.  Numerous miliary 27mm-60 mm liver nodules.  Wedge resection done of right hepatic lobe just lateral to the gallbladder hepatic fossa.   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       05/18/2022 Pathology Results   SURGICAL PATHOLOGY  CASE: [id number]  PATIENT: Linda Villanueva  Surgical Pathology Report   FINAL MICROSCOPIC DIAGNOSIS:   A. SMALL  BOWEL, MASS, RESECTION:  Well-differentiated neuroendocrine tumor, G1  Tumor measures 1.0 x 0.9 x 0.5 cm  Tumor infiltrates into the serosa (pT3)  Margins free  One of 15 lymph nodes with metastatic tumor and an associated mesenteric  mass measuring 3.2 cm (pN2) (see comment within template)  Perineural invasion present   B. LIVER, WEDGE RESECTION:  Metastatic well differentiated neuroendocrine tumor, 5 foci (ranging  from 7 mm to 0.35 mm)   Pathologic Stage Classification (pTNM, AJCC 8th Edition): pT3, pN2    05/18/2022 Cancer Staging   Staging form: Small Intestine - Other Histologies, AJCC 8th Edition - Pathologic stage from 05/18/2022: pT3, pN2, cM1 - Signed by Linda Mood, MD on 07/11/2022 Histologic grade (G): G1 Histologic grading system: 4 grade system   06/07/2022 Initial Diagnosis   Malignant carcinoid tumor of unknown primary site [facility])      INTERVAL HISTORY:  Linda Villanueva is here for a follow up of carcinoid tumor of the small bowel. She was last seen by NP Lacie on 01/23/2023. She presents to the clinic alone. Pt state that she has no issues. Her BM is a lot better. She states that she goes to the bathroom at least twice a day.   All other systems were reviewed with the patient and are negative.  MEDICAL HISTORY:  Past Medical History:  Diagnosis Date   Vitamin B12 deficiency    Vitamin D deficiency     SURGICAL HISTORY: Past Surgical History:  Procedure Laterality Date   INNER EAR SURGERY     LAPAROTOMY N/A 05/18/2022   Procedure: EXPLORATORY LAPAROTOMY; SMALL BOWEL RESECTION; WEDGE LIVER BIOPSY;  Surgeon: Linda Soda, MD;  Location: [facility];  Service: General;  Laterality: N/A;    I have reviewed the social history and family history with the patient and they are unchanged from previous note.  ALLERGIES:  has No Known Allergies.  MEDICATIONS:  Current Outpatient Medications  Medication Sig Dispense Refill   amLODipine (NORVASC) 2.5 MG tablet Take 1  tablet (2.5 mg total) by mouth at bedtime. 90 tablet 0   cholecalciferol (VITAMIN D3) 25 MCG (1000 UNIT) tablet Take 1,000 Units by mouth daily.     cyanocobalamin (VITAMIN B12) 500 MCG tablet Take 500 mcg by mouth daily.     octreotide (SANDOSTATIN LAR) 30 MG injection Inject 30 mg into the muscle every 28 (twenty-eight) days.     No current facility-administered medications for this visit.    PHYSICAL EXAMINATION: ECOG PERFORMANCE STATUS: {CHL ONC ECOG WJ:[id number]}  Vitals:   05/21/23 1348  BP: 132/83  Pulse: 83  Resp: 18  Temp: 98.2 F (36.8 C)  SpO2: 100%   Wt Readings from Last 3 Encounters:  05/21/23 159 lb 6.4 oz (72.3 kg)  04/01/23 155 lb 9.6 oz (70.6 kg)  03/27/23 156 lb (70.8 kg)    *** GENERAL:alert, no distress and comfortable SKIN: skin color normal, no rashes or significant lesions EYES: normal, Conjunctiva are pink and non-injected, sclera clear  NEURO: alert & oriented x 3 with fluent speech  LABORATORY DATA:  I have reviewed the data as listed    Latest Ref Rng & Units 05/21/2023    1:23 PM 03/27/2023    4:45 PM 01/23/2023   12:04 PM  CBC  WBC 4.0 - 10.5 K/uL 6.6  6.1  6.7   Hemoglobin 12.0 - 15.0 g/dL 21.3  08.6  57.8   Hematocrit 36.0 - 46.0 % 32.9  34.9  36.3   Platelets 150 - 400 K/uL 415  333  363         Latest Ref Rng & Units 03/27/2023    4:45 PM 01/23/2023   12:04 PM 09/05/2022    1:50 PM  CMP  Glucose 70 - 99 mg/dL 469  629  528   BUN 6 - 24 mg/dL 10  8  10    Creatinine 0.57 - 1.00 mg/dL 4.13  2.44  0.10   Sodium 134 - 144 mmol/L 138  138  140   Potassium 3.5 - 5.2 mmol/L 3.9  3.9  3.6   Chloride 96 - 106 mmol/L 101  105  107   CO2 20 - 29 mmol/L 20  27  27    Calcium 8.7 - 10.2 mg/dL 9.0  8.8  8.5   Total Protein 6.5 - 8.1 g/dL  7.3  6.8   Total Bilirubin 0.3 - 1.2 mg/dL  0.5  0.4   Alkaline Phos 38 - 126 U/L  89  73   AST 15 - 41 U/L  13  11   ALT 0 - 44 U/L  9  8       RADIOGRAPHIC STUDIES: I have personally reviewed the  radiological images as listed and agreed with the findings in the report. No results found.    No orders of  the defined types were placed in this encounter.  All questions were answered. The patient knows to call the clinic with any problems, questions or concerns. No barriers to learning was detected. The total time spent in the appointment was {CHL ONC TIME VISIT - [id number]}.     Salome Holmes, CMA 05/21/2023   I, Monica Martinez, CMA, am acting as scribe for Linda Mood, MD.   {Add scribe attestation statement}

## 2023-05-22 ENCOUNTER — Encounter: Payer: Self-pay | Admitting: Hematology

## 2023-05-22 LAB — CHROMOGRANIN A: Chromogranin A (ng/mL): 202.7 ng/mL — ABNORMAL HIGH (ref 0.0–101.8)

## 2023-06-18 ENCOUNTER — Other Ambulatory Visit: Payer: Self-pay

## 2023-06-18 DIAGNOSIS — C7A012 Malignant carcinoid tumor of the ileum: Secondary | ICD-10-CM

## 2023-06-19 ENCOUNTER — Inpatient Hospital Stay: Payer: Medicaid Other | Attending: Physician Assistant

## 2023-06-19 ENCOUNTER — Other Ambulatory Visit: Payer: Self-pay

## 2023-06-19 ENCOUNTER — Inpatient Hospital Stay: Payer: Medicaid Other

## 2023-06-19 VITALS — BP 118/71 | HR 73 | Temp 98.1°F | Resp 18

## 2023-06-19 DIAGNOSIS — C7B02 Secondary carcinoid tumors of liver: Secondary | ICD-10-CM | POA: Insufficient documentation

## 2023-06-19 DIAGNOSIS — C7A012 Malignant carcinoid tumor of the ileum: Secondary | ICD-10-CM

## 2023-06-19 DIAGNOSIS — Z79899 Other long term (current) drug therapy: Secondary | ICD-10-CM | POA: Insufficient documentation

## 2023-06-19 DIAGNOSIS — D649 Anemia, unspecified: Secondary | ICD-10-CM

## 2023-06-19 LAB — CBC WITH DIFFERENTIAL (CANCER CENTER ONLY)
Abs Immature Granulocytes: 0.01 10*3/uL (ref 0.00–0.07)
Basophils Absolute: 0.1 10*3/uL (ref 0.0–0.1)
Basophils Relative: 1 %
Eosinophils Absolute: 0.1 10*3/uL (ref 0.0–0.5)
Eosinophils Relative: 2 %
HCT: 33.7 % — ABNORMAL LOW (ref 36.0–46.0)
Hemoglobin: 11.1 g/dL — ABNORMAL LOW (ref 12.0–15.0)
Immature Granulocytes: 0 %
Lymphocytes Relative: 46 %
Lymphs Abs: 2.7 10*3/uL (ref 0.7–4.0)
MCH: 26.7 pg (ref 26.0–34.0)
MCHC: 32.9 g/dL (ref 30.0–36.0)
MCV: 81.2 fL (ref 80.0–100.0)
Monocytes Absolute: 0.4 10*3/uL (ref 0.1–1.0)
Monocytes Relative: 7 %
Neutro Abs: 2.6 10*3/uL (ref 1.7–7.7)
Neutrophils Relative %: 44 %
Platelet Count: 327 10*3/uL (ref 150–400)
RBC: 4.15 MIL/uL (ref 3.87–5.11)
RDW: 16 % — ABNORMAL HIGH (ref 11.5–15.5)
WBC Count: 5.8 10*3/uL (ref 4.0–10.5)
nRBC: 0 % (ref 0.0–0.2)

## 2023-06-19 LAB — CMP (CANCER CENTER ONLY)
ALT: 9 U/L (ref 0–44)
AST: 13 U/L — ABNORMAL LOW (ref 15–41)
Albumin: 4 g/dL (ref 3.5–5.0)
Alkaline Phosphatase: 76 U/L (ref 38–126)
Anion gap: 7 (ref 5–15)
BUN: 8 mg/dL (ref 6–20)
CO2: 25 mmol/L (ref 22–32)
Calcium: 8.6 mg/dL — ABNORMAL LOW (ref 8.9–10.3)
Chloride: 107 mmol/L (ref 98–111)
Creatinine: 0.6 mg/dL (ref 0.44–1.00)
GFR, Estimated: >60 mL/min (ref >60)
Glucose, Bld: 122 mg/dL — ABNORMAL HIGH (ref 70–99)
Potassium: 3.4 mmol/L — ABNORMAL LOW (ref 3.5–5.1)
Sodium: 139 mmol/L (ref 135–145)
Total Bilirubin: 0.5 mg/dL (ref 0.3–1.2)
Total Protein: 7.1 g/dL (ref 6.5–8.1)

## 2023-06-19 LAB — IRON AND IRON BINDING CAPACITY (CC-WL,HP ONLY)
Iron: 69 ug/dL (ref 28–170)
Saturation Ratios: 14 % (ref 10.4–31.8)
TIBC: 480 ug/dL — ABNORMAL HIGH (ref 250–450)
UIBC: 411 ug/dL (ref 148–442)

## 2023-06-19 LAB — FERRITIN: Ferritin: 5 ng/mL — ABNORMAL LOW (ref 11–307)

## 2023-06-19 MED ORDER — OCTREOTIDE ACETATE 30 MG IM KIT
30.0000 mg | PACK | Freq: Once | INTRAMUSCULAR | Status: AC
  Administered 2023-06-19: 30 mg via INTRAMUSCULAR
  Filled 2023-06-19: qty 1

## 2023-06-27 DIAGNOSIS — C7A012 Malignant carcinoid tumor of the ileum: Secondary | ICD-10-CM | POA: Diagnosis not present

## 2023-06-27 DIAGNOSIS — Z124 Encounter for screening for malignant neoplasm of cervix: Secondary | ICD-10-CM | POA: Diagnosis not present

## 2023-06-27 DIAGNOSIS — Z1231 Encounter for screening mammogram for malignant neoplasm of breast: Secondary | ICD-10-CM | POA: Diagnosis not present

## 2023-06-27 DIAGNOSIS — Z0001 Encounter for general adult medical examination with abnormal findings: Secondary | ICD-10-CM | POA: Diagnosis not present

## 2023-06-30 ENCOUNTER — Other Ambulatory Visit: Payer: Self-pay

## 2023-06-30 ENCOUNTER — Telehealth: Payer: Self-pay

## 2023-06-30 DIAGNOSIS — H5213 Myopia, bilateral: Secondary | ICD-10-CM | POA: Diagnosis not present

## 2023-06-30 DIAGNOSIS — C7A012 Malignant carcinoid tumor of the ileum: Secondary | ICD-10-CM

## 2023-06-30 NOTE — Telephone Encounter (Addendum)
Called patient and relayed message below as per Dr.Feng. patient voiced full understanding. Also added standing order for Ferritin every 3 months.   ----- Message from Malachy Mood sent at 06/29/2023 10:58 PM EDT ----- Please call pt and let her know her iron level is low, I recommend OTC ferric sulfate 1 tab daily, and please add standing lab order of ferritin every 3 months, thx   Malachy Mood

## 2023-07-01 MED ORDER — AMLODIPINE BESYLATE 2.5 MG PO TABS: 2.5000 mg | ORAL_TABLET | Freq: Every day | ORAL | 3 refills | Status: DC

## 2023-07-04 DIAGNOSIS — C7A012 Malignant carcinoid tumor of the ileum: Secondary | ICD-10-CM | POA: Diagnosis not present

## 2023-07-09 LAB — 5 HIAA, QUANTITATIVE, URINE, 24 HOUR
5-HIAA, Ur: 20.7 mg/L
5-HIAA,Quant.,24 Hr Urine: 29.5 mg/24 hr — ABNORMAL HIGH (ref 0.0–14.9)
Total Volume: 1425

## 2023-07-17 ENCOUNTER — Inpatient Hospital Stay: Payer: Medicaid Other

## 2023-07-17 ENCOUNTER — Other Ambulatory Visit: Payer: Self-pay

## 2023-07-17 VITALS — BP 133/87 | HR 65 | Temp 97.8°F | Resp 18

## 2023-07-17 DIAGNOSIS — C7A012 Malignant carcinoid tumor of the ileum: Secondary | ICD-10-CM | POA: Diagnosis not present

## 2023-07-17 LAB — CBC WITH DIFFERENTIAL (CANCER CENTER ONLY)
Abs Immature Granulocytes: 0.01 10*3/uL (ref 0.00–0.07)
Basophils Absolute: 0.1 10*3/uL (ref 0.0–0.1)
Basophils Relative: 1 %
Eosinophils Absolute: 0.1 10*3/uL (ref 0.0–0.5)
Eosinophils Relative: 2 %
HCT: 32.9 % — ABNORMAL LOW (ref 36.0–46.0)
Hemoglobin: 11 g/dL — ABNORMAL LOW (ref 12.0–15.0)
Immature Granulocytes: 0 %
Lymphocytes Relative: 45 %
Lymphs Abs: 3.3 10*3/uL (ref 0.7–4.0)
MCH: 27 pg (ref 26.0–34.0)
MCHC: 33.4 g/dL (ref 30.0–36.0)
MCV: 80.6 fL (ref 80.0–100.0)
Monocytes Absolute: 0.5 10*3/uL (ref 0.1–1.0)
Monocytes Relative: 7 %
Neutro Abs: 3.2 10*3/uL (ref 1.7–7.7)
Neutrophils Relative %: 45 %
Platelet Count: 336 10*3/uL (ref 150–400)
RBC: 4.08 MIL/uL (ref 3.87–5.11)
RDW: 15.7 % — ABNORMAL HIGH (ref 11.5–15.5)
WBC Count: 7.2 10*3/uL (ref 4.0–10.5)
nRBC: 0 % (ref 0.0–0.2)

## 2023-07-17 LAB — CMP (CANCER CENTER ONLY)
ALT: 10 U/L (ref 0–44)
AST: 14 U/L — ABNORMAL LOW (ref 15–41)
Albumin: 4.2 g/dL (ref 3.5–5.0)
Alkaline Phosphatase: 77 U/L (ref 38–126)
Anion gap: 7 (ref 5–15)
BUN: 11 mg/dL (ref 6–20)
CO2: 26 mmol/L (ref 22–32)
Calcium: 8.8 mg/dL — ABNORMAL LOW (ref 8.9–10.3)
Chloride: 105 mmol/L (ref 98–111)
Creatinine: 0.55 mg/dL (ref 0.44–1.00)
GFR, Estimated: >60 mL/min (ref >60)
Glucose, Bld: 110 mg/dL — ABNORMAL HIGH (ref 70–99)
Potassium: 3.9 mmol/L (ref 3.5–5.1)
Sodium: 138 mmol/L (ref 135–145)
Total Bilirubin: 0.4 mg/dL (ref 0.3–1.2)
Total Protein: 7.6 g/dL (ref 6.5–8.1)

## 2023-07-17 LAB — FERRITIN: Ferritin: 6 ng/mL — ABNORMAL LOW (ref 11–307)

## 2023-07-17 MED ORDER — OCTREOTIDE ACETATE 30 MG IM KIT
30.0000 mg | PACK | Freq: Once | INTRAMUSCULAR | Status: AC
  Administered 2023-07-17: 30 mg via INTRAMUSCULAR
  Filled 2023-07-17: qty 1

## 2023-07-29 ENCOUNTER — Other Ambulatory Visit: Payer: Self-pay

## 2023-07-29 ENCOUNTER — Other Ambulatory Visit: Payer: Self-pay | Admitting: Hematology

## 2023-07-29 DIAGNOSIS — R160 Hepatomegaly, not elsewhere classified: Secondary | ICD-10-CM

## 2023-07-29 DIAGNOSIS — C7A012 Malignant carcinoid tumor of the ileum: Secondary | ICD-10-CM

## 2023-08-14 ENCOUNTER — Inpatient Hospital Stay: Payer: Medicaid Other

## 2023-08-14 ENCOUNTER — Inpatient Hospital Stay: Payer: Medicaid Other | Attending: Physician Assistant

## 2023-08-14 ENCOUNTER — Ambulatory Visit (HOSPITAL_COMMUNITY): Payer: Medicaid Other

## 2023-08-14 ENCOUNTER — Ambulatory Visit (HOSPITAL_COMMUNITY)
Admission: RE | Admit: 2023-08-14 | Discharge: 2023-08-14 | Disposition: A | Discharge status: Home / Self Care | Payer: Medicaid Other | Source: Ambulatory Visit | Attending: Hematology | Admitting: Hematology

## 2023-08-14 VITALS — BP 123/76 | HR 67 | Temp 98.1°F | Resp 17

## 2023-08-14 DIAGNOSIS — C7B02 Secondary carcinoid tumors of liver: Secondary | ICD-10-CM | POA: Insufficient documentation

## 2023-08-14 DIAGNOSIS — R160 Hepatomegaly, not elsewhere classified: Secondary | ICD-10-CM | POA: Insufficient documentation

## 2023-08-14 DIAGNOSIS — C7A012 Malignant carcinoid tumor of the ileum: Secondary | ICD-10-CM

## 2023-08-14 DIAGNOSIS — Z79899 Other long term (current) drug therapy: Secondary | ICD-10-CM | POA: Insufficient documentation

## 2023-08-14 DIAGNOSIS — C787 Secondary malignant neoplasm of liver and intrahepatic bile duct: Secondary | ICD-10-CM | POA: Diagnosis not present

## 2023-08-14 DIAGNOSIS — E041 Nontoxic single thyroid nodule: Secondary | ICD-10-CM | POA: Diagnosis not present

## 2023-08-14 DIAGNOSIS — C7A8 Other malignant neuroendocrine tumors: Secondary | ICD-10-CM | POA: Diagnosis not present

## 2023-08-14 LAB — CBC WITH DIFFERENTIAL (CANCER CENTER ONLY)
Abs Immature Granulocytes: 0.01 10*3/uL (ref 0.00–0.07)
Basophils Absolute: 0.1 10*3/uL (ref 0.0–0.1)
Basophils Relative: 1 %
Eosinophils Absolute: 0.1 10*3/uL (ref 0.0–0.5)
Eosinophils Relative: 2 %
HCT: 33.6 % — ABNORMAL LOW (ref 36.0–46.0)
Hemoglobin: 10.7 g/dL — ABNORMAL LOW (ref 12.0–15.0)
Immature Granulocytes: 0 %
Lymphocytes Relative: 40 %
Lymphs Abs: 2.4 10*3/uL (ref 0.7–4.0)
MCH: 25.8 pg — ABNORMAL LOW (ref 26.0–34.0)
MCHC: 31.8 g/dL (ref 30.0–36.0)
MCV: 81.2 fL (ref 80.0–100.0)
Monocytes Absolute: 0.5 10*3/uL (ref 0.1–1.0)
Monocytes Relative: 8 %
Neutro Abs: 3 10*3/uL (ref 1.7–7.7)
Neutrophils Relative %: 49 %
Platelet Count: 303 10*3/uL (ref 150–400)
RBC: 4.14 MIL/uL (ref 3.87–5.11)
RDW: 16.1 % — ABNORMAL HIGH (ref 11.5–15.5)
WBC Count: 6 10*3/uL (ref 4.0–10.5)
nRBC: 0 % (ref 0.0–0.2)

## 2023-08-14 LAB — CMP (CANCER CENTER ONLY)
ALT: 9 U/L (ref 0–44)
AST: 12 U/L — ABNORMAL LOW (ref 15–41)
Albumin: 3.9 g/dL (ref 3.5–5.0)
Alkaline Phosphatase: 77 U/L (ref 38–126)
Anion gap: 6 (ref 5–15)
BUN: 11 mg/dL (ref 6–20)
CO2: 26 mmol/L (ref 22–32)
Calcium: 8.9 mg/dL (ref 8.9–10.3)
Chloride: 108 mmol/L (ref 98–111)
Creatinine: 0.5 mg/dL (ref 0.44–1.00)
GFR, Estimated: >60 mL/min (ref >60)
Glucose, Bld: 108 mg/dL — ABNORMAL HIGH (ref 70–99)
Potassium: 4.2 mmol/L (ref 3.5–5.1)
Sodium: 140 mmol/L (ref 135–145)
Total Bilirubin: 0.6 mg/dL (ref 0.3–1.2)
Total Protein: 7.2 g/dL (ref 6.5–8.1)

## 2023-08-14 MED ORDER — OCTREOTIDE ACETATE 30 MG IM KIT
30.0000 mg | PACK | Freq: Once | INTRAMUSCULAR | Status: AC
  Administered 2023-08-14: 30 mg via INTRAMUSCULAR
  Filled 2023-08-14: qty 1

## 2023-08-14 MED ORDER — IOHEXOL 300 MG/ML  SOLN
100.0000 mL | Freq: Once | INTRAMUSCULAR | Status: AC | PRN
  Administered 2023-08-14: 100 mL via INTRAVENOUS

## 2023-09-11 ENCOUNTER — Inpatient Hospital Stay: Payer: Medicaid Other

## 2023-09-11 ENCOUNTER — Encounter: Payer: Self-pay | Admitting: Hematology

## 2023-09-11 ENCOUNTER — Inpatient Hospital Stay: Payer: Medicaid Other | Attending: Physician Assistant | Admitting: Hematology

## 2023-09-11 VITALS — BP 116/78 | HR 76 | Temp 98.0°F | Resp 16 | Ht 67.0 in | Wt 161.5 lb

## 2023-09-11 DIAGNOSIS — R5383 Other fatigue: Secondary | ICD-10-CM | POA: Insufficient documentation

## 2023-09-11 DIAGNOSIS — C7A012 Malignant carcinoid tumor of the ileum: Secondary | ICD-10-CM

## 2023-09-11 DIAGNOSIS — D509 Iron deficiency anemia, unspecified: Secondary | ICD-10-CM | POA: Diagnosis not present

## 2023-09-11 DIAGNOSIS — R197 Diarrhea, unspecified: Secondary | ICD-10-CM | POA: Diagnosis not present

## 2023-09-11 DIAGNOSIS — Z79899 Other long term (current) drug therapy: Secondary | ICD-10-CM | POA: Insufficient documentation

## 2023-09-11 DIAGNOSIS — R112 Nausea with vomiting, unspecified: Secondary | ICD-10-CM | POA: Insufficient documentation

## 2023-09-11 DIAGNOSIS — I10 Essential (primary) hypertension: Secondary | ICD-10-CM | POA: Insufficient documentation

## 2023-09-11 DIAGNOSIS — C7B02 Secondary carcinoid tumors of liver: Secondary | ICD-10-CM | POA: Diagnosis present

## 2023-09-11 DIAGNOSIS — R531 Weakness: Secondary | ICD-10-CM | POA: Diagnosis not present

## 2023-09-11 DIAGNOSIS — Z9049 Acquired absence of other specified parts of digestive tract: Secondary | ICD-10-CM | POA: Diagnosis not present

## 2023-09-11 DIAGNOSIS — E538 Deficiency of other specified B group vitamins: Secondary | ICD-10-CM | POA: Insufficient documentation

## 2023-09-11 LAB — CMP (CANCER CENTER ONLY)
ALT: 13 U/L (ref 0–44)
AST: 15 U/L (ref 15–41)
Albumin: 3.6 g/dL (ref 3.5–5.0)
Alkaline Phosphatase: 69 U/L (ref 38–126)
Anion gap: 9 (ref 5–15)
BUN: 11 mg/dL (ref 6–20)
CO2: 23 mmol/L (ref 22–32)
Calcium: 8.8 mg/dL — ABNORMAL LOW (ref 8.9–10.3)
Chloride: 106 mmol/L (ref 98–111)
Creatinine: 0.52 mg/dL (ref 0.44–1.00)
GFR, Estimated: >60 mL/min (ref >60)
Glucose, Bld: 134 mg/dL — ABNORMAL HIGH (ref 70–99)
Potassium: 4.4 mmol/L (ref 3.5–5.1)
Sodium: 138 mmol/L (ref 135–145)
Total Bilirubin: 0.6 mg/dL (ref 0.3–1.2)
Total Protein: 7.2 g/dL (ref 6.5–8.1)

## 2023-09-11 LAB — CBC WITH DIFFERENTIAL (CANCER CENTER ONLY)
Abs Immature Granulocytes: 0.02 10*3/uL (ref 0.00–0.07)
Basophils Absolute: 0.1 10*3/uL (ref 0.0–0.1)
Basophils Relative: 1 %
Eosinophils Absolute: 0.1 10*3/uL (ref 0.0–0.5)
Eosinophils Relative: 2 %
HCT: 34 % — ABNORMAL LOW (ref 36.0–46.0)
Hemoglobin: 10.9 g/dL — ABNORMAL LOW (ref 12.0–15.0)
Immature Granulocytes: 0 %
Lymphocytes Relative: 43 %
Lymphs Abs: 3 10*3/uL (ref 0.7–4.0)
MCH: 26 pg (ref 26.0–34.0)
MCHC: 32.1 g/dL (ref 30.0–36.0)
MCV: 81.1 fL (ref 80.0–100.0)
Monocytes Absolute: 0.5 10*3/uL (ref 0.1–1.0)
Monocytes Relative: 7 %
Neutro Abs: 3.3 10*3/uL (ref 1.7–7.7)
Neutrophils Relative %: 47 %
Platelet Count: 317 10*3/uL (ref 150–400)
RBC: 4.19 MIL/uL (ref 3.87–5.11)
RDW: 16.6 % — ABNORMAL HIGH (ref 11.5–15.5)
WBC Count: 7 10*3/uL (ref 4.0–10.5)
nRBC: 0 % (ref 0.0–0.2)

## 2023-09-11 MED ORDER — OCTREOTIDE ACETATE 30 MG IM KIT
30.0000 mg | PACK | Freq: Once | INTRAMUSCULAR | Status: AC
  Administered 2023-09-11: 30 mg via INTRAMUSCULAR
  Filled 2023-09-11: qty 1

## 2023-09-11 NOTE — Assessment & Plan Note (Addendum)
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   -presented with upper abdominal pain with nausea and vomiting. CT 05/16/22 revealed: 2.9 cm mesenteric mass; multiple liver lesions; focal segment of hyperenhancing small bowel; findings suspicious for partial small bowel obstruction.  -S/p emergent resection on 05/18/22 by Dr. Michaell Cowing. Small bowel mass pathology showed 1 cm well-differentiated NET, grade 1, infiltrating serosa, and an associated 3.2 cm mesenteric mass. Margins negative, 1/15 positive lymph nodes. Liver pathology confirmed metastatic NET. -Baseline chromogranin A on 05/17/22 elevated at 327.9, which has decreased on sandostatin  -24hr urine obtained 06/11/22 showed elevated 5-HIAA. -she began first-line sandostatin injections 7/23, increased to full dose in 07/2022. -CT scan in September 2023 showed mild disease progression, she is clinically doing well.

## 2023-09-11 NOTE — Progress Notes (Signed)
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   -presented with upper abdominal pain with nausea and vomiting. CT 05/16/22 revealed: 2.9 cm mesenteric mass; multiple liver lesions; focal segment of hyperenhancing small bowel; findings suspicious for partial small bowel obstruction.  -S/p emergent resection on 05/18/22 by Dr. Michaell Cowing. Small bowel mass pathology showed 1 cm well-differentiated NET, grade 1, infiltrating serosa, and an associated 3.2 cm mesenteric mass. Margins negative, 1/15 positive lymph nodes. Liver pathology confirmed metastatic NET. -Baseline chromogranin A on 05/17/22 elevated at 327.9, which has decreased on sandostatin  -24hr urine obtained 06/11/22 showed elevated 5-HIAA. -she began first-line sandostatin injections 7/23, increased to full dose in 07/2022. -CT scan in September 2023 showed mild disease progression, she is clinically doing well.     Assessment and Plan    Metastatic Neuroendocrine Tumor Stable disease with slight decrease in liver lesions on recent CT scan. No new symptoms related to the tumor. -Continue monthly injections for the next three months. -Repeat scan in six months.  Iron Deficiency Anemia Patient reports fatigue and weakness, possibly related to low iron levels. Iron levels were low in August. Patient has not been taking oral iron supplements. -Start oral iron supplementation twice daily for 2-4 weeks, then  decrease to once daily. -Check iron levels at next monthly visit. -Consider IV iron infusion if oral supplementation is not effective.  Gastrointestinal Symptoms Patient reports gas and loose stools, possibly related to diet and neuroendocrine tumor. -Try over-the-counter Gas-X for gas. -Continue current diet, ensuring adequate protein intake.  Hypertension Controlled with nightly medication. -Continue current regimen.  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 Multiple mildly dilated fluid-filled loops of mid small bowel, suspicious for partial small bowel obstruction, poorly defined transition point. Some mesenteric vascular congestion but no intramural air or bowel wall thickening. 2. 2.9 cm enhancing central mesenteric mass. 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 Focal segment of hyperenhancing small bowel could be potential source/site of primary. 3. Small moderate volume free fluid in the pelvis 4. Mildly prominent common bile duct which Dinna be correlated with LFTs.    05/17/2022 Imaging   CT CHEST WO CONTRAST  IMPRESSION: Indeterminate solid 3 mm right upper lobe pulmonary nodule.   Multiple liver masses  best seen on  recent contrast enhanced CT of the abdomen and pelvis.   05/17/2022 Tumor Marker   Patient's tumor was tested for the following markers: Chromogranin A. Results of the tumor marker test revealed elevation at 327.9.   05/18/2022 Procedure   POST-OPERATIVE DIAGNOSIS:   JEJUNAL MASS - PARTIALLY OBSTRUCTING LIVER MASSES PROBABLE METASTATIC CARCINOID    PROCEDURE:   EXPLORATORY LAPAROTOMY SMALL BOWEL RESECTION WEDGE LIVER BIOPSY   SURGEON:  Ardeth Sportsman, MD   OR FINDINGS: 15 mm small bowel nodule at junction of jejunum and ileum causing partial obstruction.  Moderately bulky lymphadenopathy in the mesentery proximal to the nodule.  En bloc resection done.  230 cm of small intestine remaining from ligament of Treitz to ileocecal valve.  Numerous miliary 105mm-60 mm liver nodules.  Wedge resection done of right hepatic lobe just lateral to the gallbladder hepatic fossa.   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       05/18/2022 Pathology Results   SURGICAL PATHOLOGY  CASE: [id number]  PATIENT: Linda Villanueva  Surgical Pathology Report   FINAL MICROSCOPIC DIAGNOSIS:   A. SMALL BOWEL, MASS, RESECTION:  Well-differentiated neuroendocrine tumor, G1  Tumor measures 1.0 x 0.9 x 0.5 cm  Tumor infiltrates into the serosa (pT3)  Margins free  One of 15 lymph nodes with metastatic tumor and an associated mesenteric  mass measuring 3.2 cm (pN2) (see comment within template)  Perineural invasion present   B. LIVER, WEDGE RESECTION:  Metastatic well differentiated neuroendocrine tumor, 5 foci (ranging  from 7 mm to 0.35 mm)   Pathologic Stage Classification (pTNM, AJCC 8th Edition): pT3, pN2    05/18/2022 Cancer Staging   Staging form: Small Intestine - Other Histologies, AJCC 8th Edition - Pathologic stage from 05/18/2022: pT3, pN2, cM1 - Signed by Malachy Mood, MD on 07/11/2022 Histologic grade (G): G1 Histologic grading system: 4 grade system   06/07/2022  Initial Diagnosis   Malignant carcinoid tumor of unknown primary site [facility])      Discussed the use of AI scribe software for clinical note transcription with the patient, who gave verbal consent to proceed.  History of Present Illness   A 52 year old patient with a history of metastatic neuroendocrine tumor presents for a follow-up visit. The patient reports feeling better overall, with a recent CT scan showing a slight decrease in the size of liver spots. However, the patient continues to experience gas and fatigue. The patient reports having two bowel movements a day, which are sometimes loose and have a bad smell. The patient also reports feeling tired and weak, especially after physical activity such as walking or cooking. The patient has not lost weight and has actually gained a few pounds. The patient also reports a low iron level, which has not been supplemented. The patient has a history of hypertension, which is managed with nightly medication.         All other systems were reviewed with the patient and are negative.  MEDICAL HISTORY:  Past Medical History:  Diagnosis Date   Vitamin B12 deficiency    Vitamin D deficiency     SURGICAL HISTORY: Past Surgical History:  Procedure Laterality Date   INNER EAR SURGERY     LAPAROTOMY N/A 05/18/2022   Procedure: EXPLORATORY LAPAROTOMY; SMALL BOWEL RESECTION; WEDGE LIVER BIOPSY;  Surgeon: Karie Soda, MD;  Location: [facility];  Service: General;  Laterality: N/A;    I have reviewed the social history and family history with the  patient and they are unchanged from previous note.  ALLERGIES:  has No Known Allergies.  MEDICATIONS:  Current Outpatient Medications  Medication Sig Dispense Refill   amLODipine (NORVASC) 2.5 MG tablet Take 1 tablet (2.5 mg total) by mouth at bedtime. 90 tablet 3   cholecalciferol (VITAMIN D3) 25 MCG (1000 UNIT) tablet Take 1,000 Units by mouth daily.     cyanocobalamin (VITAMIN B12) 500 MCG tablet Take  500 mcg by mouth daily.     octreotide (SANDOSTATIN LAR) 30 MG injection Inject 30 mg into the muscle every 28 (twenty-eight) days.     No current facility-administered medications for this visit.    PHYSICAL EXAMINATION: ECOG PERFORMANCE STATUS: 1 - Symptomatic but completely ambulatory  Vitals:   09/11/23 1328  BP: 116/78  Pulse: 76  Resp: 16  Temp: 98 F (36.7 C)  SpO2: 100%   Wt Readings from Last 3 Encounters:  09/11/23 161 lb 8 oz (73.3 kg)  05/21/23 159 lb 6.4 oz (72.3 kg)  04/01/23 155 lb 9.6 oz (70.6 kg)     GENERAL:alert, no distress and comfortable SKIN: skin color, texture, turgor are normal, no rashes or significant lesions EYES: normal, Conjunctiva are pink and non-injected, sclera clear NECK: supple, thyroid normal size, non-tender, without nodularity LYMPH:  no palpable lymphadenopathy in the cervical, axillary  LUNGS: clear to auscultation and percussion with normal breathing effort HEART: regular rate & rhythm and no murmurs and no lower extremity edema ABDOMEN:abdomen soft, non-tender and normal bowel sounds Musculoskeletal:no cyanosis of digits and no clubbing  NEURO: alert & oriented x 3 with fluent speech, no focal motor/sensory deficits  LABORATORY DATA:  I have reviewed the data as listed    Latest Ref Rng & Units 09/11/2023    1:13 PM 08/14/2023    1:24 PM 07/17/2023    1:05 PM  CBC  WBC 4.0 - 10.5 K/uL 7.0  6.0  7.2   Hemoglobin 12.0 - 15.0 g/dL 81.1  91.4  78.2   Hematocrit 36.0 - 46.0 % 34.0  33.6  32.9   Platelets 150 - 400 K/uL 317  303  336         Latest Ref Rng & Units 09/11/2023    1:13 PM 08/14/2023    1:24 PM 07/17/2023    1:05 PM  CMP  Glucose 70 - 99 mg/dL 956  213  086   BUN 6 - 20 mg/dL 11  11  11    Creatinine 0.44 - 1.00 mg/dL 5.78  4.69  6.29   Sodium 135 - 145 mmol/L 138  140  138   Potassium 3.5 - 5.1 mmol/L 4.4  4.2  3.9   Chloride 98 - 111 mmol/L 106  108  105   CO2 22 - 32 mmol/L 23  26  26    Calcium 8.9 - 10.3  mg/dL 8.8  8.9  8.8   Total Protein 6.5 - 8.1 g/dL 7.2  7.2  7.6   Total Bilirubin 0.3 - 1.2 mg/dL 0.6  0.6  0.4   Alkaline Phos 38 - 126 U/L 69  77  77   AST 15 - 41 U/L 15  12  14    ALT 0 - 44 U/L 13  9  10        RADIOGRAPHIC STUDIES: I have personally reviewed the radiological images as listed and agreed with the findings in the report. No results found.    No orders of the defined types were placed in this encounter.  All questions were answered. The patient knows to call the clinic with any problems, questions or concerns. No barriers to learning was detected. The total time spent in the appointment was 30 minutes.     Malachy Mood, MD 09/11/2023

## 2023-09-12 ENCOUNTER — Telehealth: Payer: Self-pay | Admitting: Hematology

## 2023-09-12 ENCOUNTER — Inpatient Hospital Stay: Payer: Medicaid Other | Admitting: Licensed Clinical Social Worker

## 2023-09-12 DIAGNOSIS — C7A012 Malignant carcinoid tumor of the ileum: Secondary | ICD-10-CM

## 2023-09-12 NOTE — Progress Notes (Signed)
[facility] CSW Progress Note  Visual merchandiser  spoke with pt by phone.  Pt's family invited to participate in the [employer]iving [employer].  List emailed to pt to complete and return.    t    Rachel Moulds, LCSW Clinical Social Worker [facility]    Patient is participating in a Managed Medicaid Plan:  Yes

## 2023-09-12 NOTE — Telephone Encounter (Signed)
Patient is aware of rescheduled times for appointment on 10/17/2023

## 2023-09-30 IMAGING — MR MR LUMBAR SPINE W/O CM
4 of 5 series · 27 of 48 positions shown · non-contrast
Comparison: None available.

CLINICAL DATA: Initial evaluation for chronic low back pain with
radiation into the right lower extremity.

EXAM:
MRI LUMBAR SPINE WITHOUT CONTRAST
TECHNIQUE: Multiplanar, multisequence MR imaging of the lumbar spine was
performed. No intravenous contrast was administered.

[Series 3: T2 · sagittal · 4.0mm · 1.09mm/px · 6 of 17 slices shown (1 of 2)]
[im 1/17]
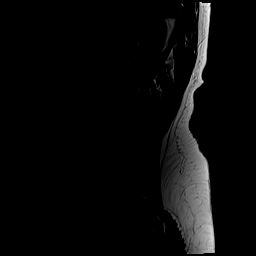
[im 4/17]
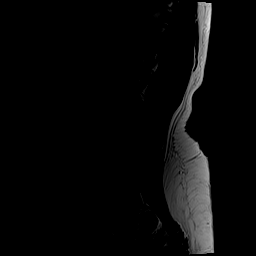
[im 7/17]
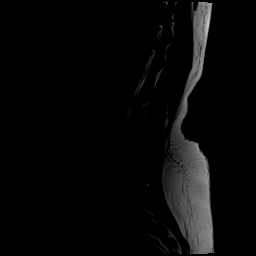
[im 10/17]
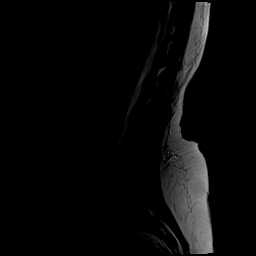
[im 13/17]
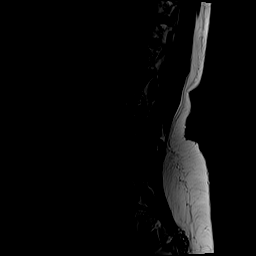
[im 17/17]
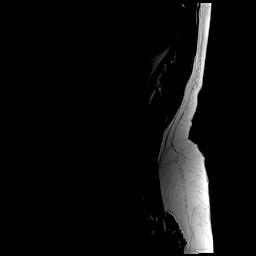

[Series 5: T1 · sagittal · 4.0mm · 1.09mm/px · 6 of 17 slices shown (1 of 2)]
[im 1/17]
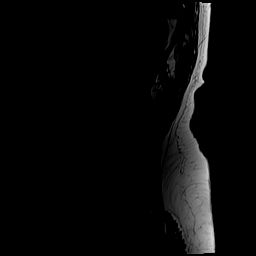
[im 4/17]
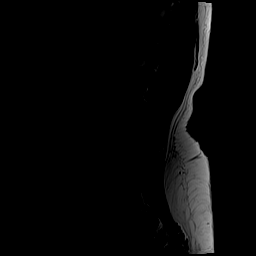
[im 7/17]
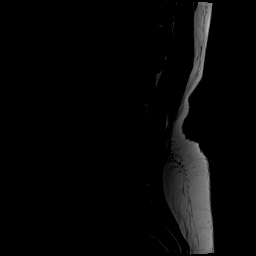
[im 10/17]
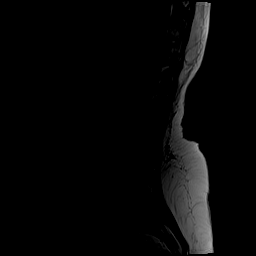
[im 13/17]
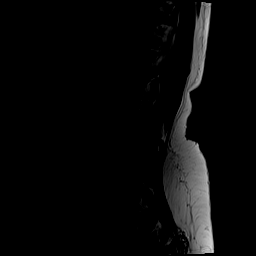
[im 17/17]
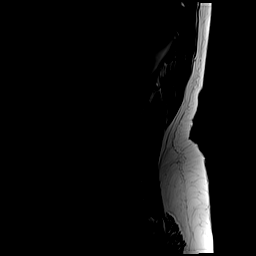

[Series 6: T2 · axial · 4.0mm · 0.39mm/px · z∈[-23,+207]mm · 9 of 40 slices shown (2 of 2)]
[im 1/40]
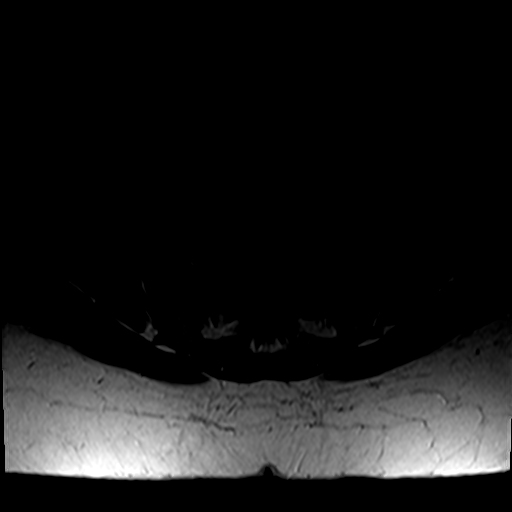
[im 6/40]
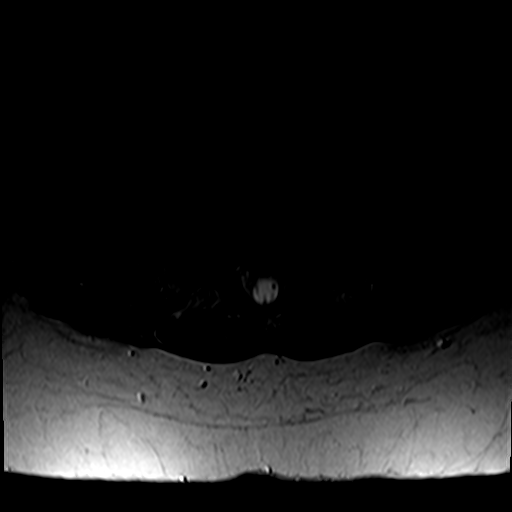
[im 12/40]
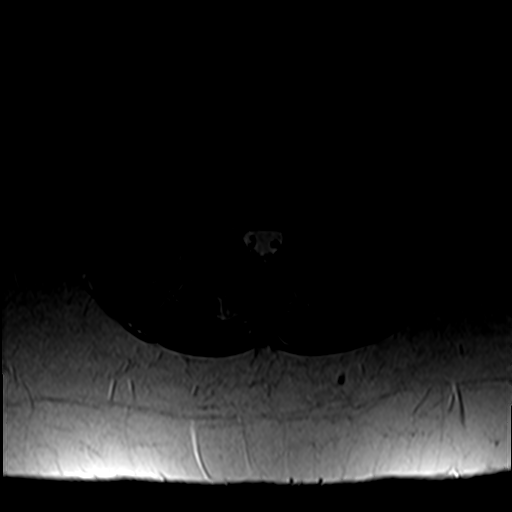
[im 17/40]
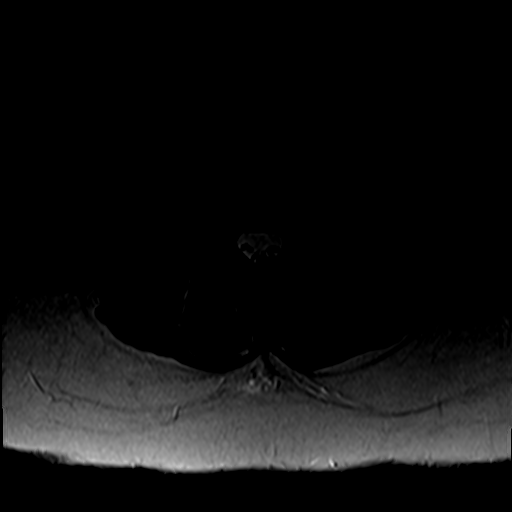
[im 20/40]
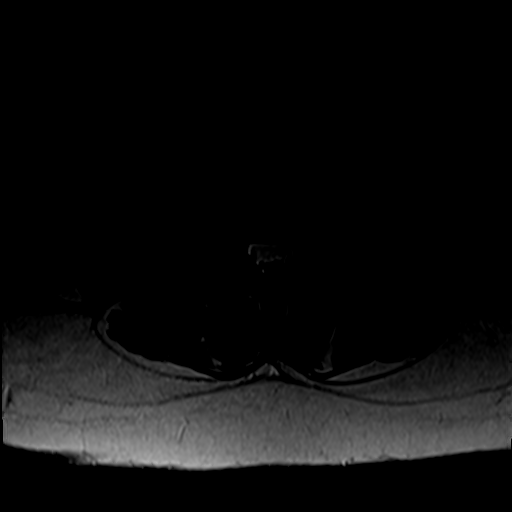
[im 23/40]
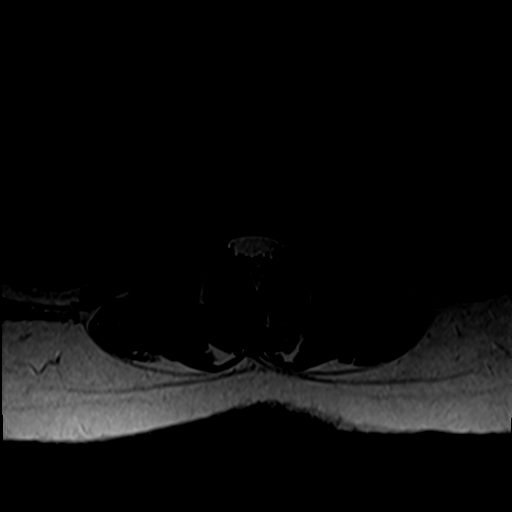
[im 28/40]
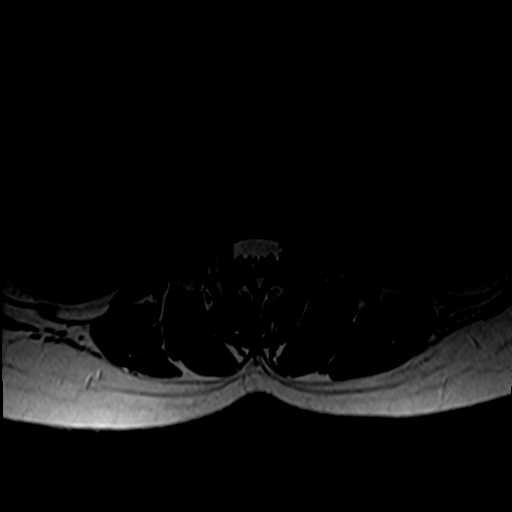
[im 34/40]
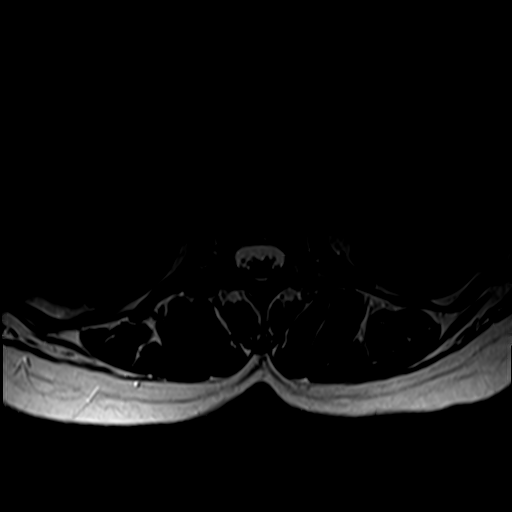
[im 40/40]
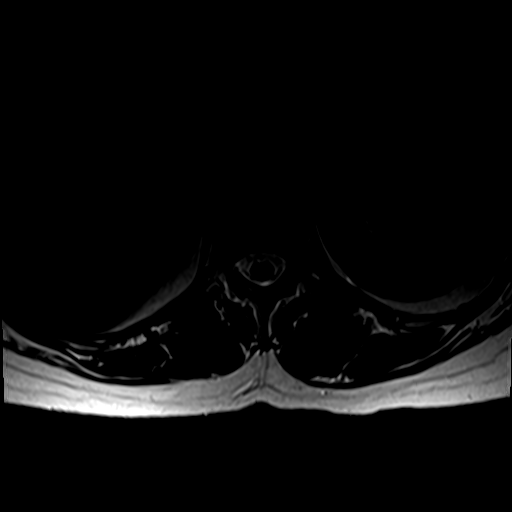

[Series 7: T1 · axial · 4.0mm · 0.39mm/px · z∈[-23,+179]mm · 6 of 40 slices shown (2 of 2)]
[im 1/40]
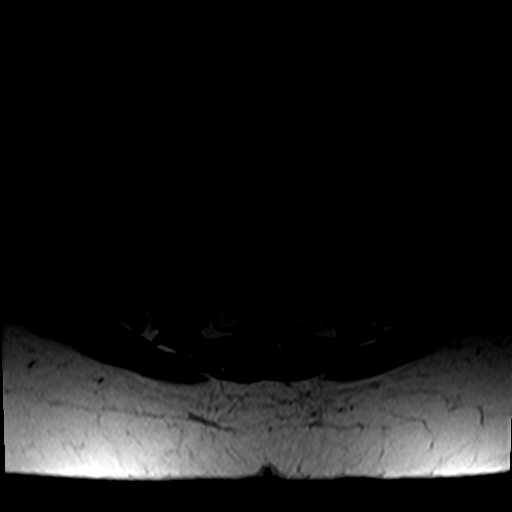
[im 6/40]
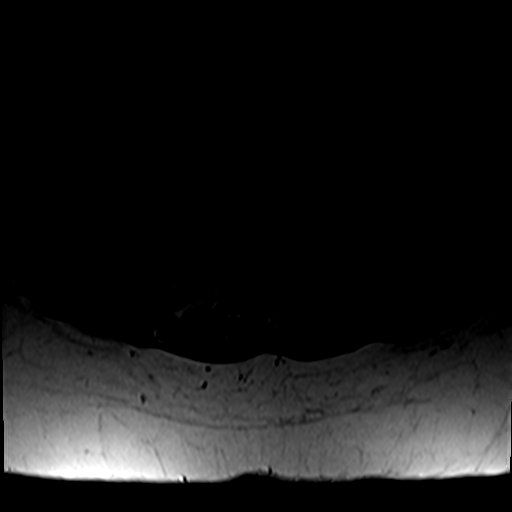
[im 12/40]
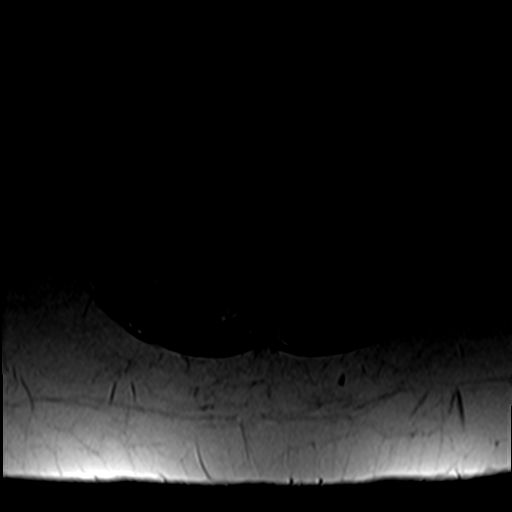
[im 17/40]
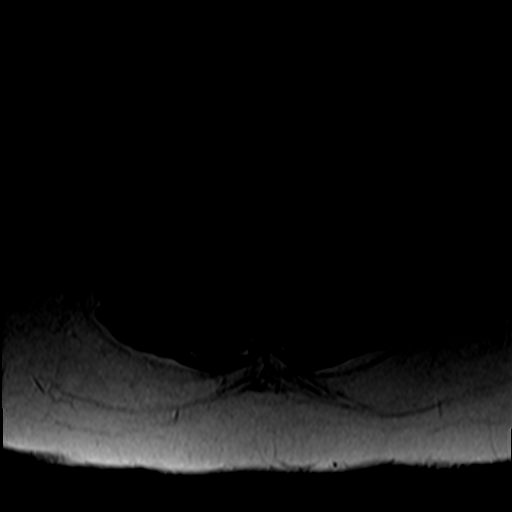
[im 20/40]
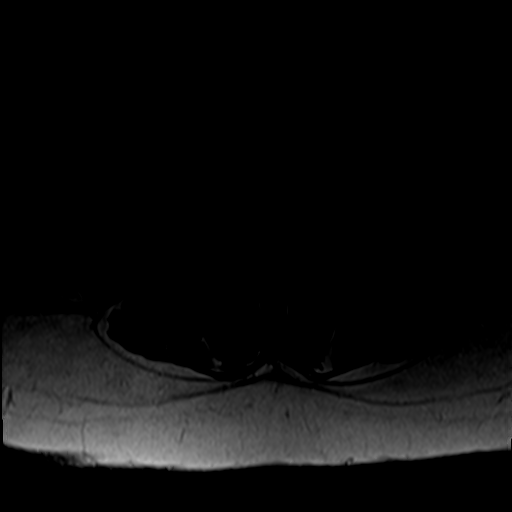
[im 34/40]
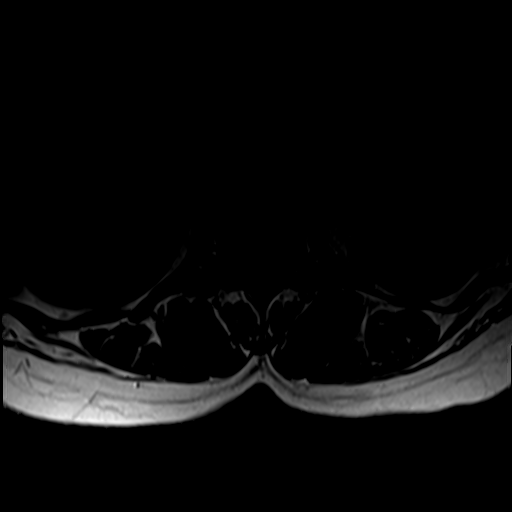

[27 of 48 positions shown; findings below may reference images not displayed]

FINDINGS: Segmentation: Standard. Lowest well-formed disc space labeled the
L5-S1 level.

Alignment: Physiologic with preservation of the normal lumbar
lordosis. No listhesis.

Vertebrae: Vertebral body height maintained without acute or chronic
fracture. Bone marrow signal intensity within normal limits.
Multiple scattered benign hemangiomata noted. No worrisome osseous
lesions. No abnormal marrow edema.

Conus medullaris and cauda equina: Conus extends to the L1-2 level.
Conus and cauda equina appear normal.

Paraspinal and other soft tissues: Unremarkable.

Disc levels:

L1-2:  Unremarkable.

L2-3:  Unremarkable.

L3-4: Mild disc bulge. No spinal stenosis. Foramina remain patent.

L4-5: Disc desiccation. Shallow central disc protrusion with annular
fissure indents the ventral thecal sac (series 6, image 27).
Resultant mild bilateral subarticular stenosis. Central canal
remains patent. No significant foraminal narrowing.

L5-S1: Normal interspace. Mild bilateral facet hypertrophy. No canal
or foraminal stenosis.
IMPRESSION: 1. Shallow central disc protrusion at L4-5 with resultant mild
bilateral subarticular stenosis.
2. Mild bilateral facet hypertrophy at L5-S1 without stenosis.

## 2023-10-17 ENCOUNTER — Inpatient Hospital Stay: Payer: Medicaid Other

## 2023-10-17 ENCOUNTER — Ambulatory Visit: Payer: Medicaid Other

## 2023-10-17 ENCOUNTER — Inpatient Hospital Stay: Payer: Medicaid Other | Attending: Physician Assistant

## 2023-10-17 ENCOUNTER — Other Ambulatory Visit: Payer: Medicaid Other

## 2023-10-17 VITALS — BP 131/74 | HR 69 | Temp 97.8°F | Resp 18

## 2023-10-17 DIAGNOSIS — Z79899 Other long term (current) drug therapy: Secondary | ICD-10-CM | POA: Diagnosis not present

## 2023-10-17 DIAGNOSIS — C7B02 Secondary carcinoid tumors of liver: Secondary | ICD-10-CM | POA: Insufficient documentation

## 2023-10-17 DIAGNOSIS — C7A012 Malignant carcinoid tumor of the ileum: Secondary | ICD-10-CM | POA: Diagnosis present

## 2023-10-17 LAB — CMP (CANCER CENTER ONLY)
ALT: 11 U/L (ref 0–44)
AST: 17 U/L (ref 15–41)
Albumin: 4.1 g/dL (ref 3.5–5.0)
Alkaline Phosphatase: 83 U/L (ref 38–126)
Anion gap: 8 (ref 5–15)
BUN: 10 mg/dL (ref 6–20)
CO2: 26 mmol/L (ref 22–32)
Calcium: 9.2 mg/dL (ref 8.9–10.3)
Chloride: 103 mmol/L (ref 98–111)
Creatinine: 0.54 mg/dL (ref 0.44–1.00)
GFR, Estimated: >60 mL/min (ref >60)
Glucose, Bld: 103 mg/dL — ABNORMAL HIGH (ref 70–99)
Potassium: 4.7 mmol/L (ref 3.5–5.1)
Sodium: 137 mmol/L (ref 135–145)
Total Bilirubin: 0.5 mg/dL (ref <1.2)
Total Protein: 7.3 g/dL (ref 6.5–8.1)

## 2023-10-17 LAB — CBC WITH DIFFERENTIAL (CANCER CENTER ONLY)
Abs Immature Granulocytes: 0.02 10*3/uL (ref 0.00–0.07)
Basophils Absolute: 0.1 10*3/uL (ref 0.0–0.1)
Basophils Relative: 1 %
Eosinophils Absolute: 0.1 10*3/uL (ref 0.0–0.5)
Eosinophils Relative: 2 %
HCT: 37.6 % (ref 36.0–46.0)
Hemoglobin: 12.2 g/dL (ref 12.0–15.0)
Immature Granulocytes: 0 %
Lymphocytes Relative: 49 %
Lymphs Abs: 3.4 10*3/uL (ref 0.7–4.0)
MCH: 27.4 pg (ref 26.0–34.0)
MCHC: 32.4 g/dL (ref 30.0–36.0)
MCV: 84.3 fL (ref 80.0–100.0)
Monocytes Absolute: 0.4 10*3/uL (ref 0.1–1.0)
Monocytes Relative: 6 %
Neutro Abs: 2.9 10*3/uL (ref 1.7–7.7)
Neutrophils Relative %: 42 %
Platelet Count: 342 10*3/uL (ref 150–400)
RBC: 4.46 MIL/uL (ref 3.87–5.11)
RDW: 17.2 % — ABNORMAL HIGH (ref 11.5–15.5)
WBC Count: 7 10*3/uL (ref 4.0–10.5)
nRBC: 0 % (ref 0.0–0.2)

## 2023-10-17 LAB — FERRITIN: Ferritin: 16 ng/mL (ref 11–307)

## 2023-10-17 MED ORDER — OCTREOTIDE ACETATE 30 MG IM KIT
30.0000 mg | PACK | Freq: Once | INTRAMUSCULAR | Status: AC
  Administered 2023-10-17: 30 mg via INTRAMUSCULAR
  Filled 2023-10-17: qty 1

## 2023-11-14 ENCOUNTER — Inpatient Hospital Stay: Payer: Medicaid Other | Admitting: Nurse Practitioner

## 2023-11-14 ENCOUNTER — Inpatient Hospital Stay: Payer: Medicaid Other | Attending: Physician Assistant

## 2023-11-14 VITALS — BP 128/82 | HR 76 | Temp 98.5°F | Resp 18

## 2023-11-14 DIAGNOSIS — C7B02 Secondary carcinoid tumors of liver: Secondary | ICD-10-CM | POA: Diagnosis present

## 2023-11-14 DIAGNOSIS — C7A012 Malignant carcinoid tumor of the ileum: Secondary | ICD-10-CM | POA: Diagnosis present

## 2023-11-14 DIAGNOSIS — Z79899 Other long term (current) drug therapy: Secondary | ICD-10-CM | POA: Diagnosis not present

## 2023-11-14 LAB — CMP (CANCER CENTER ONLY)
ALT: 10 U/L (ref 0–44)
AST: 13 U/L — ABNORMAL LOW (ref 15–41)
Albumin: 4 g/dL (ref 3.5–5.0)
Alkaline Phosphatase: 79 U/L (ref 38–126)
Anion gap: 5 (ref 5–15)
BUN: 10 mg/dL (ref 6–20)
CO2: 30 mmol/L (ref 22–32)
Calcium: 9.1 mg/dL (ref 8.9–10.3)
Chloride: 104 mmol/L (ref 98–111)
Creatinine: 0.55 mg/dL (ref 0.44–1.00)
GFR, Estimated: >60 mL/min (ref >60)
Glucose, Bld: 116 mg/dL — ABNORMAL HIGH (ref 70–99)
Potassium: 3.5 mmol/L (ref 3.5–5.1)
Sodium: 139 mmol/L (ref 135–145)
Total Bilirubin: 0.3 mg/dL (ref <1.2)
Total Protein: 7.3 g/dL (ref 6.5–8.1)

## 2023-11-14 LAB — CBC WITH DIFFERENTIAL (CANCER CENTER ONLY)
Abs Immature Granulocytes: 0.02 10*3/uL (ref 0.00–0.07)
Basophils Absolute: 0.1 10*3/uL (ref 0.0–0.1)
Basophils Relative: 1 %
Eosinophils Absolute: 0.1 10*3/uL (ref 0.0–0.5)
Eosinophils Relative: 1 %
HCT: 37.9 % (ref 36.0–46.0)
Hemoglobin: 12.6 g/dL (ref 12.0–15.0)
Immature Granulocytes: 0 %
Lymphocytes Relative: 44 %
Lymphs Abs: 3 10*3/uL (ref 0.7–4.0)
MCH: 27.9 pg (ref 26.0–34.0)
MCHC: 33.2 g/dL (ref 30.0–36.0)
MCV: 84 fL (ref 80.0–100.0)
Monocytes Absolute: 0.5 10*3/uL (ref 0.1–1.0)
Monocytes Relative: 7 %
Neutro Abs: 3.2 10*3/uL (ref 1.7–7.7)
Neutrophils Relative %: 47 %
Platelet Count: 353 10*3/uL (ref 150–400)
RBC: 4.51 MIL/uL (ref 3.87–5.11)
RDW: 15.6 % — ABNORMAL HIGH (ref 11.5–15.5)
WBC Count: 6.8 10*3/uL (ref 4.0–10.5)
nRBC: 0 % (ref 0.0–0.2)

## 2023-11-14 MED ORDER — OCTREOTIDE ACETATE 30 MG IM KIT
30.0000 mg | PACK | Freq: Once | INTRAMUSCULAR | Status: AC
  Administered 2023-11-14: 30 mg via INTRAMUSCULAR
  Filled 2023-11-14: qty 1

## 2023-11-14 NOTE — Progress Notes (Signed)
Labs adequate for treatment. Labs/follow up/treatment as scheduled

## 2023-11-23 ENCOUNTER — Encounter: Payer: Self-pay | Admitting: Student

## 2023-11-23 DIAGNOSIS — C7A012 Malignant carcinoid tumor of the ileum: Secondary | ICD-10-CM

## 2023-11-24 ENCOUNTER — Encounter: Payer: Self-pay | Admitting: Hematology

## 2023-12-04 ENCOUNTER — Ambulatory Visit (INDEPENDENT_AMBULATORY_CARE_PROVIDER_SITE_OTHER): Payer: Medicaid Other | Admitting: Gastroenterology

## 2023-12-04 ENCOUNTER — Encounter: Payer: Self-pay | Admitting: Gastroenterology

## 2023-12-04 ENCOUNTER — Other Ambulatory Visit: Payer: Medicaid Other

## 2023-12-04 VITALS — BP 100/80 | HR 60 | Ht 67.0 in | Wt 163.1 lb

## 2023-12-04 DIAGNOSIS — R1084 Generalized abdominal pain: Secondary | ICD-10-CM

## 2023-12-04 DIAGNOSIS — Z8619 Personal history of other infectious and parasitic diseases: Secondary | ICD-10-CM

## 2023-12-04 DIAGNOSIS — R140 Abdominal distension (gaseous): Secondary | ICD-10-CM

## 2023-12-04 DIAGNOSIS — C7A012 Malignant carcinoid tumor of the ileum: Secondary | ICD-10-CM | POA: Diagnosis not present

## 2023-12-04 MED ORDER — DICYCLOMINE HCL 20 MG PO TABS: 20.0000 mg | ORAL_TABLET | Freq: Four times a day (QID) | ORAL | 1 refills | Status: AC

## 2023-12-04 NOTE — Progress Notes (Signed)
HPI : Linda Villanueva is a 53 y.o. female with stage IV small bowel carcinoid tumor status post small bowel resection and currently on long-acting Sandostatin therapy who is referred to [place] by Levin Erp, MD for further evaluation of chronic bloating and abdominal discomfort. She is accompanied by her husband today who helps provide translation, although the patient's English is quite good.  The patient reports that ever since her surgery and treatment for her carcinoid tumor, she has had problems with significant abdominal bloating, abdominal discomfort and excessive flatulence/gas. Prior to her surgery, she reports having significant problems with diarrhea and urgency, having to go have a bowel movement immediately after a meal, or sometimes not being able to finish her meal before having to go.  Her stools were always loose and watery and she had significant urgency.  However, she never had problems with abdominal bloating or gas until after her surgery. Since the surgery and starting octreotide, she feels very bloated and gassy every time she eats.  If she does not eat, she does not have the symptoms.  It does not seem to matter what she eats, she will have bothersome flatulence and gas.  However, she has noted that certain foods will cause worse symptoms than others.  Specifically, she mentions chickpeas as well as these cookies she typically would have with tea.  She tried eliminating meat from her diet, but did not experience any improvement.  She reports symptoms of early satiety and excessive fullness with meals.  She has not lost weight, rather her weight has been increasing slightly over the past year. She and her husband note that when she was back home in the [place], she frequently drink buttermilk, and this seemed to help her symptoms.  She notes that drinking regular milk worsens her symptoms, and so she now uses lactose-free milk.  Her bowel movements have been much improved  since her surgery and octreotide therapy began.  Now, she typically has 1 or 2 stools per day.  Her stools are typically soft and formed.  No significant urgency.  She has a history of H. pylori infection and recalls being treated in 2019.   Oncologic history (taken from Dr. 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   -presented with upper abdominal pain with nausea and vomiting. CT 05/16/22 revealed: 2.9 cm mesenteric mass; multiple liver lesions; focal segment of hyperenhancing small bowel; findings suspicious for partial small bowel obstruction.  -S/p emergent resection on 05/18/22 by Dr. Michaell Cowing. Small bowel mass pathology showed 1 cm well-differentiated NET, grade 1, infiltrating serosa, and an associated 3.2 cm mesenteric mass. Margins negative, 1/15 positive lymph nodes. Liver pathology confirmed metastatic NET. -Baseline chromogranin A on 05/17/22 elevated at 327.9, which has decreased on sandostatin  -24hr urine obtained 06/11/22 showed elevated 5-HIAA. -she began first-line sandostatin injections 7/23, increased to full dose in 07/2022. -CT scan in September 2023 showed mild disease progression, she is clinically doing well   CT C/A/P Aug 2024 IMPRESSION: CT CHEST IMPRESSION   1.  No acute process or evidence of metastatic disease in the chest. 2. 3 mm right upper lobe pulmonary nodule is unchanged and considered benign.   CT ABDOMEN AND PELVIS IMPRESSION   1. Although the extent of liver metastasis is suboptimally evaluated given lack of arterial phase imaging, mild response to therapy is identified. 2. Surgical changes  within the small bowel. Mild small bowel mesenteric adenopathy, similar. 3. Right ovarian corpus luteal cyst   Past Medical History:  Diagnosis Date   Vitamin B12 deficiency    Vitamin D deficiency      Past Surgical History:   Procedure Laterality Date   INNER EAR SURGERY     LAPAROTOMY N/A 05/18/2022   Procedure: EXPLORATORY LAPAROTOMY; SMALL BOWEL RESECTION; WEDGE LIVER BIOPSY;  Surgeon: Karie Soda, MD;  Location: [facility];  Service: General;  Laterality: N/A;   Family History  Problem Relation Age of Onset   Hypertension Mother    CAD Mother    Hypertension Father    Thyroid disease Sister    Colon cancer Other 57 - 33   Social History   Tobacco Use   Smoking status: Never   Smokeless tobacco: Never  Vaping Use   Vaping status: Never Used  Substance Use Topics   Alcohol use: No   Drug use: No   Current Outpatient Medications  Medication Sig Dispense Refill   amLODipine (NORVASC) 2.5 MG tablet Take 1 tablet (2.5 mg total) by mouth at bedtime. 90 tablet 3   cholecalciferol (VITAMIN D3) 25 MCG (1000 UNIT) tablet Take 1,000 Units by mouth daily.     cyanocobalamin (VITAMIN B12) 500 MCG tablet Take 500 mcg by mouth daily.     octreotide (SANDOSTATIN LAR) 30 MG injection Inject 30 mg into the muscle every 28 (twenty-eight) days.     No current facility-administered medications for this visit.   No Known Allergies   Review of Systems: All systems reviewed and negative except where noted in HPI.    No results found.  Physical Exam: BP 100/80 (BP Location: Left Arm, Patient Position: Sitting, Cuff Size: Normal)   Pulse 60   Ht 5\' 7"  (1.702 m)   Wt 163 lb 2 oz (74 kg)   SpO2 99%   BMI 25.55 kg/m  Constitutional: Pleasant,well-developed, [place] female in no acute distress.  Accompanied by husband. HEENT: Normocephalic and atraumatic. Conjunctivae are normal. No scleral icterus. Neck supple.  Cardiovascular: Normal rate, regular rhythm.  Pulmonary/chest: Effort normal and breath sounds normal. No wheezing, rales or rhonchi. Abdominal: Soft, nondistended, nontender. Bowel sounds normoactive throughout. There are no masses palpable. No hepatomegaly. Extremities: no  edema Lymphadenopathy: No cervical adenopathy noted. Neurological: Alert and oriented to person place and time. Skin: Skin is warm and dry. No rashes noted. Psychiatric: Normal mood and affect. Behavior is normal.  CBC    Component Value Date/Time   WBC 6.8 11/14/2023 1356   WBC 6.6 05/21/2023 1323   RBC 4.51 11/14/2023 1356   HGB 12.6 11/14/2023 1356   HGB 10.8 (L) 03/27/2023 1645   HCT 37.9 11/14/2023 1356   HCT 34.9 03/27/2023 1645   PLT 353 11/14/2023 1356   PLT 333 03/27/2023 1645   MCV 84.0 11/14/2023 1356   MCV 84 03/27/2023 1645   MCH 27.9 11/14/2023 1356   MCHC 33.2 11/14/2023 1356   RDW 15.6 (H) 11/14/2023 1356   RDW 14.9 03/27/2023 1645   LYMPHSABS 3.0 11/14/2023 1356   LYMPHSABS 3.3 (H) 03/27/2023 1645   MONOABS 0.5 11/14/2023 1356   EOSABS 0.1 11/14/2023 1356   EOSABS 0.2 03/27/2023 1645   BASOSABS 0.1 11/14/2023 1356   BASOSABS 0.1 03/27/2023 1645    CMP     Component Value Date/Time   NA 139 11/14/2023 1356   NA 138 03/27/2023 1645   K 3.5 11/14/2023 1356   CL 104  11/14/2023 1356   CO2 30 11/14/2023 1356   GLUCOSE 116 (H) 11/14/2023 1356   BUN 10 11/14/2023 1356   BUN 10 03/27/2023 1645   CREATININE 0.55 11/14/2023 1356   CALCIUM 9.1 11/14/2023 1356   PROT 7.3 11/14/2023 1356   ALBUMIN 4.0 11/14/2023 1356   AST 13 (L) 11/14/2023 1356   ALT 10 11/14/2023 1356   ALKPHOS 79 11/14/2023 1356   BILITOT 0.3 11/14/2023 1356   GFRNONAA >60 11/14/2023 1356   GFRAA >60 06/03/2018 2052       Latest Ref Rng & Units 11/14/2023    1:56 PM 10/17/2023   11:08 AM 09/11/2023    1:13 PM  CBC EXTENDED  WBC 4.0 - 10.5 K/uL 6.8  7.0  7.0   RBC 3.87 - 5.11 MIL/uL 4.51  4.46  4.19   Hemoglobin 12.0 - 15.0 g/dL 16.1  09.6  04.5   HCT 36.0 - 46.0 % 37.9  37.6  34.0   Platelets 150 - 400 K/uL 353  342  317   NEUT# 1.7 - 7.7 K/uL 3.2  2.9  3.3   Lymph# 0.7 - 4.0 K/uL 3.0  3.4  3.0       ASSESSMENT AND PLAN:  53 year old female diagnosed with metastatic  small bowel carcinoid tumor status post resection and treated with octreotide, who has been experiencing very bothersome excessive flatulence and intestinal gas/bloating ever since her surgery and subsequent treatment. We discussed how octreotide [place] cause excessive bloating and flatulence as a side effect.  We also discussed how intestinal surgery can sometimes lead to alterations in gut flora and bacterial overgrowth.  We discussed the role of the gut flora and diet and contributing to symptoms of intestinal gas and bloating. Discussed dietary modifications, including the low FODMAP diet as well as eliminating foods known to cause excessive gas production such as cruciferous vegetables and legumes. As she has reported that buttermilk seem to help with her symptoms, I did suggest she try incorporating this more regularly into her diet. Will prescribe Bentyl to see if this helps with some of her abdominal discomfort. - Order breath test for bacterial overgrowth - Provide handouts on low FODMAP diet/intestinal gas - Prescribe Bentyl 20 mg p.o. every 6 hours as needed - Advise limiting/eliminating cruciferous vegetables and legumes - Check H. pylori stool antigen, celiac serology  Carcinoid Tumor Previous surgery and ongoing octreotide injections. Monitored with blood tests every three months and CT scans every six months to assess carcinoid levels. Discussed the long-term plan of continuing octreotide injections and regular monitoring for four to five years. - Continue monthly octreotide injections - Continue regular blood tests every three months - Continue CT scans every six months  H. pylori Infection Previous H. pylori infection treated in 2019. Symptoms of bloating and discomfort warrant re-evaluation for potential recurrence. Discussed the need for a stool test to check for H. pylori recurrence. - Order stool test for H. pylori  Xaiver Roskelley E. Tomasa Rand, MD  Gastroenterology  I spent a  total of 45 minutes reviewing the patient's medical record, interviewing and examining the patient, discussing her diagnosis and management of her condition going forward, and documenting in the medical record   Levin Erp, MD

## 2023-12-04 NOTE — Patient Instructions (Signed)
Your provider has requested that you go to the basement level for lab work before leaving today. Press "B" on the elevator. The lab is located at the first door on the left as you exit the elevator.  _______________________________________________________  If your blood pressure at your visit was 140/90 or greater, please contact your primary care physician to follow up on this.  _______________________________________________________  If you are age 51 or older, your body mass index should be between 23-30. Your Body mass index is 25.55 kg/m. If this is out of the aforementioned range listed, please consider follow up with your Primary Care Provider.  If you are age 28 or younger, your body mass index should be between 19-25. Your Body mass index is 25.55 kg/m. If this is out of the aformentioned range listed, please consider follow up with your Primary Care Provider.   ________________________________________________________  The [place] [facility] providers would like to encourage you to use [facility] to communicate with providers for non-urgent requests or questions.  Due to long hold times on the telephone, sending your provider a message by [facility] be a faster and more efficient way to get a response.  Please allow 48 business hours for a response.  Please remember that this is for non-urgent requests.  _______________________________________________________   It was a pleasure to see you today!  Thank you for trusting me with your gastrointestinal care!    Scott E.Tomasa Rand, MD

## 2023-12-05 LAB — TISSUE TRANSGLUTAMINASE, IGA: (tTG) Ab, IgA: <1 U/mL

## 2023-12-05 LAB — IGA: Immunoglobulin A: 260 mg/dL (ref 47–310)

## 2023-12-10 ENCOUNTER — Encounter: Payer: Self-pay | Admitting: Gastroenterology

## 2023-12-10 NOTE — Progress Notes (Signed)
Linda Villanueva, The test for celiac disease were negative (normal).  Awaiting results from stool test and breath test.

## 2023-12-17 ENCOUNTER — Other Ambulatory Visit: Payer: Medicaid Other

## 2023-12-17 ENCOUNTER — Other Ambulatory Visit: Payer: Self-pay | Admitting: Nurse Practitioner

## 2023-12-17 DIAGNOSIS — C7A012 Malignant carcinoid tumor of the ileum: Secondary | ICD-10-CM

## 2023-12-17 DIAGNOSIS — R1084 Generalized abdominal pain: Secondary | ICD-10-CM | POA: Diagnosis not present

## 2023-12-17 DIAGNOSIS — R140 Abdominal distension (gaseous): Secondary | ICD-10-CM

## 2023-12-17 NOTE — Assessment & Plan Note (Addendum)
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   -presented with upper abdominal pain with nausea and vomiting. CT 05/16/22 revealed: 2.9 cm mesenteric mass; multiple liver lesions; focal segment of hyperenhancing small bowel; findings suspicious for partial small bowel obstruction.  -S/p emergent resection on 05/18/22 by Dr. Michaell Cowing. Small bowel mass pathology showed 1 cm well-differentiated NET, grade 1, infiltrating serosa, and an associated 3.2 cm mesenteric mass. Margins negative, 1/15 positive lymph nodes. Liver pathology confirmed metastatic NET. -Baseline chromogranin A on 05/17/22 elevated at 327.9, which has decreased on sandostatin  -24hr urine obtained 06/11/22 showed elevated 5-HIAA. -she began first-line sandostatin injections 7/23, increased to full dose in 07/2022. -CT scan in September 2023 showed mild disease progression, she is clinically doing well. -Restaging CT CAP in September 2024 showed stable disease with mild response to treatment. -New CT CAP due in March 2025.  Order placed during today's visit, 12/18/2023. -Continue with monthly Sandostatin injections.  Follow-up in office every 3 months.

## 2023-12-17 NOTE — Progress Notes (Signed)
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   -presented with upper abdominal pain with nausea and vomiting. CT 05/16/22 revealed: 2.9 cm mesenteric mass; multiple liver lesions; focal segment of hyperenhancing small bowel; findings suspicious for partial small bowel obstruction.  -S/p emergent resection on 05/18/22 by Dr. Michaell Cowing. Small bowel mass pathology showed 1 cm well-differentiated NET, grade 1, infiltrating serosa, and an associated 3.2 cm mesenteric mass. Margins negative, 1/15 positive lymph nodes. Liver pathology confirmed metastatic NET. -Baseline chromogranin A on 05/17/22 elevated at 327.9, which has decreased on sandostatin  -24hr urine obtained 06/11/22 showed elevated 5-HIAA. -she began first-line sandostatin injections 7/23, increased to full dose in 07/2022. -CT scan in September 2023 showed mild disease progression, she is clinically doing well. -Restaging CT CAP in September 2024 showed stable disease with mild response to treatment.    Plan: Labs reviewed  -CBC showing WBC 7.0; Hgb 12.6; Hct 38.4; Plt 330; Anc 3.2 -CMP - K 3.6; glucose 97; BUN 12; Creatinine 0.53; eGFR >60; Ca 8.7; LFTs normal.   Patient will continue to see GI for problems with diarrhea.  Reports she did give stool sample yesterday.  Waiting for results. Patient performed status and labs are satisfactory for treatment today. Proceed with Sandostatin injection today. New CT scan chest abdomen pelvis due in March 2025.  Ordered today. Continue monthly Sandostatin injections. Labs with follow-up and  injection in 3 months.  The patient understands the plans discussed today and is in agreement with them.  She knows to contact our office if she develops concerns prior to her next appointment.  I provided 25 minutes of face-to-face time during this encounter and > 50% was spent counseling as documented under my assessment and plan.    Carlean Jews, NP  [facility] - A DEPT OF [facility] [address] [place] [address] Dept: [phone] Dept Fax: [phone]   Orders Placed This Encounter  Procedures   CT CHEST ABDOMEN PELVIS W CONTRAST    Standing Status:   Future    Expected Date:   02/15/2024    Expiration Date:   12/17/2024    If indicated for the ordered procedure, I authorize the administration of contrast media per Radiology protocol:   Yes    Does the patient have a contrast media/X-ray dye allergy?:   No    Preferred imaging location?:   [facility]    If indicated for the ordered procedure, I authorize the administration of oral contrast media per Radiology protocol:   Yes      CHIEF COMPLAINT:  CC: malignant carcinoid tumor ileum   Current Treatment:  Sandostatin injection monthly   INTERVAL HISTORY:  Linda Villanueva is here today for repeat clinical assessment.  She was last seen by Dr. Mosetta Putt on 09/11/2023.  Has continued to get monthly Sandostatin injections.  Was started on oral iron daily.  New iron deficiency anemia causing fatigue.  12/18/2023, hemoglobin hematocrit are normal.  Most recent restaging CT of chest abdomen pelvis showed stable disease.  Recently, she has started  developing loose stools and intestinal cramping.  Now seeing GI provider.  Did have to give stool sample yesterday.  Results are pending. She denies chest pain, chest pressure, or shortness of breath. She denies headaches or visual disturbances.  She denies nausea and vomiting.  She denies fevers or chills. She denies pain.  Her appetite is fair. Her weight has been stable.  I have reviewed the past medical history, past surgical history, social history and family history with the patient and they are unchanged from previous note.  ALLERGIES:  has no known allergies.  MEDICATIONS:  Current Outpatient Medications  Medication Sig Dispense Refill   amLODipine (NORVASC) 2.5 MG tablet Take 1 tablet (2.5 mg total) by mouth at bedtime. 90 tablet 3   cholecalciferol (VITAMIN D3) 25 MCG (1000 UNIT) tablet Take 1,000 Units by mouth daily.     cyanocobalamin (VITAMIN B12) 500 MCG tablet Take 500 mcg by mouth daily.     octreotide (SANDOSTATIN LAR) 30 MG injection Inject 30 mg into the muscle every 28 (twenty-eight) days.     dicyclomine (BENTYL) 20 MG tablet Take 1 tablet (20 mg total) by mouth every 6 (six) hours. (Patient not taking: Reported on 12/18/2023) 30 tablet 1   No current facility-administered medications for this visit.    HISTORY OF PRESENT ILLNESS:   Oncology History Overview Note   Cancer Staging  Malignant carcinoid tumor of small intestine (HCC) Staging form: Small Intestine - Other Histologies, AJCC 8th Edition - Pathologic stage from 05/18/2022: pT3, pN2, cM1 - Signed by Malachy Mood, MD on 07/11/2022 Histologic grade (G): G1 Histologic grading system: 4 grade system     Malignant carcinoid tumor of small intestine (HCC)  05/16/2022 Imaging   CT ABDOMEN PELVIS W CONTRAST    IMPRESSION: 1. Multiple mildly dilated fluid-filled loops of mid small bowel, suspicious for partial small bowel obstruction, poorly defined transition point. Some mesenteric vascular congestion but no intramural air or bowel wall thickening. 2. 2.9 cm enhancing central mesenteric mass. 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 Focal segment of hyperenhancing small bowel could be potential source/site of primary. 3. Small moderate volume free  fluid in the pelvis 4. Mildly prominent common bile duct which Lilac be correlated with LFTs.    05/17/2022 Imaging   CT CHEST WO CONTRAST  IMPRESSION: Indeterminate solid 3 mm right upper lobe pulmonary nodule.   Multiple liver masses best seen on recent contrast enhanced CT of the abdomen and pelvis.   05/17/2022 Tumor Marker   Patient's tumor was tested for the following markers: Chromogranin A. Results of the tumor marker test revealed elevation at 327.9.   05/18/2022 Procedure   POST-OPERATIVE DIAGNOSIS:   JEJUNAL MASS - PARTIALLY OBSTRUCTING LIVER MASSES PROBABLE METASTATIC CARCINOID    PROCEDURE:   EXPLORATORY LAPAROTOMY SMALL BOWEL RESECTION WEDGE LIVER BIOPSY   SURGEON:  Ardeth Sportsman, MD   OR FINDINGS: 15 mm small bowel nodule at junction of jejunum and ileum causing partial obstruction.  Moderately bulky lymphadenopathy in the mesentery proximal to the nodule.  En bloc resection done.  230 cm of small intestine remaining from ligament of Treitz to ileocecal valve.  Numerous miliary 68mm-60 mm liver nodules.  Wedge resection done of right hepatic lobe just lateral to the gallbladder hepatic fossa.   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       05/18/2022 Pathology Results   SURGICAL PATHOLOGY  CASE: [phone]  PATIENT: Redith Langdon  Surgical Pathology Report   FINAL MICROSCOPIC DIAGNOSIS:   A. SMALL BOWEL, MASS, RESECTION:  Well-differentiated neuroendocrine tumor, G1  Tumor measures 1.0 x 0.9 x 0.5 cm  Tumor infiltrates into the serosa (pT3)  Margins free  One of 15 lymph nodes with metastatic tumor and an associated mesenteric  mass measuring 3.2 cm (pN2) (see comment within template)  Perineural invasion present   B. LIVER, WEDGE RESECTION:  Metastatic well differentiated neuroendocrine tumor, 5 foci (ranging  from 7 mm to 0.35 mm)   Pathologic Stage Classification (pTNM, AJCC 8th Edition): pT3, pN2    05/18/2022  Cancer Staging   Staging form: Small Intestine - Other Histologies, AJCC 8th Edition - Pathologic stage from 05/18/2022: pT3, pN2, cM1 - Signed by Malachy Mood, MD on 07/11/2022 Histologic grade (G): G1 Histologic grading system: 4 grade system   06/07/2022 Initial Diagnosis   Malignant carcinoid tumor of unknown primary site [facility])   07/2023 Imaging   CT chest abdomen and pelvis with contrast   IMPRESSION: CT CHEST IMPRESSION  1.  No acute process or evidence of metastatic disease in the chest. 2. 3 mm right upper lobe pulmonary nodule is unchanged and considered benign.  CT ABDOMEN AND PELVIS IMPRESSION  1. Although the extent of liver metastasis is suboptimally evaluated given lack of arterial phase imaging, mild response to therapy is identified. 2. Surgical changes within the small bowel. Mild small bowel mesenteric adenopathy, similar. 3. Right ovarian corpus luteal cyst       REVIEW OF SYSTEMS:   Constitutional: Denies fevers, chills or abnormal weight loss Eyes: Denies blurriness of vision Ears, nose, mouth, throat, and face: Denies mucositis or sore throat Respiratory: Denies cough, dyspnea or wheezes Cardiovascular: Denies palpitation, chest discomfort or lower extremity swelling Gastrointestinal:  Denies nausea or heartburn.  Having frequent episodes of intestinal cramping and diarrhea. Skin: Denies abnormal skin rashes Lymphatics: Denies new lymphadenopathy or easy bruising Neurological:Denies numbness, tingling or new weaknesses Behavioral/Psych: Mood is stable, no new changes  All other systems were reviewed with the patient and are negative.   VITALS:   Today's Vitals   12/18/23 1344 12/18/23 1353  BP: (!) 140/83   Pulse: 71   Resp: 16   Temp: 97.9 F (36.6 C)   TempSrc: Temporal   SpO2: 100%   Weight: 163 lb 4.8 oz (74.1 kg)   Height: 5\' 7"  (1.702 m)   PainSc:  0-No pain   Body mass index is 25.58 kg/m.   Wt Readings from Last 3 Encounters:  12/18/23  163 lb 4.8 oz (74.1 kg)  12/04/23 163 lb 2 oz (74 kg)  09/11/23 161 lb 8 oz (73.3 kg)    Body mass index is 25.58 kg/m.  Performance status (ECOG): 1 - Symptomatic but completely ambulatory  PHYSICAL EXAM:   GENERAL:alert, no distress and comfortable SKIN: skin color, texture, turgor are normal, no rashes or significant lesions EYES: normal, Conjunctiva are pink and non-injected, sclera clear OROPHARYNX:no exudate, no erythema and lips, buccal mucosa, and tongue normal  NECK: supple, thyroid normal size, non-tender, without nodularity LYMPH:  no palpable lymphadenopathy in the cervical, axillary or inguinal LUNGS: clear to auscultation and percussion with normal breathing effort HEART: regular rate & rhythm and no murmurs and no lower extremity edema ABDOMEN:abdomen soft with hypoactive bowel sounds.  Mild, generalized abdominal tenderness with palpation today. Musculoskeletal:no cyanosis of digits and no clubbing  NEURO: alert & oriented x 3 with fluent speech, no focal motor/sensory  deficits  LABORATORY DATA:  I have reviewed the data as listed    Component Value Date/Time   NA 138 12/18/2023 1323   NA 138 03/27/2023 1645   K 3.6 12/18/2023 1323   CL 105 12/18/2023 1323   CO2 25 12/18/2023 1323   GLUCOSE 97 12/18/2023 1323   BUN 12 12/18/2023 1323   BUN 10 03/27/2023 1645   CREATININE 0.53 12/18/2023 1323   CALCIUM 8.7 (L) 12/18/2023 1323   PROT 7.2 12/18/2023 1323   ALBUMIN 4.1 12/18/2023 1323   AST 14 (L) 12/18/2023 1323   ALT 10 12/18/2023 1323   ALKPHOS 85 12/18/2023 1323   BILITOT 0.3 12/18/2023 1323   GFRNONAA >60 12/18/2023 1323   GFRAA >60 06/03/2018 2052   Lab Results  Component Value Date   WBC 7.0 12/18/2023   NEUTROABS 3.2 12/18/2023   HGB 12.6 12/18/2023   HCT 38.4 12/18/2023   MCV 86.3 12/18/2023   PLT 330 12/18/2023

## 2023-12-18 ENCOUNTER — Inpatient Hospital Stay: Payer: Medicaid Other

## 2023-12-18 ENCOUNTER — Inpatient Hospital Stay: Payer: Medicaid Other | Attending: Physician Assistant | Admitting: Nurse Practitioner

## 2023-12-18 VITALS — BP 149/85 | HR 67 | Temp 97.7°F | Resp 16

## 2023-12-18 VITALS — BP 140/83 | HR 71 | Temp 97.9°F | Resp 16 | Ht 67.0 in | Wt 163.3 lb

## 2023-12-18 DIAGNOSIS — C7B02 Secondary carcinoid tumors of liver: Secondary | ICD-10-CM | POA: Diagnosis present

## 2023-12-18 DIAGNOSIS — R112 Nausea with vomiting, unspecified: Secondary | ICD-10-CM | POA: Insufficient documentation

## 2023-12-18 DIAGNOSIS — Z79899 Other long term (current) drug therapy: Secondary | ICD-10-CM | POA: Diagnosis not present

## 2023-12-18 DIAGNOSIS — R1010 Upper abdominal pain, unspecified: Secondary | ICD-10-CM | POA: Insufficient documentation

## 2023-12-18 DIAGNOSIS — R590 Localized enlarged lymph nodes: Secondary | ICD-10-CM | POA: Insufficient documentation

## 2023-12-18 DIAGNOSIS — R197 Diarrhea, unspecified: Secondary | ICD-10-CM | POA: Diagnosis not present

## 2023-12-18 DIAGNOSIS — D509 Iron deficiency anemia, unspecified: Secondary | ICD-10-CM | POA: Insufficient documentation

## 2023-12-18 DIAGNOSIS — C7A012 Malignant carcinoid tumor of the ileum: Secondary | ICD-10-CM

## 2023-12-18 DIAGNOSIS — R160 Hepatomegaly, not elsewhere classified: Secondary | ICD-10-CM | POA: Insufficient documentation

## 2023-12-18 DIAGNOSIS — R5383 Other fatigue: Secondary | ICD-10-CM | POA: Diagnosis not present

## 2023-12-18 DIAGNOSIS — R911 Solitary pulmonary nodule: Secondary | ICD-10-CM | POA: Diagnosis not present

## 2023-12-18 LAB — CBC WITH DIFFERENTIAL (CANCER CENTER ONLY)
Abs Immature Granulocytes: 0.02 10*3/uL (ref 0.00–0.07)
Basophils Absolute: 0.1 10*3/uL (ref 0.0–0.1)
Basophils Relative: 1 %
Eosinophils Absolute: 0.1 10*3/uL (ref 0.0–0.5)
Eosinophils Relative: 1 %
HCT: 38.4 % (ref 36.0–46.0)
Hemoglobin: 12.6 g/dL (ref 12.0–15.0)
Immature Granulocytes: 0 %
Lymphocytes Relative: 45 %
Lymphs Abs: 3.1 10*3/uL (ref 0.7–4.0)
MCH: 28.3 pg (ref 26.0–34.0)
MCHC: 32.8 g/dL (ref 30.0–36.0)
MCV: 86.3 fL (ref 80.0–100.0)
Monocytes Absolute: 0.5 10*3/uL (ref 0.1–1.0)
Monocytes Relative: 7 %
Neutro Abs: 3.2 10*3/uL (ref 1.7–7.7)
Neutrophils Relative %: 46 %
Platelet Count: 330 10*3/uL (ref 150–400)
RBC: 4.45 MIL/uL (ref 3.87–5.11)
RDW: 14.1 % (ref 11.5–15.5)
WBC Count: 7 10*3/uL (ref 4.0–10.5)
nRBC: 0 % (ref 0.0–0.2)

## 2023-12-18 LAB — CMP (CANCER CENTER ONLY)
ALT: 10 U/L (ref 0–44)
AST: 14 U/L — ABNORMAL LOW (ref 15–41)
Albumin: 4.1 g/dL (ref 3.5–5.0)
Alkaline Phosphatase: 85 U/L (ref 38–126)
Anion gap: 8 (ref 5–15)
BUN: 12 mg/dL (ref 6–20)
CO2: 25 mmol/L (ref 22–32)
Calcium: 8.7 mg/dL — ABNORMAL LOW (ref 8.9–10.3)
Chloride: 105 mmol/L (ref 98–111)
Creatinine: 0.53 mg/dL (ref 0.44–1.00)
GFR, Estimated: >60 mL/min (ref >60)
Glucose, Bld: 97 mg/dL (ref 70–99)
Potassium: 3.6 mmol/L (ref 3.5–5.1)
Sodium: 138 mmol/L (ref 135–145)
Total Bilirubin: 0.3 mg/dL (ref 0.0–1.2)
Total Protein: 7.2 g/dL (ref 6.5–8.1)

## 2023-12-18 MED ORDER — OCTREOTIDE ACETATE 30 MG IM KIT
30.0000 mg | PACK | Freq: Once | INTRAMUSCULAR | Status: AC
Start: 2023-12-18 — End: 2023-12-18
  Administered 2023-12-18: 30 mg via INTRAMUSCULAR
  Filled 2023-12-18: qty 1

## 2023-12-19 LAB — H. PYLORI ANTIGEN, STOOL: H pylori Ag, Stl: NEGATIVE

## 2023-12-19 LAB — CHROMOGRANIN A: Chromogranin A (ng/mL): 156.5 ng/mL — ABNORMAL HIGH (ref 0.0–101.8)

## 2023-12-21 ENCOUNTER — Encounter: Payer: Self-pay | Admitting: Hematology

## 2023-12-21 ENCOUNTER — Encounter: Payer: Self-pay | Admitting: Nurse Practitioner

## 2023-12-23 ENCOUNTER — Encounter: Payer: Self-pay | Admitting: Gastroenterology

## 2023-12-23 NOTE — Progress Notes (Signed)
Linda Villanueva,  Your H. Pylori stool test was negative.  Will await breath test results.

## 2024-01-07 ENCOUNTER — Other Ambulatory Visit: Payer: Self-pay

## 2024-02-13 ENCOUNTER — Ambulatory Visit (HOSPITAL_COMMUNITY)
Admission: RE | Admit: 2024-02-13 | Discharge: 2024-02-13 | Disposition: A | Discharge status: Home / Self Care | Payer: Medicaid Other | Source: Ambulatory Visit | Attending: Nurse Practitioner | Admitting: Nurse Practitioner

## 2024-02-13 ENCOUNTER — Encounter (HOSPITAL_COMMUNITY): Payer: Self-pay

## 2024-02-13 DIAGNOSIS — C7A012 Malignant carcinoid tumor of the ileum: Secondary | ICD-10-CM | POA: Insufficient documentation

## 2024-02-13 DIAGNOSIS — K769 Liver disease, unspecified: Secondary | ICD-10-CM | POA: Diagnosis not present

## 2024-02-13 DIAGNOSIS — N83201 Unspecified ovarian cyst, right side: Secondary | ICD-10-CM | POA: Diagnosis not present

## 2024-02-13 DIAGNOSIS — C7889 Secondary malignant neoplasm of other digestive organs: Secondary | ICD-10-CM | POA: Diagnosis not present

## 2024-02-13 DIAGNOSIS — R599 Enlarged lymph nodes, unspecified: Secondary | ICD-10-CM | POA: Diagnosis not present

## 2024-02-13 MED ORDER — SODIUM CHLORIDE (PF) 0.9 % IJ SOLN
INTRAMUSCULAR | Status: AC
  Filled 2024-02-13: qty 50

## 2024-02-13 MED ORDER — IOHEXOL 300 MG/ML  SOLN
100.0000 mL | Freq: Once | INTRAMUSCULAR | Status: AC | PRN
  Administered 2024-02-13: 100 mL via INTRAVENOUS

## 2024-04-02 ENCOUNTER — Encounter: Payer: Self-pay | Admitting: Hematology

## 2024-04-02 ENCOUNTER — Telehealth: Payer: Self-pay | Admitting: Hematology

## 2024-04-06 ENCOUNTER — Inpatient Hospital Stay (HOSPITAL_BASED_OUTPATIENT_CLINIC_OR_DEPARTMENT_OTHER): Admitting: Hematology

## 2024-04-06 ENCOUNTER — Inpatient Hospital Stay

## 2024-04-06 ENCOUNTER — Inpatient Hospital Stay: Attending: Hematology

## 2024-04-06 VITALS — BP 98/68 | HR 74 | Temp 98.0°F | Resp 20 | Ht 67.0 in | Wt 163.7 lb

## 2024-04-06 DIAGNOSIS — R112 Nausea with vomiting, unspecified: Secondary | ICD-10-CM | POA: Insufficient documentation

## 2024-04-06 DIAGNOSIS — C7A019 Malignant carcinoid tumor of the small intestine, unspecified portion: Secondary | ICD-10-CM | POA: Diagnosis present

## 2024-04-06 DIAGNOSIS — Z9049 Acquired absence of other specified parts of digestive tract: Secondary | ICD-10-CM | POA: Diagnosis not present

## 2024-04-06 DIAGNOSIS — Z79899 Other long term (current) drug therapy: Secondary | ICD-10-CM | POA: Diagnosis not present

## 2024-04-06 DIAGNOSIS — C7A8 Other malignant neuroendocrine tumors: Secondary | ICD-10-CM | POA: Insufficient documentation

## 2024-04-06 DIAGNOSIS — C7B8 Other secondary neuroendocrine tumors: Secondary | ICD-10-CM | POA: Diagnosis not present

## 2024-04-06 DIAGNOSIS — R197 Diarrhea, unspecified: Secondary | ICD-10-CM | POA: Insufficient documentation

## 2024-04-06 DIAGNOSIS — R5383 Other fatigue: Secondary | ICD-10-CM | POA: Insufficient documentation

## 2024-04-06 DIAGNOSIS — C7A012 Malignant carcinoid tumor of the ileum: Secondary | ICD-10-CM

## 2024-04-06 DIAGNOSIS — R232 Flushing: Secondary | ICD-10-CM | POA: Diagnosis not present

## 2024-04-06 LAB — CMP (CANCER CENTER ONLY)
ALT: 13 U/L (ref 0–44)
AST: 12 U/L — ABNORMAL LOW (ref 15–41)
Albumin: 3.9 g/dL (ref 3.5–5.0)
Alkaline Phosphatase: 79 U/L (ref 38–126)
Anion gap: 7 (ref 5–15)
BUN: 9 mg/dL (ref 6–20)
CO2: 27 mmol/L (ref 22–32)
Calcium: 8.8 mg/dL — ABNORMAL LOW (ref 8.9–10.3)
Chloride: 106 mmol/L (ref 98–111)
Creatinine: 0.55 mg/dL (ref 0.44–1.00)
GFR, Estimated: >60 mL/min (ref >60)
Glucose, Bld: 107 mg/dL — ABNORMAL HIGH (ref 70–99)
Potassium: 4 mmol/L (ref 3.5–5.1)
Sodium: 140 mmol/L (ref 135–145)
Total Bilirubin: 0.5 mg/dL (ref 0.0–1.2)
Total Protein: 6.9 g/dL (ref 6.5–8.1)

## 2024-04-06 LAB — CBC WITH DIFFERENTIAL (CANCER CENTER ONLY)
Abs Immature Granulocytes: 0.01 10*3/uL (ref 0.00–0.07)
Basophils Absolute: 0.1 10*3/uL (ref 0.0–0.1)
Basophils Relative: 1 %
Eosinophils Absolute: 0.2 10*3/uL (ref 0.0–0.5)
Eosinophils Relative: 4 %
HCT: 35.3 % — ABNORMAL LOW (ref 36.0–46.0)
Hemoglobin: 11.7 g/dL — ABNORMAL LOW (ref 12.0–15.0)
Immature Granulocytes: 0 %
Lymphocytes Relative: 45 %
Lymphs Abs: 2.4 10*3/uL (ref 0.7–4.0)
MCH: 28.4 pg (ref 26.0–34.0)
MCHC: 33.1 g/dL (ref 30.0–36.0)
MCV: 85.7 fL (ref 80.0–100.0)
Monocytes Absolute: 0.3 10*3/uL (ref 0.1–1.0)
Monocytes Relative: 6 %
Neutro Abs: 2.4 10*3/uL (ref 1.7–7.7)
Neutrophils Relative %: 44 %
Platelet Count: 333 10*3/uL (ref 150–400)
RBC: 4.12 MIL/uL (ref 3.87–5.11)
RDW: 13.7 % (ref 11.5–15.5)
WBC Count: 5.4 10*3/uL (ref 4.0–10.5)
nRBC: 0 % (ref 0.0–0.2)

## 2024-04-06 LAB — FERRITIN: Ferritin: 25 ng/mL (ref 11–307)

## 2024-04-06 MED ORDER — OCTREOTIDE ACETATE 30 MG IM KIT
30.0000 mg | PACK | Freq: Once | INTRAMUSCULAR | Status: AC
  Administered 2024-04-06: 30 mg via INTRAMUSCULAR
  Filled 2024-04-06: qty 1

## 2024-04-06 NOTE — Assessment & Plan Note (Signed)
 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   -presented with upper abdominal pain with nausea and vomiting. CT 05/16/22 revealed: 2.9 cm mesenteric mass; multiple liver lesions; focal segment of hyperenhancing small bowel; findings suspicious for partial small bowel obstruction.  -S/p emergent resection on 05/18/22 by Dr. Hershell Lose. Small bowel mass pathology showed 1 cm well-differentiated NET, grade 1, infiltrating serosa, and an associated 3.2 cm mesenteric mass. Margins negative, 1/15 positive lymph nodes. Liver pathology confirmed metastatic NET. -Baseline chromogranin A on 05/17/22 elevated at 327.9, which has decreased on sandostatin   -24hr urine obtained 06/11/22 showed elevated 5-HIAA. -she began first-line sandostatin  injections 7/23, increased to full dose in 07/2022. -CT scan in September 2023 showed mild disease progression, she is clinically doing well. -restaging CT in 01/2024 showed stable disease

## 2024-04-06 NOTE — Progress Notes (Signed)
 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   -presented with upper abdominal pain with nausea and vomiting. CT 05/16/22 revealed: 2.9 cm mesenteric mass; multiple liver lesions; focal segment of hyperenhancing small bowel; findings suspicious for partial small bowel obstruction.  -S/p emergent resection on 05/18/22 by Linda Villanueva. Small bowel mass pathology showed 1 cm well-differentiated NET, grade 1, infiltrating serosa, and an associated 3.2 cm mesenteric mass. Margins negative, 1/15 positive lymph nodes. Liver pathology confirmed metastatic NET. -Baseline chromogranin A on 05/17/22 elevated at 327.9, which has decreased on sandostatin   -24hr urine obtained 06/11/22 showed elevated 5-HIAA. -she began first-line sandostatin  injections 7/23, increased to full dose in 07/2022. -CT scan in September 2023 showed mild disease progression, she is clinically doing well. -restaging CT in 01/2024 showed stable disease   Assessment & Plan Metastatic neuroendocrine tumor Metastatic neuroendocrine tumor with liver involvement. Primary tumor resected from the small lintestine, but multiple hepatic lesions persist. March CT scan indicated well-managed disease with a 3 cm index lesion and numerous smaller lesions. Surgery is not feasible due to diffuse hepatic lesions. Senostatin injections, previously halted due to miscommunication, are crucial for symptom control  and disease stabilization, though she does not significantly reduce tumor size. Consideration of oral or infusion therapies if disease progression occurs. - Restart senostatin injections immediately. - Schedule next injection in three weeks, prior to travel. - Consider MRI or PET scan as next staging scan to better review her metastatic disease, to see if she is a candidate for surgery.     Diarrhea related to neuroendocrine tumor Diarrhea has intensified over the past month, occurring postprandially with 5-6 watery bowel movements daily, exacerbated by stress. No hematochezia reported. Likely related to neuroendocrine tumor and cessation of senostatin injections. Imodium is recommended initially, with Lomedia or Zemaira as alternatives if necessary. - Initiate Imodium, 1-2 tablets in the morning and 1 tablet with each bowel movement. - Consider Lomedia or Zemaira if Imodium is inadequate. - Restart senostatin injections to aid symptom control.  Flushing related to neuroendocrine tumor Persistent facial flushing, likely related to neuroendocrine tumor and cessation of senostatin injections. Injections are anticipated to alleviate flushing. - Restart senostatin injections to alleviate flushing.  Fatigue related to neuroendocrine tumor Persistent fatigue, likely related to neuroendocrine tumor and cessation of senostatin injections. Injections are anticipated to improve fatigue. - Restart senostatin injections to improve fatigue.  Plan - Will restart the Sandostatin  injection today, and the next injection in 3 weeks (due to her international travel) - Lab, follow-up and injection around August 13, when she returns to [place] .   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 Multiple mildly dilated fluid-filled loops of mid small bowel, suspicious for partial small bowel obstruction, poorly defined transition point. Some mesenteric vascular congestion but no intramural air or bowel wall thickening. 2. 2.9 cm enhancing central mesenteric mass. 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 Focal segment of hyperenhancing small bowel could be potential source/site of primary. 3. Small moderate volume free fluid in the pelvis 4. Mildly prominent common bile duct which Linda Villanueva be correlated with LFTs.    05/17/2022 Imaging   CT CHEST WO CONTRAST  IMPRESSION: Indeterminate solid 3 mm right upper lobe pulmonary nodule.   Multiple liver masses best seen on recent contrast enhanced CT of the abdomen and pelvis.   05/17/2022 Tumor Marker   Patient's tumor was tested for the following markers: Chromogranin A. Results of the tumor marker test revealed elevation at 327.9.   05/18/2022 Procedure   POST-OPERATIVE DIAGNOSIS:   JEJUNAL MASS - PARTIALLY OBSTRUCTING LIVER MASSES PROBABLE METASTATIC CARCINOID    PROCEDURE:   EXPLORATORY LAPAROTOMY SMALL BOWEL RESECTION WEDGE LIVER BIOPSY   SURGEON:  Linda Goodie, MD   OR FINDINGS: 15 mm small bowel nodule at junction of jejunum and ileum causing partial obstruction.  Moderately bulky lymphadenopathy in the mesentery proximal to the nodule.  En bloc resection done.  230 cm of small intestine remaining from ligament of Treitz to ileocecal valve.  Numerous miliary 87mm-60 mm liver nodules.  Wedge resection done of right hepatic lobe just lateral to the gallbladder hepatic fossa.   Seems rather classic for small bowel carcinoid with  liver metastases.       05/18/2022 Pathology Results   SURGICAL PATHOLOGY  CASE: [id number]  PATIENT: Linda Villanueva  Surgical Pathology Report   FINAL MICROSCOPIC DIAGNOSIS:   A. SMALL BOWEL, MASS, RESECTION:  Well-differentiated neuroendocrine tumor, G1  Tumor measures 1.0 x 0.9 x 0.5 cm  Tumor infiltrates into the serosa (pT3)  Margins free  One of 15 lymph nodes with metastatic tumor and an associated mesenteric  mass measuring 3.2 cm (pN2) (see comment within template)  Perineural invasion present   B. LIVER, WEDGE RESECTION:  Metastatic well differentiated neuroendocrine tumor, 5 foci (ranging  from 7 mm to 0.35 mm)   Pathologic Stage Classification (pTNM, AJCC 8th Edition): pT3, pN2    05/18/2022 Cancer Staging   Staging form: Small Intestine - Other Histologies, AJCC 8th Edition - Pathologic stage from 05/18/2022: pT3, pN2, cM1 - Signed by Linda [place], MD on 07/11/2022 Histologic grade (G): G1 Histologic grading system: 4 grade system   06/07/2022 Initial Diagnosis   Malignant carcinoid tumor of unknown primary site [facility])   07/2023 Imaging   CT chest abdomen and pelvis with contrast   IMPRESSION: CT CHEST IMPRESSION  1.  No acute process or evidence of metastatic disease in the chest. 2. 3 mm right upper lobe pulmonary nodule is unchanged and considered benign.  CT ABDOMEN AND PELVIS IMPRESSION  1. Although the extent of liver metastasis is suboptimally evaluated given lack of arterial phase imaging, mild response to therapy is identified. 2. Surgical changes within the small bowel. Mild small bowel mesenteric adenopathy, similar. 3. Right ovarian corpus luteal cyst      Discussed the use of AI scribe software for clinical note transcription with the patient, who gave verbal consent to proceed.  History of Present Illness Linda Villanueva is a 53  year old female with metastatic neuroendocrine tumor who presents for follow-up. She is accompanied by her son.  She  experiences persistent diarrhea for the past month, occurring with any intake of food or drink, approximately five to six times daily, and exacerbated by stress. There is no bleeding associated with the diarrhea. She has not tried any medications for this issue.  She has persistent fatigue and facial flushing, which have not improved. Her previous monthly injections were stopped, with the last one administered in January.  Her medical history includes a neuroendocrine tumor surgically removed from her stomach, with multiple liver lesions remaining. A CT scan in March showed stable disease with the largest liver lesion measuring three centimeters.     All other systems were reviewed with the patient and are negative.  MEDICAL HISTORY:  Past Medical History:  Diagnosis Date   met neuroendocrine carcinoid tumor of ileum to liver 07/2022   Vitamin B12 deficiency    Vitamin D  deficiency     SURGICAL HISTORY: Past Surgical History:  Procedure Laterality Date   INNER EAR SURGERY     LAPAROTOMY N/A 05/18/2022   Procedure: EXPLORATORY LAPAROTOMY; SMALL BOWEL RESECTION; WEDGE LIVER BIOPSY;  Surgeon: Linda Champagne, MD;  Location: [facility];  Service: General;  Laterality: N/A;    I have reviewed the social history and family history with the patient and they are unchanged from previous note.  ALLERGIES:  has no known allergies.  MEDICATIONS:  Current Outpatient Medications  Medication Sig Dispense Refill   amLODipine  (NORVASC ) 2.5 MG tablet Take 1 tablet (2.5 mg total) by mouth at bedtime. 90 tablet 3   cholecalciferol (VITAMIN D3) 25 MCG (1000 UNIT) tablet Take 1,000 Units by mouth daily.     cyanocobalamin  (VITAMIN B12) 500 MCG tablet Take 500 mcg by mouth daily.     dicyclomine  (BENTYL ) 20 MG tablet Take 1 tablet (20 mg total) by mouth every 6 (six) hours. 30 tablet 1   octreotide  (SANDOSTATIN  LAR) 30 MG injection Inject 30 mg into the muscle every 28 (twenty-eight) days.     No current  facility-administered medications for this visit.    PHYSICAL EXAMINATION: ECOG PERFORMANCE STATUS: 1 - Symptomatic but completely ambulatory  Vitals:   04/06/24 1056  BP: 98/68  Pulse: 74  Resp: 20  Temp: 98 F (36.7 C)  SpO2: 99%   Wt Readings from Last 3 Encounters:  04/06/24 163 lb 11.2 oz (74.3 kg)  12/18/23 163 lb 4.8 oz (74.1 kg)  12/04/23 163 lb 2 oz (74 kg)     GENERAL:alert, no distress and comfortable SKIN: skin color, texture, turgor are normal, no rashes or significant lesions EYES: normal, Conjunctiva are pink and non-injected, sclera clear NECK: supple, thyroid  normal size, non-tender, without nodularity LYMPH:  no palpable lymphadenopathy in the cervical, axillary  LUNGS: clear to auscultation and percussion with normal breathing effort HEART: regular rate & rhythm and no murmurs and no lower extremity edema ABDOMEN:abdomen soft, non-tender and normal bowel sounds Musculoskeletal:no cyanosis of digits and no clubbing  NEURO: alert & oriented x 3 with fluent speech, no focal motor/sensory deficits  Physical Exam    LABORATORY DATA:  I have reviewed the data as listed    Latest Ref Rng & Units 04/06/2024   10:45 AM 12/18/2023    1:23 PM 11/14/2023    1:56 PM  CBC  WBC 4.0 - 10.5 K/uL 5.4  7.0  6.8   Hemoglobin 12.0 - 15.0 g/dL 16.1  09.6  12.6  Hematocrit 36.0 - 46.0 % 35.3  38.4  37.9   Platelets 150 - 400 K/uL 333  330  353         Latest Ref Rng & Units 04/06/2024   10:45 AM 12/18/2023    1:23 PM 11/14/2023    1:56 PM  CMP  Glucose 70 - 99 mg/dL 161  97  096   BUN 6 - 20 mg/dL 9  12  10    Creatinine 0.44 - 1.00 mg/dL 0.45  4.09  8.11   Sodium 135 - 145 mmol/L 140  138  139   Potassium 3.5 - 5.1 mmol/L 4.0  3.6  3.5   Chloride 98 - 111 mmol/L 106  105  104   CO2 22 - 32 mmol/L 27  25  30    Calcium  8.9 - 10.3 mg/dL 8.8  8.7  9.1   Total Protein 6.5 - 8.1 g/dL 6.9  7.2  7.3   Total Bilirubin 0.0 - 1.2 mg/dL 0.5  0.3  0.3   Alkaline Phos 38  - 126 U/L 79  85  79   AST 15 - 41 U/L 12  14  13    ALT 0 - 44 U/L 13  10  10        RADIOGRAPHIC STUDIES: I have personally reviewed the radiological images as listed and agreed with the findings in the report. No results found.    No orders of the defined types were placed in this encounter.  All questions were answered. The patient knows to call the clinic with any problems, questions or concerns. No barriers to learning was detected. The total time spent in the appointment was 30 minutes, including review of chart and various tests results, discussions about plan of care and coordination of care plan     Linda , MD 04/06/2024

## 2024-04-27 ENCOUNTER — Inpatient Hospital Stay: Attending: Hematology

## 2024-04-27 ENCOUNTER — Encounter: Payer: Self-pay | Admitting: *Deleted

## 2024-04-27 VITALS — BP 135/79 | HR 68 | Temp 98.1°F | Resp 20

## 2024-04-27 DIAGNOSIS — Z79899 Other long term (current) drug therapy: Secondary | ICD-10-CM | POA: Diagnosis not present

## 2024-04-27 DIAGNOSIS — C7A012 Malignant carcinoid tumor of the ileum: Secondary | ICD-10-CM

## 2024-04-27 DIAGNOSIS — C7A019 Malignant carcinoid tumor of the small intestine, unspecified portion: Secondary | ICD-10-CM | POA: Diagnosis present

## 2024-04-27 MED ORDER — OCTREOTIDE ACETATE 30 MG IM KIT
30.0000 mg | PACK | Freq: Once | INTRAMUSCULAR | Status: AC
  Administered 2024-04-27: 30 mg via INTRAMUSCULAR
  Filled 2024-04-27: qty 1

## 2024-06-29 ENCOUNTER — Other Ambulatory Visit: Payer: Self-pay

## 2024-06-29 DIAGNOSIS — C7A012 Malignant carcinoid tumor of the ileum: Secondary | ICD-10-CM

## 2024-06-29 NOTE — Assessment & Plan Note (Signed)
 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   -presented with upper abdominal pain with nausea and vomiting. CT 05/16/22 revealed: 2.9 cm mesenteric mass; multiple liver lesions; focal segment of hyperenhancing small bowel; findings suspicious for partial small bowel obstruction.  -S/p emergent resection on 05/18/22 by Dr. Sheldon. Small bowel mass pathology showed 1 cm well-differentiated NET, grade 1, infiltrating serosa, and an associated 3.2 cm mesenteric mass. Margins negative, 1/15 positive lymph nodes. Liver pathology confirmed metastatic NET. -Baseline chromogranin A on 05/17/22 elevated at 327.9, which has decreased on sandostatin   -24hr urine obtained 06/11/22 showed elevated 5-HIAA. -she began first-line sandostatin  injections 7/23, increased to full dose in 07/2022. -CT scan in September 2023 showed mild disease progression, she is clinically doing well. -restaging CT in 01/2024 showed stable disease

## 2024-06-30 ENCOUNTER — Encounter: Payer: Self-pay | Admitting: Hematology

## 2024-06-30 ENCOUNTER — Inpatient Hospital Stay (HOSPITAL_BASED_OUTPATIENT_CLINIC_OR_DEPARTMENT_OTHER): Admitting: Hematology

## 2024-06-30 ENCOUNTER — Inpatient Hospital Stay

## 2024-06-30 ENCOUNTER — Inpatient Hospital Stay: Attending: Hematology

## 2024-06-30 VITALS — BP 122/70 | HR 80 | Temp 97.4°F | Resp 18 | Ht 67.0 in | Wt 165.8 lb

## 2024-06-30 DIAGNOSIS — R197 Diarrhea, unspecified: Secondary | ICD-10-CM | POA: Diagnosis not present

## 2024-06-30 DIAGNOSIS — Z9049 Acquired absence of other specified parts of digestive tract: Secondary | ICD-10-CM | POA: Insufficient documentation

## 2024-06-30 DIAGNOSIS — Z79899 Other long term (current) drug therapy: Secondary | ICD-10-CM | POA: Diagnosis not present

## 2024-06-30 DIAGNOSIS — C7A012 Malignant carcinoid tumor of the ileum: Secondary | ICD-10-CM

## 2024-06-30 DIAGNOSIS — C7A019 Malignant carcinoid tumor of the small intestine, unspecified portion: Secondary | ICD-10-CM | POA: Diagnosis present

## 2024-06-30 DIAGNOSIS — R112 Nausea with vomiting, unspecified: Secondary | ICD-10-CM | POA: Insufficient documentation

## 2024-06-30 DIAGNOSIS — C7B8 Other secondary neuroendocrine tumors: Secondary | ICD-10-CM | POA: Diagnosis not present

## 2024-06-30 LAB — CBC WITH DIFFERENTIAL (CANCER CENTER ONLY)
Abs Immature Granulocytes: 0.03 K/uL (ref 0.00–0.07)
Basophils Absolute: 0.1 K/uL (ref 0.0–0.1)
Basophils Relative: 1 %
Eosinophils Absolute: 0.2 K/uL (ref 0.0–0.5)
Eosinophils Relative: 2 %
HCT: 34.3 % — ABNORMAL LOW (ref 36.0–46.0)
Hemoglobin: 11.5 g/dL — ABNORMAL LOW (ref 12.0–15.0)
Immature Granulocytes: 0 %
Lymphocytes Relative: 29 %
Lymphs Abs: 2.7 K/uL (ref 0.7–4.0)
MCH: 28.3 pg (ref 26.0–34.0)
MCHC: 33.5 g/dL (ref 30.0–36.0)
MCV: 84.5 fL (ref 80.0–100.0)
Monocytes Absolute: 0.6 K/uL (ref 0.1–1.0)
Monocytes Relative: 6 %
Neutro Abs: 5.9 K/uL (ref 1.7–7.7)
Neutrophils Relative %: 62 %
Platelet Count: 327 K/uL (ref 150–400)
RBC: 4.06 MIL/uL (ref 3.87–5.11)
RDW: 14.6 % (ref 11.5–15.5)
WBC Count: 9.4 K/uL (ref 4.0–10.5)
nRBC: 0 % (ref 0.0–0.2)

## 2024-06-30 LAB — CMP (CANCER CENTER ONLY)
ALT: 12 U/L (ref 0–44)
AST: 15 U/L (ref 15–41)
Albumin: 3.9 g/dL (ref 3.5–5.0)
Alkaline Phosphatase: 85 U/L (ref 38–126)
Anion gap: 7 (ref 5–15)
BUN: 11 mg/dL (ref 6–20)
CO2: 26 mmol/L (ref 22–32)
Calcium: 8.6 mg/dL — ABNORMAL LOW (ref 8.9–10.3)
Chloride: 105 mmol/L (ref 98–111)
Creatinine: 0.6 mg/dL (ref 0.44–1.00)
GFR, Estimated: >60 mL/min (ref >60)
Glucose, Bld: 116 mg/dL — ABNORMAL HIGH (ref 70–99)
Potassium: 4.5 mmol/L (ref 3.5–5.1)
Sodium: 138 mmol/L (ref 135–145)
Total Bilirubin: 0.3 mg/dL (ref 0.0–1.2)
Total Protein: 6.9 g/dL (ref 6.5–8.1)

## 2024-06-30 MED ORDER — OCTREOTIDE ACETATE 30 MG IM KIT
30.0000 mg | PACK | Freq: Once | INTRAMUSCULAR | Status: AC
  Administered 2024-06-30 (×2): 30 mg via INTRAMUSCULAR
  Filled 2024-06-30: qty 1

## 2024-06-30 NOTE — Progress Notes (Signed)
 Per request from Dr. Lanny, please fax referral over to Dr. Abran Quan office w/Surgical Oncology at [facility].  Referral packet sent via fax.  Fax confirmation received.

## 2024-06-30 NOTE — Progress Notes (Signed)
 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   -presented with upper abdominal pain with nausea and vomiting. CT 05/16/22 revealed: 2.9 cm mesenteric mass; multiple liver lesions; focal segment of hyperenhancing small bowel; findings suspicious for partial small bowel obstruction.  -S/p emergent resection on 05/18/22 by Dr. Sheldon. Small bowel mass pathology showed 1 cm well-differentiated NET, grade 1, infiltrating serosa, and an associated 3.2 cm mesenteric mass. Margins negative, 1/15 positive lymph nodes. Liver pathology confirmed metastatic NET. -Baseline chromogranin A on 05/17/22 elevated at 327.9, which has decreased on sandostatin   -24hr urine obtained 06/11/22 showed elevated 5-HIAA. -she began first-line sandostatin  injections 7/23, increased to full dose in 07/2022. -CT scan in September 2023 showed mild disease progression, she is clinically doing well. 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 The primary tumor in the small bowel was resected in July 2023, along with some lymph nodes. Liver metastases are unresectable due to her number and location. The tumor is malignant with potential for further spread. The condition is not curable but treatable, aiming to  stabilize the disease and manage symptoms. Injections control symptoms but do not significantly reduce tumor size. Surgery is a potential option, though not guaranteed due to multiple liver lesions. She understands the malignancy [name] be life-limiting but is interested in surgical options. - Continue injections to manage symptoms and stabilize the disease. - Refer to Dr. Debora at [facility] for surgical evaluation of liver metastases. - Continue scans every six months to monitor disease progression.  Diarrhea secondary to metastatic neuroendocrine tumor Diarrhea secondary to the metastatic neuroendocrine tumor has improved since the last visit, likely due to bowel cramps from the carcinoid tumor. - Use Tylenol  for pain management as needed.  Plan - She is clinically doing well, we will proceed with Sandostatin  injection today and continue every months - Will refer her to Dr. 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 Multiple mildly dilated fluid-filled loops of mid small bowel, suspicious for partial small bowel obstruction, poorly defined transition point. Some mesenteric vascular congestion but no intramural air or bowel wall thickening. 2. 2.9 cm enhancing central mesenteric mass. 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 Focal  segment of hyperenhancing  small bowel could be potential source/site of primary. 3. Small moderate volume free fluid in the pelvis 4. Mildly prominent common bile duct which [name] be correlated with LFTs.    05/17/2022 Imaging   CT CHEST WO CONTRAST  IMPRESSION: Indeterminate solid 3 mm right upper lobe pulmonary nodule.   Multiple liver masses best seen on recent contrast enhanced CT of the abdomen and pelvis.   05/17/2022 Tumor Marker   Patient's tumor was tested for the following markers: Chromogranin A. Results of the tumor marker test revealed elevation at 327.9.   05/18/2022 Procedure   POST-OPERATIVE DIAGNOSIS:   JEJUNAL MASS - PARTIALLY OBSTRUCTING LIVER MASSES PROBABLE METASTATIC CARCINOID    PROCEDURE:   EXPLORATORY LAPAROTOMY SMALL BOWEL RESECTION WEDGE LIVER BIOPSY   SURGEON:  Elspeth KYM Schultze, MD   OR FINDINGS: 15 mm small bowel nodule at junction of jejunum and ileum causing partial obstruction.  Moderately bulky lymphadenopathy in the mesentery proximal to the nodule.  En bloc resection done.  230 cm of small intestine remaining from ligament of Treitz to ileocecal valve.  Numerous miliary 54mm-60 mm liver nodules.  Wedge resection done of right hepatic lobe just lateral to the gallbladder hepatic fossa.   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       05/18/2022 Pathology Results   SURGICAL PATHOLOGY  CASE: [id number]  PATIENT: Linda Villanueva  Surgical Pathology Report   FINAL MICROSCOPIC DIAGNOSIS:   A. SMALL BOWEL, MASS, RESECTION:  Well-differentiated neuroendocrine tumor, G1  Tumor measures 1.0 x 0.9 x 0.5 cm  Tumor infiltrates into the serosa (pT3)  Margins free  One of 15 lymph nodes with metastatic tumor and an associated mesenteric  mass measuring 3.2 cm (pN2) (see comment within template)  Perineural invasion present   B. LIVER, WEDGE RESECTION:  Metastatic well differentiated neuroendocrine tumor, 5 foci (ranging   from 7 mm to 0.35 mm)   Pathologic Stage Classification (pTNM, AJCC 8th Edition): pT3, pN2    05/18/2022 Cancer Staging   Staging form: Small Intestine - Other Histologies, AJCC 8th Edition - Pathologic stage from 05/18/2022: pT3, pN2, cM1 - Signed by Lanny Callander, MD on 07/11/2022 Histologic grade (G): G1 Histologic grading system: 4 grade system   06/07/2022 Initial Diagnosis   Malignant carcinoid tumor of unknown primary site [facility])   07/2023 Imaging   CT chest abdomen and pelvis with contrast   IMPRESSION: CT CHEST IMPRESSION  1.  No acute process or evidence of metastatic disease in the chest. 2. 3 mm right upper lobe pulmonary nodule is unchanged and considered benign.  CT ABDOMEN AND PELVIS IMPRESSION  1. Although the extent of liver metastasis is suboptimally evaluated given lack of arterial phase imaging, mild response to therapy is identified. 2. Surgical changes within the small bowel. Mild small bowel mesenteric adenopathy, similar. 3. Right ovarian corpus luteal cyst      Discussed the use of AI scribe software for clinical note transcription with the patient, who gave verbal consent to proceed.  History of Present Illness Linda Villanueva is a 53 year old female with metastatic neuroendocrine tumor who presents for follow-up.  She has metastases to the liver and a mesentery lymph node. The primary tumor in the small bowel was surgically removed in July 2023, along with some lymph nodes. A liver biopsy confirmed metastasis in the liver. She is currently receiving injections in her arm to manage symptoms.  Her diarrhea has improved significantly since the last visit. She  occasionally experiences intermittent abdominal cramping, present since July. No new symptoms have been reported in the past three months.     All other systems were reviewed with the patient and are negative.  MEDICAL HISTORY:  Past Medical History:  Diagnosis Date   met neuroendocrine carcinoid tumor  of ileum to liver 07/2022   Vitamin B12 deficiency    Vitamin D  deficiency     SURGICAL HISTORY: Past Surgical History:  Procedure Laterality Date   INNER EAR SURGERY     LAPAROTOMY N/A 05/18/2022   Procedure: EXPLORATORY LAPAROTOMY; SMALL BOWEL RESECTION; WEDGE LIVER BIOPSY;  Surgeon: Sheldon Standing, MD;  Location: [facility];  Service: General;  Laterality: N/A;    I have reviewed the social history and family history with the patient and they are unchanged from previous note.  ALLERGIES:  has no known allergies.  MEDICATIONS:  Current Outpatient Medications  Medication Sig Dispense Refill   amLODipine  (NORVASC ) 2.5 MG tablet Take 1 tablet (2.5 mg total) by mouth at bedtime. 90 tablet 3   cholecalciferol (VITAMIN D3) 25 MCG (1000 UNIT) tablet Take 1,000 Units by mouth daily.     cyanocobalamin  (VITAMIN B12) 500 MCG tablet Take 500 mcg by mouth daily.     dicyclomine  (BENTYL ) 20 MG tablet Take 1 tablet (20 mg total) by mouth every 6 (six) hours. 30 tablet 1   octreotide  (SANDOSTATIN  LAR) 30 MG injection Inject 30 mg into the muscle every 28 (twenty-eight) days.     No current facility-administered medications for this visit.    PHYSICAL EXAMINATION: ECOG PERFORMANCE STATUS: 1 - Symptomatic but completely ambulatory  Vitals:   06/30/24 1153  BP: 122/70  Pulse: 80  Resp: 18  Temp: (!) 97.4 F (36.3 C)  SpO2: 100%   Wt Readings from Last 3 Encounters:  06/30/24 165 lb 12.8 oz (75.2 kg)  04/06/24 163 lb 11.2 oz (74.3 kg)  12/18/23 163 lb 4.8 oz (74.1 kg)     GENERAL:alert, no distress and comfortable SKIN: skin color, texture, turgor are normal, no rashes or significant lesions EYES: normal, Conjunctiva are pink and non-injected, sclera clear Musculoskeletal:no cyanosis of digits and no clubbing  NEURO: alert & oriented x 3 with fluent speech, no focal motor/sensory deficits  Physical Exam    LABORATORY DATA:  I have reviewed the data as listed    Latest Ref Rng &  Units 06/30/2024   11:26 AM 04/06/2024   10:45 AM 12/18/2023    1:23 PM  CBC  WBC 4.0 - 10.5 K/uL 9.4  5.4  7.0   Hemoglobin 12.0 - 15.0 g/dL 88.4  88.2  87.3   Hematocrit 36.0 - 46.0 % 34.3  35.3  38.4   Platelets 150 - 400 K/uL 327  333  330         Latest Ref Rng & Units 06/30/2024   11:26 AM 04/06/2024   10:45 AM 12/18/2023    1:23 PM  CMP  Glucose 70 - 99 mg/dL 883  892  97   BUN 6 - 20 mg/dL 11  9  12    Creatinine 0.44 - 1.00 mg/dL 9.39  9.44  9.46   Sodium 135 - 145 mmol/L 138  140  138   Potassium 3.5 - 5.1 mmol/L 4.5  4.0  3.6   Chloride 98 - 111 mmol/L 105  106  105   CO2 22 - 32 mmol/L 26  27  25    Calcium  8.9 - 10.3 mg/dL 8.6  8.8  8.7   Total Protein 6.5 - 8.1 g/dL 6.9  6.9  7.2   Total Bilirubin 0.0 - 1.2 mg/dL 0.3  0.5  0.3   Alkaline Phos 38 - 126 U/L 85  79  85   AST 15 - 41 U/L 15  12  14    ALT 0 - 44 U/L 12  13  10        RADIOGRAPHIC STUDIES: I have personally reviewed the radiological images as listed and agreed with the findings in the report. No results found.    Orders Placed This Encounter  Procedures   Ambulatory referral to General Surgery    Referral Priority:   Routine    Referral Type:   Surgical    Referral Reason:   Specialty Services Required    Requested Specialty:   General Surgery    Number of Visits Requested:   1   All questions were answered. The patient knows to call the clinic with any problems, questions or concerns. No barriers to learning was detected. The total time spent in the appointment was 30 minutes, including review of chart and various tests results, discussions about plan of care and coordination of care plan     Onita Mattock, MD 06/30/2024

## 2024-07-03 DIAGNOSIS — K7689 Other specified diseases of liver: Secondary | ICD-10-CM | POA: Diagnosis not present

## 2024-07-03 DIAGNOSIS — R1011 Right upper quadrant pain: Secondary | ICD-10-CM | POA: Diagnosis not present

## 2024-07-22 ENCOUNTER — Telehealth: Payer: Self-pay | Admitting: *Deleted

## 2024-07-22 NOTE — Telephone Encounter (Signed)
 Warda Norval Loop, DOB: 1971-10-06,[phone] (home) in this form nurse office. [employer] Financial Aid office needs a letter that reads I had surgery July, 1, 2023.  My daughter is a Consulting civil engineer.  She told them in 2023 she was unable to pay her student loan of $3000.00 as I was unable to work.  They said nothing last year and now want $4000, plus a $700.00 payment for this year.  We cannot pay this..  Do not know where to go for help.  Obtained HIPAA Authorization.  Informed me it is due today when advised form staff does not create letters the collaborative and provider do.  Will ask and call you however should ask surgeon who performed procedure.  Attempted to reach provider currently in clinic around [phone]. Informed collaborative about need for a letter.  Notified patient unable to reach provider.  Programmer, applications and the Department of Education for transfer, new loan or assistance for the deferred 2023 loan .  Also Iridiana share patient portal information if she desires with

## 2024-07-28 ENCOUNTER — Inpatient Hospital Stay: Attending: Hematology

## 2024-07-28 VITALS — BP 119/77 | HR 79 | Temp 98.3°F | Resp 15

## 2024-07-28 DIAGNOSIS — Z79899 Other long term (current) drug therapy: Secondary | ICD-10-CM | POA: Insufficient documentation

## 2024-07-28 DIAGNOSIS — C7A012 Malignant carcinoid tumor of the ileum: Secondary | ICD-10-CM

## 2024-07-28 DIAGNOSIS — C7A019 Malignant carcinoid tumor of the small intestine, unspecified portion: Secondary | ICD-10-CM | POA: Insufficient documentation

## 2024-07-28 MED ORDER — OCTREOTIDE ACETATE 30 MG IM KIT
30.0000 mg | PACK | Freq: Once | INTRAMUSCULAR | Status: AC
  Administered 2024-07-28: 30 mg via INTRAMUSCULAR
  Filled 2024-07-28: qty 1

## 2024-07-30 DIAGNOSIS — K8050 Calculus of bile duct without cholangitis or cholecystitis without obstruction: Secondary | ICD-10-CM | POA: Diagnosis not present

## 2024-07-30 DIAGNOSIS — D3A8 Other benign neuroendocrine tumors: Secondary | ICD-10-CM | POA: Diagnosis not present

## 2024-07-30 DIAGNOSIS — C787 Secondary malignant neoplasm of liver and intrahepatic bile duct: Secondary | ICD-10-CM | POA: Diagnosis not present

## 2024-08-13 DIAGNOSIS — D3A8 Other benign neuroendocrine tumors: Secondary | ICD-10-CM | POA: Diagnosis not present

## 2024-08-13 DIAGNOSIS — C787 Secondary malignant neoplasm of liver and intrahepatic bile duct: Secondary | ICD-10-CM | POA: Diagnosis not present

## 2024-08-25 ENCOUNTER — Inpatient Hospital Stay

## 2024-08-26 ENCOUNTER — Inpatient Hospital Stay: Attending: Hematology

## 2024-08-26 DIAGNOSIS — C7A012 Malignant carcinoid tumor of the ileum: Secondary | ICD-10-CM

## 2024-08-26 DIAGNOSIS — C7A019 Malignant carcinoid tumor of the small intestine, unspecified portion: Secondary | ICD-10-CM | POA: Diagnosis present

## 2024-08-26 DIAGNOSIS — Z79899 Other long term (current) drug therapy: Secondary | ICD-10-CM | POA: Insufficient documentation

## 2024-08-26 MED ORDER — OCTREOTIDE ACETATE 30 MG IM KIT
30.0000 mg | PACK | Freq: Once | INTRAMUSCULAR | Status: AC
  Administered 2024-08-26: 30 mg via INTRAMUSCULAR
  Filled 2024-08-26: qty 1

## 2024-09-09 ENCOUNTER — Inpatient Hospital Stay

## 2024-09-09 DIAGNOSIS — C7A012 Malignant carcinoid tumor of the ileum: Secondary | ICD-10-CM

## 2024-09-09 DIAGNOSIS — C7A019 Malignant carcinoid tumor of the small intestine, unspecified portion: Secondary | ICD-10-CM | POA: Diagnosis not present

## 2024-09-09 LAB — CBC WITH DIFFERENTIAL (CANCER CENTER ONLY)
Abs Immature Granulocytes: 0.02 K/uL (ref 0.00–0.07)
Basophils Absolute: 0.1 K/uL (ref 0.0–0.1)
Basophils Relative: 1 %
Eosinophils Absolute: 0.1 K/uL (ref 0.0–0.5)
Eosinophils Relative: 1 %
HCT: 36.7 % (ref 36.0–46.0)
Hemoglobin: 12.3 g/dL (ref 12.0–15.0)
Immature Granulocytes: 0 %
Lymphocytes Relative: 45 %
Lymphs Abs: 3.3 K/uL (ref 0.7–4.0)
MCH: 28.3 pg (ref 26.0–34.0)
MCHC: 33.5 g/dL (ref 30.0–36.0)
MCV: 84.4 fL (ref 80.0–100.0)
Monocytes Absolute: 0.5 K/uL (ref 0.1–1.0)
Monocytes Relative: 7 %
Neutro Abs: 3.4 K/uL (ref 1.7–7.7)
Neutrophils Relative %: 46 %
Platelet Count: 351 K/uL (ref 150–400)
RBC: 4.35 MIL/uL (ref 3.87–5.11)
RDW: 14 % (ref 11.5–15.5)
WBC Count: 7.4 K/uL (ref 4.0–10.5)
nRBC: 0 % (ref 0.0–0.2)

## 2024-09-09 LAB — CMP (CANCER CENTER ONLY)
ALT: 10 U/L (ref 0–44)
AST: 12 U/L — ABNORMAL LOW (ref 15–41)
Albumin: 4.1 g/dL (ref 3.5–5.0)
Alkaline Phosphatase: 82 U/L (ref 38–126)
Anion gap: 6 (ref 5–15)
BUN: 14 mg/dL (ref 6–20)
CO2: 26 mmol/L (ref 22–32)
Calcium: 9.1 mg/dL (ref 8.9–10.3)
Chloride: 107 mmol/L (ref 98–111)
Creatinine: 0.55 mg/dL (ref 0.44–1.00)
GFR, Estimated: >60 mL/min (ref >60)
Glucose, Bld: 94 mg/dL (ref 70–99)
Potassium: 4 mmol/L (ref 3.5–5.1)
Sodium: 139 mmol/L (ref 135–145)
Total Bilirubin: 0.3 mg/dL (ref 0.0–1.2)
Total Protein: 7.3 g/dL (ref 6.5–8.1)

## 2024-09-15 ENCOUNTER — Inpatient Hospital Stay

## 2024-09-21 NOTE — Assessment & Plan Note (Signed)
 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   -presented with upper abdominal pain with nausea and vomiting. CT 05/16/22 revealed: 2.9 cm mesenteric mass; multiple liver lesions; focal segment of hyperenhancing small bowel; findings suspicious for partial small bowel obstruction.  -S/p emergent resection on 05/18/22 by Dr. Sheldon. Small bowel mass pathology showed 1 cm well-differentiated NET, grade 1, infiltrating serosa, and an associated 3.2 cm mesenteric mass. Margins negative, 1/15 positive lymph nodes. Liver pathology confirmed metastatic NET. -Baseline chromogranin A on 05/17/22 elevated at 327.9, which has decreased on sandostatin   -24hr urine obtained 06/11/22 showed elevated 5-HIAA. -she began first-line sandostatin  injections 7/23, increased to full dose in 07/2022. -CT scan in September 2023 showed mild disease progression, she is clinically doing well. -restaging CT in 01/2024 showed stable disease

## 2024-09-22 ENCOUNTER — Inpatient Hospital Stay

## 2024-09-22 ENCOUNTER — Other Ambulatory Visit: Payer: Self-pay

## 2024-09-22 ENCOUNTER — Inpatient Hospital Stay
Admission: RE | Admit: 2024-09-22 | Discharge: 2024-09-22 | Disposition: A | Discharge status: Home / Self Care | Payer: Self-pay | Source: Ambulatory Visit | Attending: Hematology | Admitting: Hematology

## 2024-09-22 ENCOUNTER — Inpatient Hospital Stay: Attending: Hematology | Admitting: Hematology

## 2024-09-22 VITALS — BP 110/70 | HR 89 | Temp 98.4°F | Resp 17 | Wt 166.8 lb

## 2024-09-22 DIAGNOSIS — K769 Liver disease, unspecified: Secondary | ICD-10-CM | POA: Insufficient documentation

## 2024-09-22 DIAGNOSIS — Z9049 Acquired absence of other specified parts of digestive tract: Secondary | ICD-10-CM | POA: Diagnosis not present

## 2024-09-22 DIAGNOSIS — R1011 Right upper quadrant pain: Secondary | ICD-10-CM | POA: Diagnosis not present

## 2024-09-22 DIAGNOSIS — R112 Nausea with vomiting, unspecified: Secondary | ICD-10-CM | POA: Insufficient documentation

## 2024-09-22 DIAGNOSIS — N8311 Corpus luteum cyst of right ovary: Secondary | ICD-10-CM | POA: Diagnosis not present

## 2024-09-22 DIAGNOSIS — D3A00 Benign carcinoid tumor of unspecified site: Secondary | ICD-10-CM | POA: Diagnosis not present

## 2024-09-22 DIAGNOSIS — C7A012 Malignant carcinoid tumor of the ileum: Secondary | ICD-10-CM

## 2024-09-22 DIAGNOSIS — R188 Other ascites: Secondary | ICD-10-CM | POA: Diagnosis not present

## 2024-09-22 DIAGNOSIS — C7A019 Malignant carcinoid tumor of the small intestine, unspecified portion: Secondary | ICD-10-CM | POA: Diagnosis present

## 2024-09-22 DIAGNOSIS — R160 Hepatomegaly, not elsewhere classified: Secondary | ICD-10-CM

## 2024-09-22 DIAGNOSIS — Z79899 Other long term (current) drug therapy: Secondary | ICD-10-CM | POA: Insufficient documentation

## 2024-09-22 MED ORDER — OCTREOTIDE ACETATE 30 MG IM KIT
30.0000 mg | PACK | Freq: Once | INTRAMUSCULAR | Status: AC
  Administered 2024-09-22: 30 mg via INTRAMUSCULAR
  Filled 2024-09-22: qty 1

## 2024-09-22 NOTE — Progress Notes (Signed)
 Verbal order w/readback from Dr. Lanny for Lutathera treatment by Dr. Malva.  Order placed and NM plus Dr. Malva notified.  Sent email to [employer] requesting Pt's PET Scan & MRI from [facility].  Order requesting Outside Films placed in [facility] for both test.

## 2024-09-22 NOTE — Progress Notes (Signed)
 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   -presented with upper abdominal pain with nausea and vomiting. CT 05/16/22 revealed: 2.9 cm mesenteric mass; multiple liver lesions; focal segment of hyperenhancing small bowel; findings suspicious for partial small bowel obstruction.  -S/p emergent resection on 05/18/22 by Dr. Sheldon. Small bowel mass pathology showed 1 cm well-differentiated NET, grade 1, infiltrating serosa, and an associated 3.2 cm mesenteric mass. Margins negative, 1/15 positive lymph nodes. Liver pathology confirmed metastatic NET. -Baseline chromogranin A on 05/17/22 elevated at 327.9, which has decreased on sandostatin   -24hr urine obtained 06/11/22 showed elevated 5-HIAA. -she began first-line sandostatin  injections 7/23, increased to full dose in 07/2022. -CT scan in September 2023 showed mild disease progression, she is clinically doing well. 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 Surgery is not an option due to the high number of liver lesions. The tumor is slow-growing and not immediately life-threatening.  Current treatment with somatostatin analogs is helping manage symptoms.  -Alternative liver-directed therapies such as radioembolization and Lutathera are considered for further management. Lutathera is administered every two months for four treatments, with potential side effects including nausea and fatigue, low blood counts, small risk of MDS etc. -She has developed a right upper quadrant pain in the past few months, likely related to her liver metastasis.  I will refer her to Dr. Malva for Lutathera treatment - Continue somatostatin analog injections.   Chronic right upper quadrant abdominal pain likely related to metastatic neuroendocrine tumor Chronic right upper quadrant abdominal pain has been present for three months, likely related to metastatic neuroendocrine tumor in the liver. Pain is less severe than before, rated 2-3 out of 10, and is manageable. Pain management is supported by current somatostatin treatment. - Continue current pain management with somatostatin injections. - Monitor pain levels and adjust treatment as necessary.   Plan - I reviewed her recent abdominal MRI and dotatate PET scan at [facility] and discussed the findings with patient.  She unfortunately not a candidate for liver surgery due to the extensive liver metastasis. - Due to her right upper quadrant abdominal pain, I will refer her to nuclear medicine Dr. Malva for Lutathera treatment - Will continue Sandostatin  injection monthly - Follow-up in 3 months - I communicated with Dr. Debora today.  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 Multiple mildly dilated fluid-filled loops of mid small bowel, suspicious for partial small bowel obstruction, poorly defined transition point. Some mesenteric vascular congestion but no intramural air or bowel wall thickening. 2. 2.9 cm enhancing central mesenteric mass. 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 Focal segment of hyperenhancing small bowel could be potential source/site of primary. 3. Small moderate volume free fluid in the pelvis 4. Mildly prominent common bile duct which Natarsha be correlated with LFTs.    05/17/2022 Imaging   CT CHEST WO CONTRAST  IMPRESSION: Indeterminate solid 3 mm right upper lobe pulmonary nodule.   Multiple liver masses best seen on recent contrast enhanced CT of the abdomen and pelvis.   05/17/2022 Tumor Marker   Patient's tumor was tested for the following markers: Chromogranin A. Results of the tumor marker test revealed elevation at 327.9.   05/18/2022 Procedure   POST-OPERATIVE DIAGNOSIS:   JEJUNAL MASS - PARTIALLY OBSTRUCTING LIVER MASSES PROBABLE METASTATIC CARCINOID    PROCEDURE:   EXPLORATORY LAPAROTOMY SMALL BOWEL RESECTION WEDGE LIVER BIOPSY   SURGEON:  Elspeth KYM Schultze, MD   OR FINDINGS: 15 mm small bowel nodule at junction of jejunum and ileum causing partial obstruction.  Moderately bulky lymphadenopathy in the mesentery proximal to the nodule.  En bloc resection done.  230 cm of small intestine remaining from ligament of Treitz to ileocecal valve.  Numerous miliary 80mm-60 mm liver nodules.  Wedge resection done of right hepatic lobe just lateral to the gallbladder hepatic fossa.   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       05/18/2022 Pathology Results   SURGICAL PATHOLOGY  CASE: [id number]  PATIENT: Linda Villanueva   Surgical Pathology Report   FINAL MICROSCOPIC DIAGNOSIS:   A. SMALL BOWEL, MASS, RESECTION:  Well-differentiated neuroendocrine tumor, G1  Tumor measures 1.0 x 0.9 x 0.5 cm  Tumor infiltrates into the serosa (pT3)  Margins free  One of 15 lymph nodes with metastatic tumor and an associated mesenteric  mass measuring 3.2 cm (pN2) (see comment within template)  Perineural invasion present   B. LIVER, WEDGE RESECTION:  Metastatic well differentiated neuroendocrine tumor, 5 foci (ranging  from 7 mm to 0.35 mm)   Pathologic Stage Classification (pTNM, AJCC 8th Edition): pT3, pN2    05/18/2022 Cancer Staging   Staging form: Small Intestine - Other Histologies, AJCC 8th Edition - Pathologic stage from 05/18/2022: pT3, pN2, cM1 - Signed by Lanny Callander, MD on 07/11/2022 Histologic grade (G): G1 Histologic grading system: 4 grade system   06/07/2022 Initial Diagnosis   Malignant carcinoid tumor of unknown primary site [facility])   07/2023 Imaging   CT chest abdomen and pelvis with contrast   IMPRESSION: CT CHEST IMPRESSION  1.  No acute process or evidence of metastatic disease in the chest. 2. 3 mm right upper lobe pulmonary nodule is unchanged and considered benign.  CT ABDOMEN AND PELVIS IMPRESSION  1. Although the extent of liver metastasis is suboptimally evaluated given lack of arterial phase imaging, mild response to therapy is identified. 2. Surgical changes within the small bowel. Mild small bowel mesenteric adenopathy, similar. 3. Right ovarian corpus luteal cyst      Discussed the use of AI scribe software for clinical note transcription with the patient, who gave verbal consent to proceed.  History of Present Illness Cassandra Danyla Wattley is a 53 year  old female with a neuroendocrine tumor who presents for follow-up.  She experiences right upper quadrant abdominal pain, which has improved over the past three months and is now rated as 2 to 3 out of 10. The pain began in July 2025,  two years after her small bowel surgery, and was initially severe and intermittent.  She has a neuroendocrine tumor with metastasis to the liver and lymph nodes. Recent imaging, including an MRI and PET scan, showed extensive disease in the liver with numerous lesions. She receives Sandostatin  injections, which help manage her symptoms, including flushing and abdominal pain.  She experiences facial flushing, often triggered by stress or nervousness, characterized by her face turning red and sometimes feeling hot.     All other systems were reviewed with the patient and are negative.  MEDICAL HISTORY:  Past Medical History:  Diagnosis Date   met neuroendocrine carcinoid tumor of ileum to liver 07/2022   Vitamin B12 deficiency    Vitamin D  deficiency     SURGICAL HISTORY: Past Surgical History:  Procedure Laterality Date   INNER EAR SURGERY     LAPAROTOMY N/A 05/18/2022   Procedure: EXPLORATORY LAPAROTOMY; SMALL BOWEL RESECTION; WEDGE LIVER BIOPSY;  Surgeon: Sheldon Standing, MD;  Location: [facility];  Service: General;  Laterality: N/A;    I have reviewed the social history and family history with the patient and they are unchanged from previous note.  ALLERGIES:  has no known allergies.  MEDICATIONS:  Current Outpatient Medications  Medication Sig Dispense Refill   amLODipine  (NORVASC ) 2.5 MG tablet Take 1 tablet (2.5 mg total) by mouth at bedtime. 90 tablet 3   cholecalciferol (VITAMIN D3) 25 MCG (1000 UNIT) tablet Take 1,000 Units by mouth daily.     cyanocobalamin  (VITAMIN B12) 500 MCG tablet Take 500 mcg by mouth daily.     dicyclomine  (BENTYL ) 20 MG tablet Take 1 tablet (20 mg total) by mouth every 6 (six) hours. 30 tablet 1   octreotide  (SANDOSTATIN  LAR) 30 MG injection Inject 30 mg into the muscle every 28 (twenty-eight) days.     No current facility-administered medications for this visit.    PHYSICAL EXAMINATION: ECOG PERFORMANCE STATUS: 1 - Symptomatic but completely  ambulatory  Vitals:   09/22/24 1147  BP: 110/70  Pulse: 89  Resp: 17  Temp: 98.4 F (36.9 C)  SpO2: 98%   Wt Readings from Last 3 Encounters:  09/22/24 166 lb 12.8 oz (75.7 kg)  06/30/24 165 lb 12.8 oz (75.2 kg)  04/06/24 163 lb 11.2 oz (74.3 kg)     GENERAL:alert, no distress and comfortable SKIN: skin color, texture, turgor are normal, no rashes or significant lesions EYES: normal, Conjunctiva are pink and non-injected, sclera clear NECK: supple, thyroid  normal size, non-tender, without nodularity LYMPH:  no palpable lymphadenopathy in the cervical, axillary  LUNGS: clear to auscultation and percussion with normal breathing effort HEART: regular rate & rhythm and no murmurs and no lower extremity edema ABDOMEN:abdomen soft, non-tender and normal bowel sounds Musculoskeletal:no cyanosis of digits and no clubbing  NEURO: alert & oriented x 3 with fluent speech, no focal motor/sensory deficits  Physical Exam    LABORATORY DATA:  I have reviewed the data as listed    Latest Ref Rng & Units 09/09/2024    1:45 PM 06/30/2024   11:26 AM 04/06/2024   10:45 AM  CBC  WBC 4.0 - 10.5 K/uL 7.4  9.4  5.4   Hemoglobin 12.0 - 15.0 g/dL 87.6  11.5  11.7   Hematocrit 36.0 - 46.0 % 36.7  34.3  35.3   Platelets 150 - 400 K/uL 351  327  333         Latest Ref Rng & Units 09/09/2024    1:45 PM 06/30/2024   11:26 AM 04/06/2024   10:45 AM  CMP  Glucose 70 - 99 mg/dL 94  883  892   BUN 6 - 20 mg/dL 14  11  9    Creatinine 0.44 - 1.00 mg/dL 9.44  9.39  9.44   Sodium 135 - 145 mmol/L 139  138  140   Potassium 3.5 - 5.1 mmol/L 4.0  4.5  4.0   Chloride 98 - 111 mmol/L 107  105  106   CO2 22 - 32 mmol/L 26  26  27    Calcium  8.9 - 10.3 mg/dL 9.1  8.6  8.8   Total Protein 6.5 - 8.1 g/dL 7.3  6.9  6.9   Total Bilirubin 0.0 - 1.2 mg/dL 0.3  0.3  0.5   Alkaline Phos 38 - 126 U/L 82  85  79   AST 15 - 41 U/L 12  15  12    ALT 0 - 44 U/L 10  12  13        RADIOGRAPHIC STUDIES: I have  personally reviewed the radiological images as listed and agreed with the findings in the report. No results found.    No orders of the defined types were placed in this encounter.  All questions were answered. The patient knows to call the clinic with any problems, questions or concerns. No barriers to learning was detected. The total time spent in the appointment was 40 minutes, including review of chart and various tests results, discussions about plan of care and coordination of care plan     Onita Mattock, MD 09/22/2024

## 2024-09-23 ENCOUNTER — Other Ambulatory Visit: Payer: Self-pay | Admitting: Hematology

## 2024-09-23 ENCOUNTER — Encounter (HOSPITAL_COMMUNITY): Payer: Self-pay

## 2024-09-23 DIAGNOSIS — R160 Hepatomegaly, not elsewhere classified: Secondary | ICD-10-CM

## 2024-09-23 DIAGNOSIS — C7A012 Malignant carcinoid tumor of the ileum: Secondary | ICD-10-CM

## 2024-09-28 ENCOUNTER — Encounter (HOSPITAL_COMMUNITY): Payer: Self-pay

## 2024-09-30 ENCOUNTER — Other Ambulatory Visit: Payer: Self-pay

## 2024-09-30 ENCOUNTER — Encounter (HOSPITAL_COMMUNITY)
Admission: RE | Admit: 2024-09-30 | Discharge: 2024-09-30 | Disposition: A | Discharge status: Home / Self Care | Source: Ambulatory Visit | Attending: Hematology | Admitting: Hematology

## 2024-09-30 DIAGNOSIS — R160 Hepatomegaly, not elsewhere classified: Secondary | ICD-10-CM | POA: Insufficient documentation

## 2024-09-30 DIAGNOSIS — C7A012 Malignant carcinoid tumor of the ileum: Secondary | ICD-10-CM | POA: Insufficient documentation

## 2024-09-30 NOTE — Consult Note (Signed)
 Chief Complaint: Patient with metastatic well differentiated neuroendocrine tumor.   Evaluation for peptide receptor therapy -  Lu 177 DOTATATE (Lutathera).  Referring Physician(s):Feng    Patient Status: [facility] - Out-pt  History of Present Illness: Linda Villanueva is a 53 y.o. 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   -presented with upper abdominal pain with nausea and vomiting. CT 05/16/22 revealed: 2.9 cm mesenteric mass; multiple liver lesions; focal segment of hyperenhancing small bowel; findings suspicious for partial small bowel obstruction.  -S/p emergent resection on 05/18/22 by Dr. Sheldon. Small bowel mass pathology showed 1 cm well-differentiated NET, grade 1, infiltrating serosa, and an associated 3.2 cm mesenteric mass. Margins negative, 1/15 positive lymph nodes. Liver pathology confirmed metastatic NET. -Baseline chromogranin A on 05/17/22 elevated at 327.9, which has decreased on sandostatin   -24hr urine obtained 06/11/22 showed elevated 5-HIAA. -she began first-line sandostatin  injections 7/23, increased to full dose in 07/2022. -CT scan in September 2023 showed mild disease progression, she is clinically doing well. -restaging CT in 01/2024 showed stable disease   Past Medical History:  Diagnosis Date   met neuroendocrine carcinoid tumor of ileum to liver 07/2022   Vitamin B12 deficiency    Vitamin D  deficiency     Past Surgical History:  Procedure Laterality Date   INNER EAR SURGERY     LAPAROTOMY N/A 05/18/2022   Procedure: EXPLORATORY LAPAROTOMY; SMALL BOWEL RESECTION; WEDGE LIVER BIOPSY;  Surgeon: Sheldon Standing, MD;  Location: [facility];  Service: General;  Laterality: N/A;    Allergies: Patient has no known allergies.  Medications: Prior to Admission medications   Medication Sig Start Date End Date Taking? Authorizing Provider   amLODipine  (NORVASC ) 2.5 MG tablet Take 1 tablet (2.5 mg total) by mouth at bedtime. 07/01/23   Marlee Lynwood NOVAK, MD  cholecalciferol (VITAMIN D3) 25 MCG (1000 UNIT) tablet Take 1,000 Units by mouth daily.    [provider]  cyanocobalamin  (VITAMIN B12) 500 MCG tablet Take 500 mcg by mouth daily.    [provider]  dicyclomine  (BENTYL ) 20 MG tablet Take 1 tablet (20 mg total) by mouth every 6 (six) hours. 12/04/23   Stacia Glendia BRAVO, MD  octreotide  (SANDOSTATIN  LAR) 30 MG injection Inject 30 mg into the muscle every 28 (twenty-eight) days.    [provider]     Family History  Problem Relation Age of Onset   Hypertension Mother    CAD Mother    Hypertension Father    Thyroid  disease Sister    Colon cancer Other 33 - 65    Social History   Socioeconomic History   Marital status: Married    Spouse name: Not on file   Number of children: Not on file   Years of education: Not on file   Highest education level: Not on file  Occupational History   Not on file  Tobacco Use   Smoking status: Never   Smokeless tobacco: Never  Vaping Use   Vaping status: Never Used  Substance and Sexual Activity   Alcohol use: No   Drug use: No   Sexual activity: Yes    Partners: Male    Birth control/protection: I.U.D.  Other Topics Concern   Not on file  Social History Narrative   Family originally from [place]   Social Drivers of Health   Financial Resource Strain:  High Risk (06/11/2022)   Overall Financial Resource Strain (CARDIA)    Difficulty of Paying Living Expenses: Hard  Food Insecurity: No Food Insecurity (09/22/2024)   Hunger Vital Sign    Worried About Running Out of Food in the Last Year: Never true    Ran Out of Food in the Last Year: Never true  Transportation Needs: No Transportation Needs (09/22/2024)   PRAPARE - Administrator, Civil Service (Medical): No    Lack of Transportation (Non-Medical): No  Physical Activity: Not on file   Stress: Not on file  Social Connections: Not on file    ECOG Status: 1 - Symptomatic but completely ambulatory  Review of Systems: A 12 point ROS discussed and pertinent positives are indicated in the HPI above.  All other systems are negative.  Review of Systems  Mild facial flushing and abdominal pain.  Pain improved with Sandostatin  injections   Vital Signs: There were no vitals taken for this visit.  Physical Exam  Well appearing female in Arab dress.  Tearful at times.. Very pleasant   Imaging: Outside recent  DOTATATE PET reviewed from   Labs: No results for input(s): PSA, PSA1 in the last 8760 hours.  CBC: Recent Labs    12/18/23 1323 04/06/24 1045 06/30/24 1126 09/09/24 1345  WBC 7.0 5.4 9.4 7.4  HGB 12.6 11.7* 11.5* 12.3  HCT 38.4 35.3* 34.3* 36.7  PLT 330 333 327 351    COAGS: No results for input(s): INR, APTT in the last 8760 hours.  BMP: Recent Labs    12/18/23 1323 04/06/24 1045 06/30/24 1126 09/09/24 1345  NA 138 140 138 139  K 3.6 4.0 4.5 4.0  CL 105 106 105 107  CO2 25 27 26 26   GLUCOSE 97 107* 116* 94  BUN 12 9 11 14   CALCIUM  8.7* 8.8* 8.6* 9.1  CREATININE 0.53 0.55 0.60 0.55  GFRNONAA >60 >60 >60 >60    LIVER FUNCTION TESTS: Recent Labs    12/18/23 1323 04/06/24 1045 06/30/24 1126 09/09/24 1345  BILITOT 0.3 0.5 0.3 0.3  AST 14* 12* 15 12*  ALT 10 13 12 10   ALKPHOS 85 79 85 82  PROT 7.2 6.9 6.9 7.3  ALBUMIN 4.1 3.9 3.9 4.1    TUMOR MARKERS: Recent Labs    12/18/23 1323  CHROMOGA 156.5*    Assessment and Plan:  [Patient is a candidate for peptide receptor radiotherapy.  Patient has well differentiated neuroendocrine tumor identified within the liver.  The tumor is positive for DOTATATE/ somatostatin receptors.  Patient has mild symptoms which Daphnee be attributable to carcinoid tumor including abdominal pain and hot flashes.    The patient was counseled on the primary goal of therapy which is prolongation of  progression free survival (79% improvement over standard therapy).  Secondary goals would include decrease in tumor burden and decrease in carcinoid symptoms.    Primary of toxicities of therapy were explained to patient including marrow suppression, renal toxicity and hepatic toxicity.  Rare toxicity of myelosuppression and leukemia also explained.  Potential toxicity will be monitored throughout the course of therapy with interval  CBC and CMP laboratory evaluation.    Four therapies will be scheduled  2 months apart over a  six-month interval.  Patient will receive IM Sandostatin  injection in the molecular imaging department after each therapy.  Patient will return to oncology clinic 1 month following each therapy for CBC and CMP and IM Sandostatin  injection.   Thank you for  this interesting consult.  I greatly enjoyed meeting Linda Villanueva and look forward to participating in their care.  A copy of this report was sent to the requesting provider on this date.  Electronically Signed: Norleen GORMAN Boxer, MD 09/30/2024, 10:33 AM   I spent a total of  30 Minutes   in face to face in clinical consultation, greater than 50% of which was counseling/coordinating care for metastatic neuroendocrine tumor.

## 2024-10-20 ENCOUNTER — Inpatient Hospital Stay

## 2024-10-20 ENCOUNTER — Inpatient Hospital Stay: Attending: Hematology

## 2024-10-20 VITALS — BP 131/82 | HR 76 | Resp 18

## 2024-10-20 DIAGNOSIS — C7A019 Malignant carcinoid tumor of the small intestine, unspecified portion: Secondary | ICD-10-CM | POA: Diagnosis present

## 2024-10-20 DIAGNOSIS — Z79899 Other long term (current) drug therapy: Secondary | ICD-10-CM | POA: Insufficient documentation

## 2024-10-20 DIAGNOSIS — C7A012 Malignant carcinoid tumor of the ileum: Secondary | ICD-10-CM

## 2024-10-20 LAB — CBC WITH DIFFERENTIAL (CANCER CENTER ONLY)
Abs Immature Granulocytes: 0.01 K/uL (ref 0.00–0.07)
Basophils Absolute: 0.1 K/uL (ref 0.0–0.1)
Basophils Relative: 1 %
Eosinophils Absolute: 0.1 K/uL (ref 0.0–0.5)
Eosinophils Relative: 2 %
HCT: 35.9 % — ABNORMAL LOW (ref 36.0–46.0)
Hemoglobin: 11.6 g/dL — ABNORMAL LOW (ref 12.0–15.0)
Immature Granulocytes: 0 %
Lymphocytes Relative: 40 %
Lymphs Abs: 2.2 K/uL (ref 0.7–4.0)
MCH: 27.4 pg (ref 26.0–34.0)
MCHC: 32.3 g/dL (ref 30.0–36.0)
MCV: 84.9 fL (ref 80.0–100.0)
Monocytes Absolute: 0.3 K/uL (ref 0.1–1.0)
Monocytes Relative: 6 %
Neutro Abs: 2.8 K/uL (ref 1.7–7.7)
Neutrophils Relative %: 51 %
Platelet Count: 360 K/uL (ref 150–400)
RBC: 4.23 MIL/uL (ref 3.87–5.11)
RDW: 14.3 % (ref 11.5–15.5)
WBC Count: 5.4 K/uL (ref 4.0–10.5)
nRBC: 0 % (ref 0.0–0.2)

## 2024-10-20 LAB — CMP (CANCER CENTER ONLY)
ALT: 9 U/L (ref 0–44)
AST: 18 U/L (ref 15–41)
Albumin: 4.2 g/dL (ref 3.5–5.0)
Alkaline Phosphatase: 88 U/L (ref 38–126)
Anion gap: 11 (ref 5–15)
BUN: 10 mg/dL (ref 6–20)
CO2: 26 mmol/L (ref 22–32)
Calcium: 9.1 mg/dL (ref 8.9–10.3)
Chloride: 103 mmol/L (ref 98–111)
Creatinine: 0.57 mg/dL (ref 0.44–1.00)
GFR, Estimated: >60 mL/min (ref >60)
Glucose, Bld: 168 mg/dL — ABNORMAL HIGH (ref 70–99)
Potassium: 3.8 mmol/L (ref 3.5–5.1)
Sodium: 140 mmol/L (ref 135–145)
Total Bilirubin: 0.4 mg/dL (ref 0.0–1.2)
Total Protein: 7.4 g/dL (ref 6.5–8.1)

## 2024-10-20 MED ORDER — OCTREOTIDE ACETATE 30 MG IM KIT
30.0000 mg | PACK | Freq: Once | INTRAMUSCULAR | Status: AC
  Administered 2024-10-20: 30 mg via INTRAMUSCULAR
  Filled 2024-10-20: qty 1

## 2024-10-25 NOTE — Written Directive (Cosign Needed)
 LUTATHERA THERAPY   RADIOPHARMACEUTICAL:  Lutetium 177 Dotatate (Lutathera)     PRESCRIBED DOSE FOR ADMINISTRATION:  200 mCi   ROUTE OFADMINISTRATION:  IV   DIAGNOSIS:  malignat carcinoid tumor of ileum/Liver masses, Malignant carcinoid tumor of ileum (HCC), Liver masses    REFERRING PHYSICIAN:Feng, Onita, MD    TREATMENT #: 1   DATE OF LAST LONG LIVED SOMATOSTATIN INJECTION: octreotide  (SANDOSTATIN  LAR) IM injection 30 mg 30 mg 10/20/2024   ADDITIONAL PHYSICIAN COMMENTS/NOTES:   AUTHORIZED USER SIGNATURE & TIME STAMP:

## 2024-10-27 ENCOUNTER — Inpatient Hospital Stay (HOSPITAL_COMMUNITY)
Admission: RE | Admit: 2024-10-27 | Discharge: 2024-10-27 | Disposition: A | Source: Ambulatory Visit | Attending: Hematology

## 2024-10-27 DIAGNOSIS — R160 Hepatomegaly, not elsewhere classified: Secondary | ICD-10-CM | POA: Insufficient documentation

## 2024-10-27 DIAGNOSIS — C7A012 Malignant carcinoid tumor of the ileum: Secondary | ICD-10-CM | POA: Insufficient documentation

## 2024-10-27 DIAGNOSIS — C7A019 Malignant carcinoid tumor of the small intestine, unspecified portion: Secondary | ICD-10-CM | POA: Insufficient documentation

## 2024-10-27 MED ORDER — SODIUM CHLORIDE 0.9 % IV SOLN: 500.0000 mL | Freq: Once | INTRAVENOUS | Status: DC

## 2024-10-27 MED ORDER — SODIUM CHLORIDE 0.9 % IV SOLN
8.0000 mg | Freq: Once | INTRAVENOUS | Status: AC
  Administered 2024-10-27: 8 mg via INTRAVENOUS
  Filled 2024-10-27: qty 4

## 2024-10-27 MED ORDER — LUTETIUM LU 177 DOTATATE 370 MBQ/ML IV SOLN
200.0000 | Freq: Once | INTRAVENOUS | Status: AC
  Administered 2024-10-27: 199.059 via INTRAVENOUS

## 2024-10-27 MED ORDER — OCTREOTIDE ACETATE 500 MCG/ML IJ SOLN: 500.0000 ug | Freq: Once | INTRAMUSCULAR | Status: DC | PRN

## 2024-10-27 MED ORDER — OCTREOTIDE ACETATE 30 MG IM KIT: 30.0000 mg | PACK | Freq: Once | INTRAMUSCULAR | Status: DC

## 2024-10-27 MED ORDER — AMINO ACID RADIOPROTECTANT - L-LYSINE 2.5%/L-ARGININE 2.5% IN NS
250.0000 mL/h | INTRAVENOUS | Status: AC
  Administered 2024-10-27: 250 mL/h via INTRAVENOUS
  Filled 2024-10-27: qty 1000

## 2024-10-27 MED ORDER — ONDANSETRON HCL 8 MG PO TABS: 8.0000 mg | ORAL_TABLET | Freq: Two times a day (BID) | ORAL | 0 refills | Status: AC | PRN

## 2024-10-27 MED ORDER — PROCHLORPERAZINE EDISYLATE 10 MG/2ML IJ SOLN: 10.0000 mg | Freq: Four times a day (QID) | INTRAMUSCULAR | Status: DC | PRN

## 2024-10-27 NOTE — Progress Notes (Signed)
 Point of care pregnancy test negative.  Test results visualized by myself and Kimberlee Goods

## 2024-10-27 NOTE — Progress Notes (Signed)
 Tolerated ist Lutathera treatment well.  Dr. Malva sent her a Zofran  RX to her pharmacy incase she has nausea later

## 2024-10-27 NOTE — Progress Notes (Signed)
 EXAM: NUCLEAR MEDICINE LUTATHERA ADMINISTRATION 10/27/2024 02:50:34 PM TECHNIQUE: Infusion: The nuclear medicine technologist and I personally verified the dose activity (200 mCi) to be delivered as specified in the written directive (200 mCi), and verified the patient identification via 2 separate methods. 20 gauge IV were started in the antecubital veins. Anti-emetics were administered by nursing staff. Amino acid renal protection was initiated 30 minutes prior to Lu 177 DOTATATE (Lutathera) infusion and continued continuously for 4 hours. Lutathera infusion was administered over 30 minutes. The total administered dose was 199 mCi Lu 177 DOTATATE. The entire IV tubing, venocatheter, stopcock and syringes was removed in total, placed in a disposal bag and sent for assay of the residual activity, which will be reported at a later time in our EMR by the physics staff. Pressure was applied to the venipuncture sites, and a compression bandage placed. Radiation Safety personnel were present to perform the discharge survey, as detailed on their documentation. 30 mg Sandostatin  injection was deferred as patient received injection 10/21/1999. Recommend resumption of injections 1 month from this week and oncology visit for follow-up. RADIOPHARMACEUTICAL: 199 mCi Lu 177 DOTATATE COMPARISON: None available. CLINICAL HISTORY: malignat carcinoid tumor of ileum/Liver masses malignat carcinoid tumor of ileum/Liver masses Well differentiated tumor with somatostatin receptor is identified within the [facility] ] by DOTATATE PET CT scan. FINDINGS: Diagnosis: Metastatic neuroendocrine tumor. Current Infusion: 199 mCi Lu 177 DOTATATE. Planned Infusions: 4.  The patient's most recent blood counts were reviewed and remains a good candidate to proceed with Lutathera. The patient was situated in an infusion suite and administered Lutathera as above. Patient will follow-up with referring oncologist for interval  serum laboratories (CBC and CMP) in approximately 4 weeks. 30 mg Sandostatin  injection deferred as patient received injection on 10/21/1999. Recommend resumption of injections 1 month from this week and oncology visit for follow-up. IMPRESSION: First Lu 177 DOTATATE treatment for metastatic neuroendocrine tumor, infusion tolerated without complication. Resume 30 mg somatostatin analog injections in 1 month and arrange oncology follow-up.

## 2024-11-04 ENCOUNTER — Other Ambulatory Visit: Payer: Self-pay

## 2024-11-04 MED ORDER — AMLODIPINE BESYLATE 2.5 MG PO TABS: 2.5000 mg | ORAL_TABLET | Freq: Every day | ORAL | 0 refills | Status: AC

## 2024-11-17 ENCOUNTER — Inpatient Hospital Stay

## 2024-11-17 VITALS — BP 136/83 | HR 71 | Temp 98.1°F | Resp 16

## 2024-11-17 DIAGNOSIS — C7A019 Malignant carcinoid tumor of the small intestine, unspecified portion: Secondary | ICD-10-CM | POA: Diagnosis not present

## 2024-11-17 DIAGNOSIS — C7A012 Malignant carcinoid tumor of the ileum: Secondary | ICD-10-CM

## 2024-11-17 LAB — CMP (CANCER CENTER ONLY)
ALT: 13 U/L (ref 0–44)
AST: 20 U/L (ref 15–41)
Albumin: 4.4 g/dL (ref 3.5–5.0)
Alkaline Phosphatase: 86 U/L (ref 38–126)
Anion gap: 9 (ref 5–15)
BUN: 11 mg/dL (ref 6–20)
CO2: 27 mmol/L (ref 22–32)
Calcium: 9.4 mg/dL (ref 8.9–10.3)
Chloride: 101 mmol/L (ref 98–111)
Creatinine: 0.58 mg/dL (ref 0.44–1.00)
GFR, Estimated: >60 mL/min
Glucose, Bld: 120 mg/dL — ABNORMAL HIGH (ref 70–99)
Potassium: 4.4 mmol/L (ref 3.5–5.1)
Sodium: 137 mmol/L (ref 135–145)
Total Bilirubin: 0.4 mg/dL (ref 0.0–1.2)
Total Protein: 7.6 g/dL (ref 6.5–8.1)

## 2024-11-17 LAB — CBC WITH DIFFERENTIAL (CANCER CENTER ONLY)
Abs Immature Granulocytes: 0.02 K/uL (ref 0.00–0.07)
Basophils Absolute: 0.1 K/uL (ref 0.0–0.1)
Basophils Relative: 1 %
Eosinophils Absolute: 0.2 K/uL (ref 0.0–0.5)
Eosinophils Relative: 3 %
HCT: 36 % (ref 36.0–46.0)
Hemoglobin: 11.8 g/dL — ABNORMAL LOW (ref 12.0–15.0)
Immature Granulocytes: 0 %
Lymphocytes Relative: 30 %
Lymphs Abs: 1.6 K/uL (ref 0.7–4.0)
MCH: 28 pg (ref 26.0–34.0)
MCHC: 32.8 g/dL (ref 30.0–36.0)
MCV: 85.3 fL (ref 80.0–100.0)
Monocytes Absolute: 0.5 K/uL (ref 0.1–1.0)
Monocytes Relative: 9 %
Neutro Abs: 3 K/uL (ref 1.7–7.7)
Neutrophils Relative %: 57 %
Platelet Count: 317 K/uL (ref 150–400)
RBC: 4.22 MIL/uL (ref 3.87–5.11)
RDW: 14.6 % (ref 11.5–15.5)
WBC Count: 5.3 K/uL (ref 4.0–10.5)
nRBC: 0 % (ref 0.0–0.2)

## 2024-11-17 MED ORDER — OCTREOTIDE ACETATE 30 MG IM KIT
30.0000 mg | PACK | Freq: Once | INTRAMUSCULAR | Status: AC
  Administered 2024-11-17: 30 mg via INTRAMUSCULAR
  Filled 2024-11-17: qty 1

## 2024-11-23 ENCOUNTER — Encounter (HOSPITAL_COMMUNITY)

## 2024-11-23 ENCOUNTER — Inpatient Hospital Stay

## 2024-12-15 ENCOUNTER — Inpatient Hospital Stay: Attending: Hematology | Admitting: Hematology

## 2024-12-15 ENCOUNTER — Inpatient Hospital Stay: Attending: Hematology

## 2024-12-15 ENCOUNTER — Inpatient Hospital Stay

## 2024-12-15 VITALS — BP 120/80 | HR 82 | Temp 97.7°F | Resp 16 | Ht 67.0 in | Wt 170.1 lb

## 2024-12-15 DIAGNOSIS — C7A012 Malignant carcinoid tumor of the ileum: Secondary | ICD-10-CM

## 2024-12-15 LAB — CMP (CANCER CENTER ONLY)
ALT: 10 U/L (ref 0–44)
AST: 17 U/L (ref 15–41)
Albumin: 4.3 g/dL (ref 3.5–5.0)
Alkaline Phosphatase: 93 U/L (ref 38–126)
Anion gap: 11 (ref 5–15)
BUN: 11 mg/dL (ref 6–20)
CO2: 24 mmol/L (ref 22–32)
Calcium: 8.9 mg/dL (ref 8.9–10.3)
Chloride: 103 mmol/L (ref 98–111)
Creatinine: 0.58 mg/dL (ref 0.44–1.00)
GFR, Estimated: >60 mL/min
Glucose, Bld: 143 mg/dL — ABNORMAL HIGH (ref 70–99)
Potassium: 4.7 mmol/L (ref 3.5–5.1)
Sodium: 139 mmol/L (ref 135–145)
Total Bilirubin: 0.4 mg/dL (ref 0.0–1.2)
Total Protein: 7.5 g/dL (ref 6.5–8.1)

## 2024-12-15 LAB — CBC WITH DIFFERENTIAL (CANCER CENTER ONLY)
Abs Immature Granulocytes: 0.01 10*3/uL (ref 0.00–0.07)
Basophils Absolute: 0 10*3/uL (ref 0.0–0.1)
Basophils Relative: 1 %
Eosinophils Absolute: 0.1 10*3/uL (ref 0.0–0.5)
Eosinophils Relative: 2 %
HCT: 35.6 % — ABNORMAL LOW (ref 36.0–46.0)
Hemoglobin: 11.7 g/dL — ABNORMAL LOW (ref 12.0–15.0)
Immature Granulocytes: 0 %
Lymphocytes Relative: 35 %
Lymphs Abs: 1.6 10*3/uL (ref 0.7–4.0)
MCH: 27.8 pg (ref 26.0–34.0)
MCHC: 32.9 g/dL (ref 30.0–36.0)
MCV: 84.6 fL (ref 80.0–100.0)
Monocytes Absolute: 0.4 10*3/uL (ref 0.1–1.0)
Monocytes Relative: 8 %
Neutro Abs: 2.6 10*3/uL (ref 1.7–7.7)
Neutrophils Relative %: 54 %
Platelet Count: 281 10*3/uL (ref 150–400)
RBC: 4.21 MIL/uL (ref 3.87–5.11)
RDW: 14.5 % (ref 11.5–15.5)
WBC Count: 4.7 10*3/uL (ref 4.0–10.5)
nRBC: 0 % (ref 0.0–0.2)

## 2024-12-15 MED ORDER — OCTREOTIDE ACETATE 30 MG IM KIT: 30.0000 mg | PACK | Freq: Once | INTRAMUSCULAR | Status: DC

## 2024-12-15 NOTE — Progress Notes (Signed)
 Patient presented to clinic for her scheduled sandostatin  today but was held per Dr Lanny due to scheduled Lutathera  appt next week. Pt will be receiving sandostain next week per Dr Lanny.

## 2024-12-15 NOTE — Progress Notes (Signed)
 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   -presented with upper abdominal pain with nausea and vomiting. CT 05/16/22 revealed: 2.9 cm mesenteric mass; multiple liver lesions; focal segment of hyperenhancing small bowel; findings suspicious for partial small bowel obstruction.  -S/p emergent resection on 05/18/22 by Dr. Sheldon. Small bowel mass pathology showed 1 cm well-differentiated NET, grade 1, infiltrating serosa, and an associated 3.2 cm mesenteric mass. Margins negative, 1/15 positive lymph nodes. Liver pathology confirmed metastatic NET. -Baseline chromogranin A on 05/17/22 elevated at 327.9, which has decreased on sandostatin   -24hr urine obtained 06/11/22 showed elevated 5-HIAA. -she began first-line sandostatin  injections 7/23, increased to full dose in 07/2022. -CT scan in September 2023 showed mild disease progression, she is clinically doing well. 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 She completed the first dose with only mild fatigue and no significant adverse effects. Laboratory values, including leukocyte and hemoglobin levels, remain within normal limits. Ongoing monthly somatostatin analog injections are part of her  disease management. The primary goal is tumor control and potential pain reduction, though response is not guaranteed. There is a small risk of cytopenias, which will be monitored. - Scheduled next Lutathera  treatment for next week and subsequent doses per protocol. - Ordered laboratory studies prior to each treatment to monitor hematologic parameters. - Scheduled follow-up in eight weeks with laboratory evaluation prior to next treatment. - Planned review of renal function when available, with direct contact if abnormalities are detected. - Coordinated monthly somatostatin analog injection to be administered with Lutathera  treatment, or in my office in between treatments; confirmed no out-of-pocket cost.  Neoplasm-related pain Experiencing persistent pain related to metastatic carcinoid tumor, exacerbated by physical activity and prolonged standing, but not worsened since initiation of Lutathera . Pain is manageable with acetaminophen  as needed. Musculoskeletal tenderness and cysts are benign and unrelated to tumor or therapy. Pain Mayola improve with ongoing treatment. - Recommended acetaminophen  as needed for analgesia. - Discussed potential for pain improvement with continued Lutathera  therapy. - Provided reassurance regarding benign musculoskeletal findings unrelated to malignancy or treatment.  Plan - She tolerated first Lutathera  treatment very well, and is scheduled for second 1 next week. 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 Multiple mildly dilated fluid-filled loops of mid small bowel, suspicious for partial small bowel obstruction, poorly defined transition point. Some mesenteric vascular congestion but no intramural air or bowel wall thickening. 2. 2.9 cm enhancing central mesenteric mass. 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 Focal segment of hyperenhancing small bowel could be potential source/site of primary. 3. Small moderate volume free fluid in the pelvis 4. Mildly prominent common bile duct which Velisa be correlated with LFTs.    05/17/2022 Imaging   CT CHEST WO CONTRAST  IMPRESSION: Indeterminate solid 3 mm right upper lobe pulmonary nodule.   Multiple liver masses best seen on recent contrast enhanced CT of the abdomen and pelvis.   05/17/2022 Tumor Marker   Patient's tumor was tested for the following markers: Chromogranin A. Results of the tumor marker test revealed elevation at 327.9.   05/18/2022 Procedure   POST-OPERATIVE DIAGNOSIS:   JEJUNAL MASS - PARTIALLY OBSTRUCTING LIVER MASSES PROBABLE METASTATIC CARCINOID    PROCEDURE:   EXPLORATORY LAPAROTOMY SMALL BOWEL RESECTION WEDGE LIVER BIOPSY   SURGEON:  Elspeth KYM Schultze, MD   OR FINDINGS: 15 mm small bowel nodule at junction of jejunum and ileum causing partial obstruction.  Moderately bulky lymphadenopathy in the mesentery proximal to the nodule.  En bloc resection done.  230 cm of small intestine remaining from ligament of Treitz to ileocecal valve.  Numerous miliary 47mm-60 mm liver nodules.  Wedge resection done of right hepatic lobe just lateral to the gallbladder hepatic fossa.   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       05/18/2022 Pathology Results   SURGICAL PATHOLOGY  CASE: [id number]   PATIENT: [name]  Surgical Pathology Report   FINAL MICROSCOPIC DIAGNOSIS:   A. SMALL BOWEL, MASS, RESECTION:  Well-differentiated neuroendocrine tumor, G1  Tumor measures 1.0 x 0.9 x 0.5 cm  Tumor infiltrates into the serosa (pT3)  Margins free  One of 15 lymph nodes with metastatic tumor and an associated mesenteric  mass measuring 3.2 cm (pN2) (see comment within template)  Perineural invasion present   B. LIVER, WEDGE RESECTION:  Metastatic well differentiated neuroendocrine tumor, 5 foci (ranging  from 7 mm to 0.35 mm)   Pathologic Stage Classification (pTNM, AJCC 8th Edition): pT3, pN2    05/18/2022 Cancer Staging   Staging form: Small Intestine - Other Histologies, AJCC 8th Edition - Pathologic stage from 05/18/2022: pT3, pN2, cM1 - Signed by Lanny Callander, MD on 07/11/2022 Histologic grade (G): G1 Histologic grading system: 4 grade system   06/07/2022 Initial Diagnosis   Malignant carcinoid tumor of unknown primary site [employer])   07/2023 Imaging   CT chest abdomen and pelvis with contrast   IMPRESSION: CT CHEST IMPRESSION  1.  No acute process or evidence of metastatic disease in the chest. 2. 3 mm right upper lobe pulmonary nodule is unchanged and considered benign.  CT ABDOMEN AND PELVIS IMPRESSION  1. Although the extent of liver metastasis is suboptimally evaluated given lack of arterial phase imaging, mild response to therapy is identified. 2. Surgical changes within the small bowel. Mild small bowel mesenteric adenopathy, similar. 3. Right ovarian corpus luteal cyst      Discussed the use of AI scribe software for clinical note transcription with the patient, who gave verbal consent to proceed.  History of Present Illness Linda Villanueva is a 54 year old female with metastatic ileal neuroendocrine tumor who presents for follow-up after her first  cycle of Lutathera  therapy.  She tolerated the first Lutathera  dose well with only mild fatigue. She has persistent  tumor-related pain that is unchanged from baseline. The pain is worsened by walking, prolonged standing, bending, and household tasks, persists overnight and into the morning, and is present but manageable while sitting. She prefers to avoid analgesics and uses acetaminophen  only occasionally.  Prior to Lutathera  she had increasing pain and worry about progression. She continues to have significant emotional distress about her cancer and her ability to care for her children and being separated from family.  She had a transient increase in appetite with about 5 lb weight gain over roughly 10 days, now resolved with return to usual intake. She is trying to reduce carbohydrates. She reports no new systemic or focal symptoms concerning for toxicity or progression.  She notes a small, asymptomatic cyst near a tendon that predated Lutathera  and has not changed. She confirms that blood work is obtained before each Lutathera  treatment per protocol.     All other systems were reviewed with the patient and are negative.  MEDICAL HISTORY:  Past Medical History:  Diagnosis Date   met neuroendocrine carcinoid tumor of ileum to liver 07/2022   Vitamin B12 deficiency    Vitamin D  deficiency     SURGICAL HISTORY: Past Surgical History:  Procedure Laterality Date   INNER EAR SURGERY     LAPAROTOMY N/A 05/18/2022   Procedure: EXPLORATORY LAPAROTOMY; SMALL BOWEL RESECTION; WEDGE LIVER BIOPSY;  Surgeon: Sheldon Standing, MD;  Location: [facility];  Service: General;  Laterality: N/A;    I have reviewed the social history and family history with the patient and they are unchanged from previous note.  ALLERGIES:  has no known allergies.  MEDICATIONS:  Current Outpatient Medications  Medication Sig Dispense Refill   amLODipine  (NORVASC ) 2.5 MG tablet Take 1 tablet (2.5 mg total) by mouth at bedtime. 90 tablet 0   cholecalciferol (VITAMIN D3) 25 MCG (1000 UNIT) tablet Take 1,000 Units by mouth daily.      cyanocobalamin  (VITAMIN B12) 500 MCG tablet Take 500 mcg by mouth daily.     dicyclomine  (BENTYL ) 20 MG tablet Take 1 tablet (20 mg total) by mouth every 6 (six) hours. 30 tablet 1   octreotide  (SANDOSTATIN  LAR) 30 MG injection Inject 30 mg into the muscle every 28 (twenty-eight) days.     ondansetron  (ZOFRAN ) 8 MG tablet Take 1 tablet (8 mg total) by mouth 2 (two) times daily as needed for nausea or vomiting. 20 tablet 0   No current facility-administered medications for this visit.    PHYSICAL EXAMINATION: ECOG PERFORMANCE STATUS: 1 - Symptomatic but completely ambulatory  Vitals:   12/15/24 1329  BP: 120/80  Pulse: 82  Resp: 16  Temp: 97.7 F (36.5 C)  SpO2: 99%   Wt Readings from Last 3 Encounters:  12/15/24 170 lb 1.6 oz (77.2 kg)  09/22/24 166 lb 12.8 oz (75.7 kg)  06/30/24 165 lb 12.8 oz (75.2 kg)     GENERAL:alert, no distress and comfortable SKIN: skin color, texture, turgor are normal, no rashes or significant lesions EYES: normal, Conjunctiva are pink and non-injected, sclera clear NECK: supple, thyroid  normal size, non-tender, without nodularity LYMPH:  no palpable lymphadenopathy in the cervical, axillary  LUNGS: clear to auscultation and percussion with normal breathing effort HEART: regular rate & rhythm and no murmurs and no lower extremity edema ABDOMEN:abdomen soft, non-tender and normal bowel sounds Musculoskeletal:no cyanosis of digits and no clubbing  NEURO:  alert & oriented x 3 with fluent speech, no focal motor/sensory deficits  Physical Exam MEASUREMENTS: BMI- 26.0.  LABORATORY DATA:  I have reviewed the data as listed    Latest Ref Rng & Units 12/15/2024    1:16 PM 11/17/2024    2:38 PM 10/20/2024    2:16 PM  CBC  WBC 4.0 - 10.5 K/uL 4.7  5.3  5.4   Hemoglobin 12.0 - 15.0 g/dL 88.2  88.1  88.3   Hematocrit 36.0 - 46.0 % 35.6  36.0  35.9   Platelets 150 - 400 K/uL 281  317  360         Latest Ref Rng & Units 12/15/2024    1:16 PM  11/17/2024    2:38 PM 10/20/2024    2:16 PM  CMP  Glucose 70 - 99 mg/dL 856  879  831   BUN 6 - 20 mg/dL 11  11  10    Creatinine 0.44 - 1.00 mg/dL 9.41  9.41  9.42   Sodium 135 - 145 mmol/L 139  137  140   Potassium 3.5 - 5.1 mmol/L 4.7  4.4  3.8   Chloride 98 - 111 mmol/L 103  101  103   CO2 22 - 32 mmol/L 24  27  26    Calcium  8.9 - 10.3 mg/dL 8.9  9.4  9.1   Total Protein 6.5 - 8.1 g/dL 7.5  7.6  7.4   Total Bilirubin 0.0 - 1.2 mg/dL 0.4  0.4  0.4   Alkaline Phos 38 - 126 U/L 93  86  88   AST 15 - 41 U/L 17  20  18    ALT 0 - 44 U/L 10  13  9        RADIOGRAPHIC STUDIES: I have personally reviewed the radiological images as listed and agreed with the findings in the report. No results found.    No orders of the defined types were placed in this encounter.  All questions were answered. The patient knows to call the clinic with any problems, questions or concerns. No barriers to learning was detected. The total time spent in the appointment was 25 minutes, including review of chart and various tests results, discussions about plan of care and coordination of care plan     Onita Mattock, MD 12/15/2024     "

## 2024-12-15 NOTE — Assessment & Plan Note (Signed)
 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   -presented with upper abdominal pain with nausea and vomiting. CT 05/16/22 revealed: 2.9 cm mesenteric mass; multiple liver lesions; focal segment of hyperenhancing small bowel; findings suspicious for partial small bowel obstruction.  -S/p emergent resection on 05/18/22 by Dr. Sheldon. Small bowel mass pathology showed 1 cm well-differentiated NET, grade 1, infiltrating serosa, and an associated 3.2 cm mesenteric mass. Margins negative, 1/15 positive lymph nodes. Liver pathology confirmed metastatic NET. -Baseline chromogranin A on 05/17/22 elevated at 327.9, which has decreased on sandostatin   -24hr urine obtained 06/11/22 showed elevated 5-HIAA. -she began first-line sandostatin  injections 7/23, increased to full dose in 07/2022. -CT scan in September 2023 showed mild disease progression, she is clinically doing well. -restaging CT in 01/2024 showed stable disease  -due to worsening abdominal pain, she started Lutathera  therapy in 10/2024

## 2024-12-21 ENCOUNTER — Encounter (HOSPITAL_COMMUNITY): Admission: RE | Admit: 2024-12-21 | Source: Ambulatory Visit

## 2024-12-21 DIAGNOSIS — C7A012 Malignant carcinoid tumor of the ileum: Secondary | ICD-10-CM

## 2024-12-21 DIAGNOSIS — R160 Hepatomegaly, not elsewhere classified: Secondary | ICD-10-CM

## 2024-12-21 MED ORDER — SODIUM CHLORIDE 0.9 % IV SOLN
500.0000 mL | Freq: Once | INTRAVENOUS | Status: AC
  Administered 2024-12-21: 500 mL via INTRAVENOUS

## 2024-12-21 MED ORDER — AMINO ACID RADIOPROTECTANT - L-LYSINE 2.5%/L-ARGININE 2.5% IN NS
250.0000 mL/h | INTRAVENOUS | Status: AC
  Administered 2024-12-21: 250 mL/h via INTRAVENOUS
  Filled 2024-12-21: qty 1000

## 2024-12-21 MED ORDER — PROCHLORPERAZINE EDISYLATE 10 MG/2ML IJ SOLN: 10.0000 mg | Freq: Four times a day (QID) | INTRAMUSCULAR | Status: AC | PRN

## 2024-12-21 MED ORDER — OCTREOTIDE ACETATE 500 MCG/ML IJ SOLN: 500.0000 ug | Freq: Once | INTRAMUSCULAR | Status: AC | PRN

## 2024-12-21 MED ORDER — LUTETIUM LU 177 DOTATATE 370 MBQ/ML IV SOLN
200.0000 | Freq: Once | INTRAVENOUS | Status: AC
  Administered 2024-12-21: 198.97 via INTRAVENOUS

## 2024-12-21 MED ORDER — OCTREOTIDE ACETATE 30 MG IM KIT
PACK | INTRAMUSCULAR | Status: AC
  Filled 2024-12-21: qty 1

## 2024-12-21 MED ORDER — ONDANSETRON 8 MG/NS 50 ML IVPB
8.0000 mg | Freq: Once | INTRAVENOUS | Status: AC
  Administered 2024-12-21: 8 mg via INTRAVENOUS
  Filled 2024-12-21: qty 8

## 2024-12-21 MED ORDER — OCTREOTIDE ACETATE 30 MG IM KIT
30.0000 mg | PACK | Freq: Once | INTRAMUSCULAR | Status: AC
  Administered 2024-12-21: 30 mg via INTRAMUSCULAR

## 2024-12-21 NOTE — Progress Notes (Signed)
 Point of care pregnancy test completed.  Results negative.

## 2024-12-21 NOTE — Progress Notes (Signed)
 EXAM: NUCLEAR MEDICINE LUTATHERA  ADMINISTRATION 12/21/2024 01:10:54 PM TECHNIQUE: Infusion: The nuclear medicine technologist and I personally verified the dose activity (199 mCi) to be delivered as specified in the written directive (200 mCi), and verified the patient identification via 2 separate methods. 20 gauge IV were started in the antecubital veins. Anti-emetics were administered by nursing staff. Amino acid renal protection was initiated 30 minutes prior to Lu 177 DOTATATE (Lutathera ) infusion and continued continuously for 4 hours. Lutathera  infusion was administered over 30 minutes. The total administered dose was 198 mCi Lu 177 DOTATATE. The entire IV tubing, venocatheter, stopcock and syringes was removed in total, placed in a disposal bag and sent for assay of the residual activity, which will be reported at a later time in our EMR by the physics staff. Pressure was applied to the venipuncture sites, and a compression bandage placed. Radiation Safety personnel were present to perform the discharge survey, as detailed on their documentation. Patient received 30 mg IM long-acting Sandostatin  injection 4 hours after Lutathera  effusion in the nuclear medicine department. RADIOPHARMACEUTICAL: 198 mCi Lu 177 DOTATATE COMPARISON: PET scan 12/03/2023. CLINICAL HISTORY: 54 year old female with metastatic neuroendocrine tumor. Well differentiated tumor with somatostatin receptor is identified within the []  by DOTATATE PET CT scan. FINDINGS: Diagnosis: Metastatic neuroendocrine tumor. Current Infusion: Second treatment with 198 mCi Lu 177 DOTATATE. Planned Infusions: 4 Patient reports no interval adverse events. Patient has reduced pain associated with liver metastasis. Patient reports reduced diarrhea. Patient overall appears well and less nervous than initial therapy. CBC and metabolic panel are within range for retreatment, with normal renal function and no  myelosuppression. The patient was situated in an infusion suite and administered Lutathera  as above. Patient will follow-up with referring oncologist for interval serum laboratories (CBC and CMP) in approximately 4 weeks. Patient received 30 mg IM long-acting Sandostatin  injection 4 hours after Lutathera  effusion in the nuclear medicine department. IMPRESSION: Second Lu 177 DOTATATE treatment for metastatic neuroendocrine tumor. Patient tolerated the infusion well with no interval adverse events. Patient reports reduced pain associated with liver metastasis and reduced diarrhea.

## 2024-12-21 NOTE — Written Directive (Addendum)
 LUTATHERA  THERAPY   RADIOPHARMACEUTICAL:  Lutetium 177 Dotatate (Lutathera )     PRESCRIBED DOSE FOR ADMINISTRATION:  200 mCi   ROUTE OFADMINISTRATION:  IV   DIAGNOSIS:  Metastatic neuroendocrine tumor    REFERRING PHYSICIAN: Dr Lanny    TREATMENT #: 2     DATE OF LAST LONG LIVED SOMATOSTATIN INJECTION: 11/17/2024    ADDITIONAL PHYSICIAN COMMENTS/NOTES:   AUTHORIZED USER SIGNATURE & TIME STAMP: Norleen GORMAN Boxer, MD   12/21/24    8:22 AM

## 2025-01-18 ENCOUNTER — Other Ambulatory Visit (HOSPITAL_COMMUNITY)

## 2025-01-19 ENCOUNTER — Inpatient Hospital Stay: Attending: Hematology

## 2025-02-10 ENCOUNTER — Inpatient Hospital Stay

## 2025-02-10 ENCOUNTER — Inpatient Hospital Stay: Admitting: Hematology

## 2025-02-15 ENCOUNTER — Other Ambulatory Visit (HOSPITAL_COMMUNITY)

## 2025-03-15 ENCOUNTER — Other Ambulatory Visit (HOSPITAL_COMMUNITY)

## 2025-04-12 ENCOUNTER — Other Ambulatory Visit (HOSPITAL_COMMUNITY)

## 2025-05-10 ENCOUNTER — Other Ambulatory Visit (HOSPITAL_COMMUNITY)
